# Patient Record
Sex: Male | Born: 1935 | Hispanic: No | Marital: Married | Smoking: Former smoker
Health system: Southern US, Community
[De-identification: ages and names within clinical notes are randomized; demographics above are authoritative.]

## PROBLEM LIST (undated history)

## (undated) DIAGNOSIS — R569 Unspecified convulsions: Secondary | ICD-10-CM

## (undated) DIAGNOSIS — F329 Major depressive disorder, single episode, unspecified: Secondary | ICD-10-CM

## (undated) DIAGNOSIS — I2510 Atherosclerotic heart disease of native coronary artery without angina pectoris: Secondary | ICD-10-CM

## (undated) DIAGNOSIS — F32A Depression, unspecified: Secondary | ICD-10-CM

## (undated) DIAGNOSIS — I639 Cerebral infarction, unspecified: Secondary | ICD-10-CM

## (undated) DIAGNOSIS — F0150 Vascular dementia without behavioral disturbance: Secondary | ICD-10-CM

## (undated) DIAGNOSIS — E119 Type 2 diabetes mellitus without complications: Secondary | ICD-10-CM

## (undated) HISTORY — PX: CARDIAC SURGERY: SHX584

---

## 2005-05-24 ENCOUNTER — Ambulatory Visit: Payer: Self-pay | Admitting: Internal Medicine

## 2005-06-03 ENCOUNTER — Ambulatory Visit: Payer: Self-pay | Admitting: Internal Medicine

## 2005-07-01 ENCOUNTER — Ambulatory Visit: Payer: Self-pay | Admitting: Internal Medicine

## 2006-06-22 ENCOUNTER — Ambulatory Visit: Payer: Self-pay | Admitting: Internal Medicine

## 2006-07-08 ENCOUNTER — Other Ambulatory Visit: Payer: Self-pay

## 2006-07-08 ENCOUNTER — Ambulatory Visit: Payer: Self-pay | Admitting: Cardiology

## 2006-07-29 ENCOUNTER — Ambulatory Visit: Payer: Self-pay | Admitting: Internal Medicine

## 2006-08-15 ENCOUNTER — Ambulatory Visit: Payer: Self-pay | Admitting: Internal Medicine

## 2006-09-08 ENCOUNTER — Ambulatory Visit: Payer: Self-pay

## 2006-09-15 ENCOUNTER — Ambulatory Visit: Payer: Self-pay | Admitting: Internal Medicine

## 2006-09-19 ENCOUNTER — Ambulatory Visit: Payer: Self-pay | Admitting: Unknown Physician Specialty

## 2006-11-11 ENCOUNTER — Ambulatory Visit: Payer: Self-pay | Admitting: Gastroenterology

## 2007-06-20 ENCOUNTER — Ambulatory Visit: Payer: Self-pay | Admitting: Internal Medicine

## 2007-10-22 ENCOUNTER — Inpatient Hospital Stay (HOSPITAL_COMMUNITY): Admission: EM | Admit: 2007-10-22 | Discharge: 2007-10-25 | Payer: Self-pay | Admitting: Emergency Medicine

## 2007-10-22 ENCOUNTER — Encounter (INDEPENDENT_AMBULATORY_CARE_PROVIDER_SITE_OTHER): Payer: Self-pay | Admitting: Neurology

## 2007-10-23 ENCOUNTER — Encounter (INDEPENDENT_AMBULATORY_CARE_PROVIDER_SITE_OTHER): Payer: Self-pay | Admitting: Neurology

## 2007-10-24 ENCOUNTER — Ambulatory Visit: Payer: Self-pay | Admitting: Physical Medicine & Rehabilitation

## 2007-10-25 ENCOUNTER — Inpatient Hospital Stay (HOSPITAL_COMMUNITY)
Admission: RE | Admit: 2007-10-25 | Discharge: 2007-11-15 | Payer: Self-pay | Admitting: Physical Medicine & Rehabilitation

## 2007-10-25 ENCOUNTER — Ambulatory Visit: Payer: Self-pay | Admitting: Cardiovascular Disease

## 2007-12-15 ENCOUNTER — Encounter
Admission: RE | Admit: 2007-12-15 | Discharge: 2007-12-18 | Payer: Self-pay | Admitting: Physical Medicine & Rehabilitation

## 2007-12-18 ENCOUNTER — Ambulatory Visit: Payer: Self-pay | Admitting: Physical Medicine & Rehabilitation

## 2008-12-25 ENCOUNTER — Encounter: Payer: Self-pay | Admitting: Neurology

## 2009-01-01 ENCOUNTER — Encounter: Payer: Self-pay | Admitting: Neurology

## 2009-01-31 ENCOUNTER — Encounter: Payer: Self-pay | Admitting: Neurology

## 2009-03-03 ENCOUNTER — Encounter: Payer: Self-pay | Admitting: Neurology

## 2009-04-02 ENCOUNTER — Encounter: Payer: Self-pay | Admitting: Neurology

## 2009-05-03 ENCOUNTER — Encounter: Payer: Self-pay | Admitting: Neurology

## 2009-06-03 ENCOUNTER — Encounter: Payer: Self-pay | Admitting: Neurology

## 2009-07-01 ENCOUNTER — Encounter: Payer: Self-pay | Admitting: Neurology

## 2010-07-27 ENCOUNTER — Emergency Department: Payer: Self-pay | Admitting: Emergency Medicine

## 2010-09-15 NOTE — Assessment & Plan Note (Signed)
[facility]   NAME:  Chad [name], Chad [name]                ACCOUNT NO.:  [id number]   MEDICAL RECORD NO.:  [id number]      DATE OF BIRTH:  03/03/1934   PHYSICIAN:  Noralyn Pick. Eden Emms, MD, [facility]    VISIT DATE:                                   OFFICE VISIT   Transesophageal electrocardiogram.   INDICATIONS:  A 77 year old patient with TIA, CVA, rule out source of  embolus.   The patient was sedated with 6 mg of Versed and 25 mcg of fentanyl.   Using digital technique and Omniplane, probe was advanced in the distal  esophagus without incident.   Transgastric imaging was revealed normal LV function with an EF of 60%.  There is moderate  LVH.   Mitral valve was mildly thickened with  trivial mitral insufficiency.  Aortic valve was trileaflet without significant disease.  There is no  aortic stenosis. Left atrial appendage was well visualized.  There is no  source of embolus.  There was some left ventricular hypertrophy with  atrial septum.   Right-sided cardiac chambers were normal.  A bubble study was negative  for right or left shunt.   Imaging of the aorta showed no significant debris.   FINAL IMPRESSION:  1. No source of embolus.  2. Negative blood loss study with no right or left shunt.  3. No left atrial appendage thrombus.  4. Moderate left ventricular hypertrophy, ejection fraction 60%.  5. Mild aortic valve sclerosis.  6. Trivial mitral insufficiency.      Noralyn Pick. Eden Emms, MD, [facility]  Electronically Signed     PCN/MEDQ  D:  10/25/2007  T:  10/26/2007  Job:  [id number]

## 2010-09-15 NOTE — Discharge Summary (Signed)
NAMEWINTON, Chad [name]                ACCOUNT NO.:  [id number]   MEDICAL RECORD NO.:  [id number]          PATIENT TYPE:  INP   LOCATION:  3013                         FACILITY:  [facility]   PHYSICIAN:  Pramod P. Pearlean Brownie, MD    DATE OF BIRTH:  1933/09/04   DATE OF ADMISSION:  10/22/2007  DATE OF DISCHARGE:  10/25/2007                               DISCHARGE SUMMARY   DISCHARGE DIAGNOSES:  1. 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  2. Dyslipidemia.  3. Diabetes.  4. Hypertension.  5. Mild obesity.   DISCHARGE MEDICATIONS:  1. Finasteride 5 mg a day.  2. Lovastatin 40 mg a day.  3. Metformin 500 mg b.i.d.  4. BuSpar 10 mg b.i.d.  5. Effexor 75 mg b.i.d.  6. Plavix 75 mg a day.  7. Lovenox 40 mg subcu a day.   STUDIES PERFORMED:  1. CT of the brain on admission shows no acute abnormality.  2. Chest x-ray shows no acute cardiopulmonary findings, borderline      cardiac enlargement, prominent paratracheal soft tissue density      bilaterally, likely ectatic vasculature.  3. MRI of the brain shows acute to partially hemorrhagic      transformation, right periopercular infarct with extension into the      right posterior frontal parietal lobe, small area of hemorrhage      right frontal lobe separated from the infarct, small vessel disease      type changes.  4. MRA of the head shows decreased visualization of the right middle      cerebral artery branch vessels consistent with acute infarct,      irregularity of intracranial vasculature consistent with      intracranial atherosclerotic type changes.  5. Followup CT of the head on the 22nd shows nonhemorrhagic infarct in      the right frontal parietal region as described with slight edema      and minimal mass effect on the right lateral ventricle.  No visible      hemorrhage or new infarcts.  6. Carotid Doppler shows bilateral 40%-60% ICA stenosis  by velocity      only.  A 2-D echocardiogram shows EF of 65%-70% with no obvious      source of embolus.  7. Transesophageal echocardiogram to be done on day of discharge,      results pending.  8. EKG, not present in chart.   LABORATORY STUDIES:  Hemoglobin 12.3, hematocrit 36.8, cholesterol 128,  triglycerides 39, HDL 35, LDL 85.  Urinalysis with 3-6 white blood  cells, 11-20 red blood cells, moderate leukocyte esterase.  Urine  culture, no growth.  Homocystine 14.6.  Urine drug screen negative.  Alcohol level, less than 10.  INR on admission 0.9.  Chemistry with  glucose 189, otherwise normal.  Liver function tests normal.  Hemoglobin  A1c 6.8.   HISTORY OF PRESENT ILLNESS:  [name] is a 77 year old right-  handed Caucasian male who states he laid down  at 3 p.m. on the day of  admission not feeling well.  He was aware about exact details and stated  that he was not able to sleep.  He laid on his bed for an hour and did  not talk to anyone.  At 4 p.m. when he tried to get up, he fell down and  noted he was weak on his left side.  He subsequently bumped his forehead  and tried getting up again this time falling and hitting the back of his  head.  He clearly had slurred speech and left-sided weakness.  He was  brought by EMS to [facility] where a code stroke was called en route.  Since arrival, he underwent a CT of the brain which showed no acute  abnormalities.  TPA was discussed with wife as it was beyond the 3-hour  window.  After due consideration, we will give IV tPA with the knowledge  that the patient does have 2 small scalp hematomas but it is not felt he  would likely break from that.  He was given full-dose IV tPA and  admitted to the neurointensive care for further evaluation.   HOSPITAL COURSE:  The patient remained stable throughout 24 hours in ICU  and was then transferred to the floor.  Of note, 24-hour post CT after  tPA showed no hemorrhage, though there was  a small amount of hemorrhagic  transformation seen on gradient echo on MRI.  As no hemorrhage was seen  on CT, the patient was started on Plavix for secondary stroke  prevention.  The patient also has a abnormal UA on admission, but urine  culture was negative.  He has vascular risk factors of hypertension,  diabetes, and dyslipidemia, all which are under good control.  He was  able to tolerate a dysphagia through thin liquid diet.  He did have some  significant left hemiparesis requiring rehab.  PT, OT, and speech  recommended inpatient rehab and arrangements were made.   CONDITION ON DISCHARGE:  The patient was alert and oriented x3.  No  aphasia, mild dysarthria, left facial weakness, left upper extremity  flaccid plegia, left lower extremity 3/5 decreased sensation on the  left.   DISCHARGE PLAN:  1. Transfer to rehab for continuation of PT, OT, and speech therapy.  2. TEE the day of discharge for follow up results.  We will likely      need outpatient TCD and emboli monitoring and Doppler studies at      office after discharge as well as outpatient cardiac monitoring by      cardiologist.  3. Follow up primary care physician within 1 month after discharge.      Please arrange cardiac monitoring with cardiologist as recommended.  4. Follow up Dr. Delia Heady in his office in 2-3 months.  5. Continue Plavix for secondary stroke prevention.      Annie Main, N.P.    ______________________________  Sunny Schlein. Pearlean Brownie, MD    SB/MEDQ  D:  10/25/2007  T:  10/26/2007  Job:  [id number]   cc:   Pramod P. Pearlean Brownie, MD  Daniel Nones, M.D.

## 2010-09-15 NOTE — H&P (Signed)
Chad [name], Chad [name]                ACCOUNT NO.:  [id number]   MEDICAL RECORD NO.:  [id number]          PATIENT TYPE:  INP   LOCATION:  3103                         FACILITY:  [facility]   PHYSICIAN:  Pramod P. Pearlean Brownie, MD    DATE OF BIRTH:  Dec 16, 1933   DATE OF ADMISSION:  10/22/2007  DATE OF DISCHARGE:                              HISTORY & PHYSICAL   REFERRING PHYSICIAN:  Devoria Albe, MD   REASON FOR REFERRAL:  Code stroke.   HISTORY OF PRESENT ILLNESS:  Chad [name] is a 77 year old pleasant  Caucasian male, who states he laid down at around 3 p.m. today, as he  was not feeling well.  He was vague about exact details, but stated that  he was not able to sleep because he was not well.  He lay on his bed for  an hour, did not talk to anybody.  At 4 o'clock when he tried to get up,  he fell down and noted he was weak on the left side.  He bumped his  forehead and subsequently, he tried getting up again, this time fell on  the back of his head.  He clearly had slurred speech and left-sided  weakness.  He was brought by EMS to [facility] where a code  stroke was called en route.  Since arrival, he has undergone a CT scan  of head, which has been unremarkable.  His slurred speech and left-sided  weakness have persisted.  He has no known prior history of stroke, TIA,  seizures, or significant neurological problems.   PAST MEDICAL HISTORY:  1. Diabetes.  2. Hyperlipidemia.  3. Hypertension.   HOME MEDICATIONS:  Include Mevacor, Proscar, BuSpar, Effexor,  Glucophage, and aspirin.   PAST SURGICAL HISTORY:  None.   MEDICATION ALLERGIES:  None.   SOCIAL HISTORY:  The patient is retired, lives with the relatives.  Does  not smoke or drink.   PRIMARY PHYSICIAN:  Curtis Sites, MD in [place].   REVIEW OF SYSTEMS:  Negative for recent fever, cough, chest pain,  diarrhea, or illness.   PHYSICAL EXAMINATION:  GENERAL:  Pleasant, elderly Caucasian gentleman,  who is not in  distress.  Afebrile.  VITAL SIGNS:  Pulse rate is 102 per minute, regular; blood pressure is a  162/110; respiratory rate is 18 per minute; and sats are 99% on 2 L.  Distal pulses are well felt.  HEENT:  Head exam reveals a small scalp hematoma on the right frontal  region as well as left occipital region.  There is no active bleeding at  the sight.  CARDIAC:  Regular heart sounds.  No murmur or gallop.  LUNGS:  Clear to auscultation.  ABDOMEN:  Soft and nontender.  NEUROLOGIC:  The patient is awake, alert, and oriented to time, place  and person.  There is no aphasia.  He has marked dysarthria, but it can  be understood.  He has significant left lower facial weakness.  His  tongue is midline.  Motor system exam reveals no upper extremity drift;  however, he has significant weakness of left  grip and intrinsic hand  muscles as well as left finger-to-nose dysmetria.  He has a mild left  lower extremity drift.  He has preserved touch and pinprick sensation,  but has a mild sensory extinction on the left.  His gait was not tested.   LABORATORY DATA:  Reviewed noncontrast CAT scan on the head, appears  unremarkable without any early signs of stroke or hemorrhage.  Basic  metabolic panel lab is significant only for glucose of 172.   IMPRESSION:  A 77 year old gentleman with sudden onset of left-sided  weakness and slurred speech, exact time of onset sometime between 3 and  4, but still within 3 to 4-1/2 hours from his ictus.  He will qualify  for off label use of IV tPA within the 3 to 4-hour time frame.  He does  have significant neurological deficit to justify using the medication.   PLAN:  I had a long discussion with the patient, his wife, and family  members with regards to the risk and benefit of giving IV tPA off label  in the 3 to 4-hour time frame as per the recent American Stroke  Association guidelines.  I explained to them, the patient does have 7%  risk of intracerebral  hemorrhage, but if he does not get better, he is  likely to have persistent disability.  The family after due  consideration has decided to go ahead with IV tPA.  The patient does  have two small scalp hematomas, but I do not think it is likely to bleed  from that.  We have explained this clearly to the family and they are  still in agreement.  We will give IV tPA full dose and admit the patient  to the Neurologic Intensive Care Unit where his blood pressure will be  tightly controlled as per post tPA protocol.  The patient is critically  ill, at significant risk for worsening, and his care requires complex  decision-making and monitoring of hemodynamic and neurological status.  I spent 1-hour of critical care time in the patient's care, treated the  patient.  We will get MRI scan of the brain, telemetry monitoring,  carotid Doppler, 2-D echocardiogram, fasting lipid profile, hemoglobin  A1c, and homocysteine.  Physical, occupational and speech therapy  consults, and rehab consults if necessary.           ______________________________  Sunny Schlein. Pearlean Brownie, MD     PPS/MEDQ  D:  10/22/2007  T:  10/23/2007  Job:  [id number]   cc:   Curtis Sites, MD

## 2010-09-15 NOTE — H&P (Signed)
NAMEMarland Kitchen  DEQUANN, VANDERVELDEN NO.:  [id number]   MEDICAL RECORD NO.:  [id number]          PATIENT TYPE:  IPS   LOCATION:  4025                         FACILITY:  [facility]   PHYSICIAN:  Ranelle Oyster, M.D.DATE OF BIRTH:  1934/01/10   DATE OF ADMISSION:  10/25/2007  DATE OF DISCHARGE:                              HISTORY & PHYSICAL   CHIEF COMPLAINT:  Left-sided weakness.   HISTORY OF PRESENT ILLNESS:  This is a 77 year old white male with  diabetes and BPH, admitted on October 22, 2007, with problems walking and a  fall.  He also developed left-sided weakness and slurred speech.  Head  CT was without acute changes, and the patient was treated with TPA for a  right subcortical infarct.  MRI/MRA of the brain showed an acute  infarct, partially hemorrhagic in right frontal-parietal lobe and  intracranial atherosclerosis.  The patient had some interval worsening  of his left hemiparesis due to hemorrhagic transformation. Followup head  CT on October 23, 2007, was without new hemorrhage.  A 2-D echo was  unremarkable.  Carotid Dopplers revealed bilateral 40%-60% ICA stenosis.  The patient was started on Plavix for stroke prophylaxis.  TEE was done  on October 25, 2007, workup, right MCA branch infarct with question of  embolic source.   PT and OT have been ongoing.  The patient continues to have poor balance  and needing cues to sit and transfer.  He is requiring steady assistance  with self-care as well.  Speech therapy has evaluated the patient, and  BS revealed poor oral control of bolus with late swallow secondary to  decreased sensation and recommended D2 thin liquid diet.  Because of the  above medical and functional issues, the patient is admitted to the  rehab unit today to receive comprehensive care.   REVIEW OF SYSTEMS:  Notable for the above.  He has had some bowel and  bladder incontinence.  He stated he last emptied his bladder this  morning.  He has ongoing anxiety  and depression.  Other pertinent  positives are above and full reviewed and written H&P.   PAST HISTORY:  Positive for sinus surgery, for a headache 2 years ago,  type 2 diabetes, BPH, dyslipidemia, anxiety x20 years, depression, and  GERD.   FAMILY HISTORY:  Positive for diabetes.   SOCIAL HISTORY:  The patient is married.  He is a retired [employer],  living in a one-level house with 3 steps to enter.  He has not smoked  since his 45s and does not drink.  Family will assist upon discharge.   FUNCTIONAL HISTORY:  The patient is independent, driving prior to  arrival.  Currently, the patient is requiring moderate assistance for  basic transfers, self-care, balance, and mobility.   ALLERGIES:  None.   HOME MEDICATIONS:  1. Mevacor.  2. Proscar.  3. BuSpar.  4. Effexor.  5. Glucophage.  6. Aspirin.  7. Prilosec.   LABS:  Hemoglobin 12.8, white count 5.7, platelets 193,000.  Sodium 141,  potassium 3.9, BUN and creatinine 23 and 1.09.   PHYSICAL EXAMINATION:  VITAL SIGNS:  Blood pressure is 135/74, pulse 71,  respiratory rate 20, and temperature 97.2.  GENERAL:  The patient is pleasant, alert, and oriented x3 with extra  time.  HEENT:  Pupils are equal, round, and reactive to light.  Ear, nose, and  throat exam is notable for fair dentition and pink moist mucosa.  NECK:  Supple without JVD and lymphadenopathy.  CHEST:  Clear to auscultation bilaterally without wheezes, rales, or  rhonchi.  HEART:  Regular rate and rhythm without murmur, rubs, or gallops.  EXTREMITIES:  Show no clubbing, cyanosis.  Only 1+ edema.  Some bruising  in the left upper extremity today.  ABDOMEN:  Distended with bowel sounds positive.  He is nontender.  NEUROLOGIC:  Cranial nerves II through XII revealed left central VII.  He also was dysarthric with speech and decreased tongue control overall.  Phonation was poor also.  Reflexes are 1+ to 2+ on the left side today.  Sensation is decreased at  trace out of 2 left upper extremity, 1/2 left  lower extremity today.  Left face was also 1/2.  Motor function was 4/5  to 5/5 right upper and lower extremities today.  Left upper extremity is  trace at the shoulder and elbow, 0 at the wrist and hand.  Left lower  extremity is 1+/5 to 2-/5 at the knee and ankle today.  It may have had  1+ at the hip as well.  The patient tended to have a right gaze  preference today.  Judgment was fair as was orientation and memory.  Mood was very flat, although the patient appeared fatigued.   ASSESSMENT AND PLAN:  1. Functional deficits secondary to right middle cerebral artery      infarct with partial hemorrhagic transformation post tissue      plasminogen activator.  The patient has left hemiparesis, upper      greater than lower with dysarthria, left hemisensory loss, left      inattention, left-sided facial weakness with dysphagia and      dysarthria.  The patient is admitted to the inpatient rehab unit      today to receive comprehensive collaborative care between      physiatrist, rehab nursing, and therapy team.  The patient will      receive 24-hour a day medical coverage from the rehab physician to      address ongoing acute medical needs and their effect upon the      patient's rehab progression.  A 24-hour rehab nurse will follow the      patient for ongoing bowel and bladder incontinence issues as well      as skin care, nutrition, safety, etc.  They also will integrate      therapy concepts/plan.  PT will assess and treat the patient for      range of motion, strengthening, appropriate safety, transferring,      and equipment as well as family education.  OT will assess and      treat for range of motion and strengthening, ADLs,      cognitive/perceptual training, and appropriate equipment.  Speech      language pathology will assess and treat for dysphagia, dysarthria,      and cognitive screen.  Rehab case manager/social worker will  assess      for psychosocial needs and discharge planning.  The patient will      receive on average at least 3 hours a day of therapy, 5 days  a      week.  Team conferences will be held weekly.  Goals are supervision      to min assist with mobility and self-care.  Prognosis good.      Estimated length of stay is 2+ weeks.  2. Diabetes:  Continue Glucophage b.i.d. and checking postprandial and      nighttime sugars.  Cover with sliding scale insulin as appropriate,      attempting to avoid excessive hyper or hypoglycemia episodes.  The      patient's dietary intake may fluctuate effectiveness as well.  3. Dyslipidemia:  Zocor at bedtime.  4. Stroke prophylaxis:  Continue Plavix.  Observe for signs or      symptoms of further hemorrhagic transformation.  5. Benign prostatic hyperplasia with occasional incontinence:  The      patient wears pads at home.  We will observe the urinary output and      post-void residuals and address appropriately.  6. Deep venous thrombosis prophylaxis:  Subcu Lovenox.  No signs or      symptoms currently of thrombosis.  7. Constipation:  No bowel movement since Sunday.  Add Senokot-S      suppository.  8. Left shoulder/arm pain.  We will keep the patient in appropriate      positioning to avoid subluxation of the shoulder and further      worsening of pain.  I saw no focal signs of shoulder tendinitis      today, but we will monitor.      Ranelle Oyster, M.D.  Electronically Signed     ZTS/MEDQ  D:  10/25/2007  T:  10/26/2007  Job:  [id number]

## 2010-09-15 NOTE — Discharge Summary (Signed)
NAMEMarland Kitchen  Chad, [name] NO.:  [id number]   MEDICAL RECORD NO.:  [id number]          PATIENT TYPE:  IPS   LOCATION:  4032                         FACILITY:  [facility]   PHYSICIAN:  Ranelle Oyster, M.D.DATE OF BIRTH:  Feb 20, 1934   DATE OF ADMISSION:  10/25/2007  DATE OF DISCHARGE:  11/15/2007                               DISCHARGE SUMMARY   DISCHARGE DIAGNOSES:  1. 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  2. Diabetes mellitus type 2.  3. Dyslipidemia.  4. Bilateral carotid artery stenosis.  5. Benign prostatic hyperplasia.  6. History of depression.   HISTORY OF PRESENT ILLNESS:  Chad [name] is a 77 year old male with  history of diabetes mellitus, BPH, admitted on October 22, 2007, with  inability to walk with fall and left-sided weakness with slurred speech.  CT of head showed no acute changes.  The patient was treated with tPA  for right subcortical infarct.  MRI and MRA of brain showed acute  infarct partially hemorrhagic right frontal to parietal lobe and  intracranial atherosclerosis.  The patient had some worsening of left  hemiparesis secondary to hemorrhagic transformation.  Followup CCT on  October 23, 2007, shows no new hemorrhage.  A 2-D echo done showed EF of 65-  75.  Carotid Doppler showed bilateral 40-60% ICA stenosis.  The patient  was started on Plavix for CVA prophylaxis.  A TEE was done on October 25, 2007, to workup right MCA branch infarct as a question of embolic  source.  TEE done showed EF of 60%.  No source of embolus.  No ASD.  No  LAA clot.  Currently, the patient continues with left hemiparesis.  He  has complaints of left shoulder and left elbow discomfort with range of  motion.  He is noted to be mod assist for transfers, total assist  60%  for step pivot transfers and for shifting on the right to allow for left  lower extremity advancement.  Noticed to have tendency to lean to the  left with fatigue.  Swallow eval done showed the patient with poor oral  control of bolus with delayed swallow and the patient on D2 diet with  thin liquids.  Secondary to impairments in mobility and self-care.  Rehab was consulted for further therapies.   PAST MEDICAL HISTORY:  Significant for;  1. Sinus surgery due to headaches approximately 2 years ago.  2. DM type 2.  3. BPH.  4. Dyslipidemia.  5. Anxiety x20 years.  6. History of depression with mood swings and GERD.   ALLERGIES:  No known drug allergies.   FAMILY HISTORY:  Positive for diabetes.   SOCIAL HISTORY:  The patient is married.  He is a retired [employer] architect  living in one-level home with 3 steps to entry.  He has 15-pack tobacco  use history, quit in 60s.  Does not  use any alcohol.   FUNCTIONAL HISTORY:  The patient was independent and driving prior to  admission.  He was independent without use of assist device.  Currently,  the patient is requiring moderate assist for basic transfers, self-care  balance, and mobility.   PHYSICAL EXAMINATION:  GENERAL:  At admission showed the patient to be a  pleasant, alert, oriented x3 with some extra time.  HEENT:  Showed pupils equal, round, and reactive to light.  ENT exam was  notable for fair dentition and moist pink mucosa.  NECK:  Supple without JVD or lymphadenopathy.  CHEST:  Clear to auscultation bilaterally without wheezes, rales, or  rhonchi.  HEART:  Regular rate rhythm without murmurs or rubs.  EXTREMITIES:  Showed some 1+ edema.  No clubbing or cyanosis.  Some  bruising, left upper extremity.  ABDOMEN:  Soft and nontender with positive bowel sounds.  NEUROLOGIC:  Cranial nerves 2 through 12 revealed left central VII.  He  was noted to have dysarthric speech with decreased tongue control  overall.  Phonation  was poor.  Reflexes 1+ and 2+ on left side.  Sensation decreased to trace out of 2 for left upper, 1-2 for left lower  extremity.  Left face was at 1-2 sensation.  Motor function was 4/5-5/5  right upper and lower extremity.  Left upper extremities, trace shoulder  and elbow 0 at wrist and hand.  Left lower extremity is 1+/5-2-/5 at  knee and ankle.  Probably 1+ at hip as well.  The patient tended to have  right gaze preference.  Judgment was fair as well as orientation and  memory fair.  Mood was flat.   HOSPITAL COURSE:  Chad [name] was admitted to rehab on October 25, 2007, for inpatient therapies to consist of PT, OT, and speech therapy  at least 3 hours a day, 5 days a week.  Nursing has been working the  patient regarding toileting and bowel and bladder program.  At time of  admission, the patient with complaints of constipation and he was  started on bowel program.  He was also set up on a routine toileting  schedule to help with continence.  The patient has been continent of  bowel and bladder during this stay.  Labs were done at admission  revealing hemoglobin 13.3, hematocrit 39.2, white count 6.5, and  platelets 215.  Check of lytes revealed sodium 139, potassium 4.8,  chloride 103, CO2 27, BUN 22, creatinine 1, and glucose 106.  The  patient's blood pressures were monitored on b.i.d. basis during this  stay.  These have been reasonably controlled ranging from 120s-130s  systolic, 60s-70s diastolic.  CBG checks were done at a.c. and h.s.  basis initially.  The patient's p.o. intake has been good.  As blood  sugars reasonably controlled from 100-123 range, CBG checks were cut  down to b.i.d. rotating basis and blood sugars currently ranging from  90s to 120s.  The patient has had some complaints of left shoulder, left  upper extremity pain, which is relieved with positioning as well as  elevation when in bed and chair.  Speech therapy has been following the  patient for  dysphasia.  Most recently, the patient was advanced to  regular diet with intermittent supervision on November 09, 2007.  Check of  lytes from November 10, 2007, shows renal status stable with sodium 141,  potassium 3.7, chloride 106, CO2 28, BUN 22, creatinine 1, and glucose  110.  Therapy  has been ongoing during this stay.  Initially at time of  admission, the patient required max to total assist for ADLs with mod  max assist for sitting balance and for standing setup plus to shave.  OT  has been working with the patient to focus on independence with self  feeding with attention to the left with as well as endurance and balance  issues.  The patient has been progressing along to being at supervision  to sit to stand at sink.  Active assisted use of left upper extremity.  OT use at min assist with toilet to shower transfers.  OT has been  focusing on the left upper extremity weightbearing during bathing with  adaptive bathing and dressing techniques.  The patient is noted to have  some active extension, flexion of left hand with decreased sensation  during this task and movement of left fifth finger.  The PT evaluation  at admission showed the patient had +2 total assist for squat pivot  transfers of patient less than 25%, required cuing to correct sitting  and standing with use of mirror able to stand in parallel bars with max  assist, able to take one-step at the left lower extremity with  unsteadiness of left knee and left-sided inattention neglect noted.  In  physical therapy, the patient has progressed along from total assist,  transfers to min assist for all transfers.  He is not able to ambulate  up to 200 feet with min to mod assist with platform rolling walker.  He  is improving.  His left upper extremity neglect with ability to remember  during transfers, noted to have improvement in safety awareness since  admit.  He is at min assist with car transfers.  Speech therapy  evaluation  revealed the patient requiring min assist with occasional  cues for strategy in conversational discourses.  He was noted to have  print anticipatory awareness and required assistance to utilize these  strategies.  At time of discharge, the patient is at supervision to use  speech strategies, supervision for demonstrating anticipatory awareness,  as requires mod assist for functional problem-solving ADL tasks.  He did  have supervision to attend to left field given functional tasks.  Currently, due to amount of assistance needed, family has elected on  progressive therapies at SNF level.  Bed is available at [facility]  for November 15, 2007, and the patient to be discharged to this facility  with progressive PT, OT, speech therapy to continue past discharge.  On  November 15, 2007, the patient is discharged to SNF.   MEDICINES AT DISCHARGE:  1. Protonix 40 mg a day.  2. Effexor 37.5 mg b.i.d.  3. Senokot-S 2 p.o. q.h.s.  4. BuSpar 10 mg b.i.d.  5. Proscar 5 mg a day.  6. Zocor 20 mg q.p.m.  7. Glucophage 500 mg b.i.d.  8. Plavix 75 mg a day.  9. Tylenol 500 mg p.o. q.8 h.  10.Trazodone 50 mg p.o. q.h.s.  11.Neurontin 100 mg p.o. q.h.s.   DIET:  Carb modified high with meds whole in pureed.  Activity level is  25, supervision assistance.   SPECIAL INSTRUCTIONS:  Progressive PT, OT, and speech therapy to  continue past discharge.  Continue to monitoring CBGs on b.i.d. levels  for diabetes monitoring.  Continue toileting patient q.4 h while awake.  Routine pressure relief measures.  Keep left upper extremity elevated  when in bed and chair to prevent subluxation.  Progressive PT, OT,  and  speech therapy.   FOLLOWUP:  The patient to follow up with LMD for medical issues.  Follow  up with Dr. Riley Kill in 4-6 weeks.  Follow up with Dr. Pearlean Brownie in 6 weeks.      Greg Cutter, P.A.      Ranelle Oyster, M.D.  Electronically Signed    PP/MEDQ  D:  11/14/2007  T:  11/15/2007   Job:  [id number]   cc:   Duke Salvia, MD, [facility]  Pramod P. Pearlean Brownie, MD

## 2010-09-15 NOTE — Assessment & Plan Note (Signed)
Chad [name] is back regarding his right MCA stroke with hemorrhagic  transformation.  He was discharged to [facility].  He is heading home this week apparently.  His biggest complain is left  arm but still is weak.  His mood has been up and down, but not much  beyond his baseline.  His gait is improving.  He is going to live with  his son and daughter.   REVIEW OF SYSTEMS:  Notable for trouble walking, depression, and left  upper extremity swelling.  He denies pain.  He uses a sling for  elevation during the day.   SOCIAL HISTORY:  The patient is going to live with his son.  He is  married, but he and his wife do not particularly see eye to eye  apparently.   PHYSICAL EXAMINATION:  VITAL SIGNS:  Blood pressure is 134/43, pulse 73,  respiratory rate 18, and he is sating 96% on room air.  GENERAL:  The patient is pleasant, alert, and oriented x3.  Affect is  bright and appropriate.  HEART:  Regular rate and rhythm.  LUNGS:  Clear.  ABDOMEN:  Soft and nontender.  EXTREMITIES:  Left upper extremity has 1+ edema distally particularly at  the wrist and hands.  He has some pain with passive movements.  Cranial  nerve exam reveals mild dysarthria, no facial sensory loss, but he does  have some mild left-sided facial weakness.  I saw no visual field  deficits.  He has no obvious visual or facial problems today.  Coordination is fair.  He does walk with slightly wide-based gait and  uses his prong cane for support.  He had no problems with weight shift  or changing directions today.  Reflexes are 2+ in the left and 1+ in the  right.  Left upper extremity strength is trace at the hand, 1+ to 2 at  the elbow, 1+ to 2 at the shoulder today.  Left lower extremity strength  is good at 4/5.  He still has some proprioception problems and decreased  sensation 1/2 in left arm and leg today.  Cognitively, he is intact,  good insight and awareness etc.   ASSESSMENT:  1. Right middle  cerebral artery stroke.  2. Diabetes.  3. Dependent edema in left upper extremity.   PLAN:  1. We would like to see the patient to move to an outpatient PT and OT      to work on gait and balance.  I also think he can improve  his      edema with massage, stimulation, compression, and other protective      maneuvers.  Recommended Isotoner glove to wear at home.  He should      wear a sling while he is up, moving around, but otherwise, during      the day he should have his arm out of the sling and support it on      his chair.  He will be a good candidate for Bioness, encouraged      increased left upper extremity use.  2. I think he could potentially drive a car in long term as well.  One      concern with driving would be his visual-spatial deficits, although      he has shown a lot of improvement there.  3. I will see him back in 6 weeks.     Ranelle Oyster, M.D.  Electronically Signed    ZTS/MedQ  D:  12/18/2007 10:54:32  T:  12/19/2007 01:57:37  Job #:  [id number]   cc:   Curtis Sites, M.D.

## 2011-01-28 LAB — POCT I-STAT, CHEM 8
Calcium, Ion: 1.14
Glucose, Bld: 172 — ABNORMAL HIGH
HCT: 39
Hemoglobin: 13.3

## 2011-01-28 LAB — URINALYSIS, ROUTINE W REFLEX MICROSCOPIC
Bilirubin Urine: NEGATIVE
Glucose, UA: NEGATIVE
Glucose, UA: NEGATIVE
Hgb urine dipstick: NEGATIVE
Protein, ur: NEGATIVE
Protein, ur: NEGATIVE
pH: 6

## 2011-01-28 LAB — COMPREHENSIVE METABOLIC PANEL
ALT: 12
Albumin: 3.5
Alkaline Phosphatase: 70
Alkaline Phosphatase: 79
BUN: 23
Glucose, Bld: 189 — ABNORMAL HIGH
Potassium: 3.9
Potassium: 4.8
Sodium: 139
Total Bilirubin: 0.5
Total Protein: 6
Total Protein: 6

## 2011-01-28 LAB — URINE MICROSCOPIC-ADD ON

## 2011-01-28 LAB — CBC
HCT: 36.8 — ABNORMAL LOW
Hemoglobin: 12.8 — ABNORMAL LOW
Hemoglobin: 13.3
MCHC: 33.5
Platelets: 193
Platelets: 195
Platelets: 215
RDW: 13.2
RDW: 13.3
WBC: 8.8

## 2011-01-28 LAB — URINE CULTURE
Colony Count: NO GROWTH
Special Requests: NEGATIVE

## 2011-01-28 LAB — URINE DRUGS OF ABUSE SCREEN W ALC, ROUTINE (REF LAB)
Amphetamine Screen, Ur: NEGATIVE
Barbiturate Quant, Ur: NEGATIVE
Cocaine Metabolites: NEGATIVE
Creatinine,U: 98.8
Methadone: NEGATIVE

## 2011-01-28 LAB — LIPID PANEL
HDL: 35 — ABNORMAL LOW
VLDL: 8

## 2011-01-28 LAB — DIFFERENTIAL
Basophils Absolute: 0
Basophils Relative: 0
Basophils Relative: 0
Eosinophils Absolute: 0.3
Monocytes Absolute: 0.8
Monocytes Relative: 13 — ABNORMAL HIGH
Neutro Abs: 3.6
Neutrophils Relative %: 63

## 2011-01-28 LAB — BASIC METABOLIC PANEL
BUN: 22
Calcium: 9.1
GFR calc non Af Amer: >60
Glucose, Bld: 110 — ABNORMAL HIGH

## 2011-01-28 LAB — HEMOGLOBIN A1C: Hgb A1c MFr Bld: 6.8 — ABNORMAL HIGH

## 2011-01-28 LAB — APTT: aPTT: 23 — ABNORMAL LOW

## 2011-01-28 LAB — HOMOCYSTEINE: Homocysteine: 14.6

## 2011-01-28 LAB — CK TOTAL AND CKMB (NOT AT ARMC): CK, MB: 5 — ABNORMAL HIGH

## 2011-01-28 LAB — PROTIME-INR: INR: 0.9

## 2011-08-19 ENCOUNTER — Emergency Department: Payer: Self-pay | Admitting: Emergency Medicine

## 2011-09-20 ENCOUNTER — Telehealth: Payer: Self-pay | Admitting: Internal Medicine

## 2011-11-22 NOTE — Telephone Encounter (Signed)
x

## 2012-02-08 ENCOUNTER — Ambulatory Visit: Payer: Self-pay

## 2012-04-01 ENCOUNTER — Emergency Department: Payer: Self-pay | Admitting: Unknown Physician Specialty

## 2012-05-08 ENCOUNTER — Encounter: Payer: Self-pay | Admitting: Cardiothoracic Surgery

## 2012-06-03 ENCOUNTER — Encounter: Payer: Self-pay | Admitting: Cardiothoracic Surgery

## 2012-07-01 ENCOUNTER — Encounter: Payer: Self-pay | Admitting: Cardiothoracic Surgery

## 2015-11-06 ENCOUNTER — Emergency Department
Admission: EM | Admit: 2015-11-06 | Discharge: 2015-11-06 | Disposition: A | Discharge status: Home / Self Care | Payer: Medicare Other | Attending: Emergency Medicine | Admitting: Emergency Medicine

## 2015-11-06 ENCOUNTER — Emergency Department: Payer: Medicare Other

## 2015-11-06 ENCOUNTER — Encounter: Payer: Self-pay | Admitting: Emergency Medicine

## 2015-11-06 DIAGNOSIS — R519 Headache, unspecified: Secondary | ICD-10-CM

## 2015-11-06 DIAGNOSIS — Z79899 Other long term (current) drug therapy: Secondary | ICD-10-CM | POA: Insufficient documentation

## 2015-11-06 DIAGNOSIS — R5383 Other fatigue: Secondary | ICD-10-CM | POA: Insufficient documentation

## 2015-11-06 DIAGNOSIS — Z8669 Personal history of other diseases of the nervous system and sense organs: Secondary | ICD-10-CM | POA: Diagnosis not present

## 2015-11-06 DIAGNOSIS — R51 Headache: Secondary | ICD-10-CM | POA: Insufficient documentation

## 2015-11-06 DIAGNOSIS — Z87891 Personal history of nicotine dependence: Secondary | ICD-10-CM | POA: Insufficient documentation

## 2015-11-06 DIAGNOSIS — I2510 Atherosclerotic heart disease of native coronary artery without angina pectoris: Secondary | ICD-10-CM | POA: Diagnosis not present

## 2015-11-06 DIAGNOSIS — E119 Type 2 diabetes mellitus without complications: Secondary | ICD-10-CM | POA: Diagnosis not present

## 2015-11-06 DIAGNOSIS — Z8673 Personal history of transient ischemic attack (TIA), and cerebral infarction without residual deficits: Secondary | ICD-10-CM | POA: Insufficient documentation

## 2015-11-06 HISTORY — DX: Atherosclerotic heart disease of native coronary artery without angina pectoris: I25.10

## 2015-11-06 HISTORY — DX: Unspecified convulsions: R56.9

## 2015-11-06 HISTORY — DX: Major depressive disorder, single episode, unspecified: F32.9

## 2015-11-06 HISTORY — DX: Vascular dementia, unspecified severity, without behavioral disturbance, psychotic disturbance, mood disturbance, and anxiety: F01.50

## 2015-11-06 HISTORY — DX: Cerebral infarction, unspecified: I63.9

## 2015-11-06 HISTORY — DX: Depression, unspecified: F32.A

## 2015-11-06 HISTORY — DX: Type 2 diabetes mellitus without complications: E11.9

## 2015-11-06 LAB — BLOOD GAS, VENOUS
Acid-Base Excess: 4.5 mmol/L — ABNORMAL HIGH (ref 0.0–3.0)
Bicarbonate: 29.8 mEq/L — ABNORMAL HIGH (ref 21.0–28.0)
FIO2: 0.21
O2 Saturation: 87.7 %
PCO2 VEN: 46 mmHg (ref 44.0–60.0)
PH VEN: 7.42 (ref 7.320–7.430)
Patient temperature: 37
pO2, Ven: 53 mmHg — ABNORMAL HIGH (ref 31.0–45.0)

## 2015-11-06 LAB — CBC WITH DIFFERENTIAL/PLATELET
BASOS ABS: 0 10*3/uL (ref 0–0.1)
BASOS PCT: 1 %
EOS ABS: 0.1 10*3/uL (ref 0–0.7)
EOS PCT: 2 %
HCT: 38.7 % — ABNORMAL LOW (ref 40.0–52.0)
Hemoglobin: 13.6 g/dL (ref 13.0–18.0)
LYMPHS PCT: 16 %
Lymphs Abs: 1.2 10*3/uL (ref 1.0–3.6)
MCH: 32.4 pg (ref 26.0–34.0)
MCHC: 35.1 g/dL (ref 32.0–36.0)
MCV: 92.3 fL (ref 80.0–100.0)
Monocytes Absolute: 0.7 10*3/uL (ref 0.2–1.0)
Monocytes Relative: 9 %
Neutro Abs: 5.5 10*3/uL (ref 1.4–6.5)
Neutrophils Relative %: 72 %
PLATELETS: 165 10*3/uL (ref 150–440)
RBC: 4.19 MIL/uL — AB (ref 4.40–5.90)
RDW: 12.6 % (ref 11.5–14.5)
WBC: 7.6 10*3/uL (ref 3.8–10.6)

## 2015-11-06 LAB — BASIC METABOLIC PANEL
Anion gap: 5 (ref 5–15)
BUN: 29 mg/dL — AB (ref 6–20)
CALCIUM: 9.1 mg/dL (ref 8.9–10.3)
CO2: 28 mmol/L (ref 22–32)
CREATININE: 0.95 mg/dL (ref 0.61–1.24)
Chloride: 105 mmol/L (ref 101–111)
GFR calc Af Amer: >60 mL/min (ref >60)
GFR calc non Af Amer: >60 mL/min (ref >60)
GLUCOSE: 134 mg/dL — AB (ref 65–99)
Potassium: 4.1 mmol/L (ref 3.5–5.1)
Sodium: 138 mmol/L (ref 135–145)

## 2015-11-06 LAB — TROPONIN I: Troponin I: <0.03 ng/mL (ref <0.03)

## 2015-11-06 LAB — URINALYSIS COMPLETE WITH MICROSCOPIC (ARMC ONLY)
BACTERIA UA: NONE SEEN
Bilirubin Urine: NEGATIVE
Glucose, UA: 50 mg/dL — AB
Hgb urine dipstick: NEGATIVE
Ketones, ur: NEGATIVE mg/dL
Leukocytes, UA: NEGATIVE
NITRITE: NEGATIVE
PROTEIN: NEGATIVE mg/dL
SPECIFIC GRAVITY, URINE: 1.024 (ref 1.005–1.030)
pH: 5 (ref 5.0–8.0)

## 2015-11-06 LAB — GLUCOSE, CAPILLARY: GLUCOSE-CAPILLARY: 131 mg/dL — AB (ref 65–99)

## 2015-11-06 MED ORDER — SODIUM CHLORIDE 0.9 % IV BOLUS (SEPSIS)
1000.0000 mL | Freq: Once | INTRAVENOUS | Status: AC
  Administered 2015-11-06: 1000 mL via INTRAVENOUS

## 2015-11-06 NOTE — ED Provider Notes (Signed)
[facility] Emergency Department Provider Note  ____________________________________________  Time seen: Approximately 9:11 AM  I have reviewed the triage vital signs and the nursing notes.   HISTORY  Chief Complaint Fatigue and Headache   HPI Chad Patteelva J Balbi Sr. is a 82 y.o. male history of diet-controlled diabetes, right MCA stroke with left weakness residual deficit, seizure disorder on Lamictal, CAD, and vascular dementia who presents for evaluation of fatigue. The patient is accompanied by his wife who reports that this morning patient had to go to his day care and refused because he said he didn't feel well. Patient initially complained of a mild headache which is now resolved. He denies thunderclap headache, changes in vision, nausea, vomiting. HA was frontal, mild, and brief in duration. Patient does report that he feels fatigued and didn't feel like going to daycare today. The wife called EMS who checked his glucose and was in the low 200s. Patient has been off of metformin for many years now. His wife said that his glucose is checked regularly by the PCP and is usually normal. His seizures are well controlled on the lamictal and he hasn't had one in many years. Patient denies headache, chest pain, shortness breath, abdominal pain, nausea, vomiting, diarrhea, dysuria, fever. His wife reports that he was in his usual state of health yesterday evening.  Past Medical History  Diagnosis Date  . Diabetes mellitus without complication (HCC)   . Stroke (HCC)   . Seizures (HCC)   . Coronary artery disease   . Depression   . Vascular dementia     There are no active problems to display for this patient.   Past Surgical History  Procedure Laterality Date  . Cardiac surgery      Current Outpatient Rx  Name  Route  Sig  Dispense  Refill  . atorvastatin (LIPITOR) 40 MG tablet   Oral   Take 40 mg by mouth daily.         Marland Kitchen. donepezil (ARICEPT) 10 MG  tablet   Oral   Take 10 mg by mouth at bedtime.         . finasteride (PROSCAR) 5 MG tablet   Oral   Take 5 mg by mouth daily.         . isosorbide mononitrate (IMDUR) 30 MG 24 hr tablet   Oral   Take 30 mg by mouth daily.         Marland Kitchen. lamoTRIgine (LAMICTAL) 200 MG tablet   Oral   Take 200 mg by mouth daily.         . sertraline (ZOLOFT) 50 MG tablet   Oral   Take 50 mg by mouth daily.           Allergies Review of patient's allergies indicates no known allergies.  No family history on file.  Social History Social History  Substance Use Topics  . Smoking status: Former Games developermoker  . Smokeless tobacco: None  . Alcohol Use: No    Review of Systems  Constitutional: Negative for fever. + fatigue Eyes: Negative for visual changes. ENT: Negative for sore throat. Cardiovascular: Negative for chest pain. Respiratory: Negative for shortness of breath. Gastrointestinal: Negative for abdominal pain, vomiting or diarrhea. Genitourinary: Negative for dysuria. Musculoskeletal: Negative for back pain. Skin: Negative for rash. Neurological: Negative for headaches, weakness or numbness.  ____________________________________________   PHYSICAL EXAM:  VITAL SIGNS: Filed Vitals:   11/06/15 1200 11/06/15 1300  BP: 114/56 119/61  Pulse: 53  55  Resp: 16 18   Constitutional: Alert and oriented. Well appearing and in no apparent distress. HEENT:      Head: Normocephalic and atraumatic.         Eyes: Conjunctivae are normal. Sclera is non-icteric. EOMI. PERRL      Mouth/Throat: Mucous membranes are moist.       Neck: Supple with no signs of meningismus. Cardiovascular: Regular rate and rhythm. No murmurs, gallops, or rubs. 2+ symmetrical distal pulses are present in all extremities. No JVD. Respiratory: Normal respiratory effort. Lungs are clear to auscultation bilaterally. No wheezes, crackles, or rhonchi.  Gastrointestinal: Soft, non tender, and non distended with  positive bowel sounds. No rebound or guarding. Musculoskeletal: Nontender with normal range of motion in all extremities. No edema, cyanosis, or erythema of extremities. Neurologic: Slight L facial droop and 4/5 weakness in RUE (baseline). Normal speech and language. Moving all extremities.  Skin: Skin is warm, dry and intact. No rash noted. Psychiatric: Mood and affect are normal. Speech and behavior are normal.  ____________________________________________   LABS (all labs ordered are listed, but only abnormal results are displayed)  Labs Reviewed  BASIC METABOLIC PANEL - Abnormal; Notable for the following:    Glucose, Bld 134 (*)    BUN 29 (*)    All other components within normal limits  URINALYSIS COMPLETEWITH MICROSCOPIC ([facility] ONLY) - Abnormal; Notable for the following:    Color, Urine YELLOW (*)    APPearance CLEAR (*)    Glucose, UA 50 (*)    Squamous Epithelial / LPF 0-5 (*)    All other components within normal limits  CBC WITH DIFFERENTIAL/PLATELET - Abnormal; Notable for the following:    RBC 4.19 (*)    HCT 38.7 (*)    All other components within normal limits  BLOOD GAS, VENOUS - Abnormal; Notable for the following:    pO2, Ven 53.0 (*)    Bicarbonate 29.8 (*)    Acid-Base Excess 4.5 (*)    All other components within normal limits  GLUCOSE, CAPILLARY - Abnormal; Notable for the following:    Glucose-Capillary 131 (*)    All other components within normal limits  TROPONIN I  TROPONIN I  CBG MONITORING, ED   ____________________________________________  EKG  ED ECG REPORT I, Nita Sickle, the attending physician, personally viewed and interpreted this ECG.  Sinus rhythm, rate of 61, normal intervals, normal axis, no ST elevations or depressions, unchanged from prior ____________________________________________  RADIOLOGY  CT head: No acute finding Chest x-ray: No acute  finding ____________________________________________   PROCEDURES  Procedure(s) performed: None Critical Care performed:  None ____________________________________________   INITIAL IMPRESSION / ASSESSMENT AND PLAN / ED COURSE  82 y.o. male history of diet-controlled diabetes, right MCA stroke with left weakness residual deficit, seizure disorder on Lamictal, CAD, and vascular dementia who presents for evaluation of fatigue since this morning and slight HA which resolved PTA. Patient's physical exam nonfocal other than known deficits from prior stroke. Vital signs within normal limits. We'll get a head CT, EKG, CXR, basic labs, urinalysis, troponin x 2 to rule out silent stroke since patient is diabetic, and reassess. Will give IVF.  ----------------------------------------- 2:00 PM on 11/06/2015 ----------------------------------------- Workup essentially negative. UA with no evidence of infection. Head CT was normal. Troponin 2 negative. EKG nonischemic. Normal CBC and BMP. Normal blood gas. Patient received IV fluids and reports that he feels markedly improved. Tolerating by mouth. Chest x-ray with no evidence of infection. We'll discharge home  on supportive care and close follow-up with primary care doctor. Both wife and patient are comfortable with this plan.   Pertinent labs & imaging results that were available during my care of the patient were reviewed by me and considered in my medical decision making (see chart for details).    I discussed my evaluation of the patient's symptoms, my clinical impression, and my proposed outpatient treatment plan with patient/ family members. We have discussed anticipatory guidance, scheduled follow-up, and careful return precautions. The patient expresses understanding and is comfortable with the discharge plan. All patient's questions were answered.   ____________________________________________   FINAL CLINICAL IMPRESSION(S) / ED  DIAGNOSES  Final diagnoses:  Other fatigue  Acute nonintractable headache, unspecified headache type      NEW MEDICATIONS STARTED DURING THIS VISIT:  New Prescriptions   No medications on file     Note:  This document was prepared using Dragon voice recognition software and may include unintentional dictation errors.    Nita Sicklearolina Destenie Ingber, MD 11/06/15 [phone]

## 2015-11-06 NOTE — Discharge Instructions (Signed)
Fatigue  Fatigue is feeling tired all of the time, a lack of energy, or a lack of motivation. Occasional or mild fatigue is often a normal response to activity or life in general. However, long-lasting (chronic) or extreme fatigue may indicate an underlying medical condition.  HOME CARE INSTRUCTIONS   Watch your fatigue for any changes. The following actions may help to lessen any discomfort you are feeling:  · Talk to your health care provider about how much sleep you need each night. Try to get the required amount every night.  · Take medicines only as directed by your health care provider.  · Eat a healthy and nutritious diet. Ask your health care provider if you need help changing your diet.  · Drink enough fluid to keep your urine clear or pale yellow.  · Practice ways of relaxing, such as yoga, meditation, massage therapy, or acupuncture.  · Exercise regularly.    · Change situations that cause you stress. Try to keep your work and personal routine reasonable.  · Do not abuse illegal drugs.  · Limit alcohol intake to no more than 1 drink per day for nonpregnant women and 2 drinks per day for men. One drink equals 12 ounces of beer, 5 ounces of wine, or 1½ ounces of hard liquor.  · Take a multivitamin, if directed by your health care provider.  SEEK MEDICAL CARE IF:   · Your fatigue does not get better.  · You have a fever.    · You have unintentional weight loss or gain.  · You have headaches.    · You have difficulty:      Falling asleep.    Sleeping throughout the night.  · You feel angry, guilty, anxious, or sad.     · You are unable to have a bowel movement (constipation).    · You skin is dry.     · Your legs or another part of your body is swollen.    SEEK IMMEDIATE MEDICAL CARE IF:   · You feel confused.    · Your vision is blurry.  · You feel faint or pass out.    · You have a severe headache.    · You have severe abdominal, pelvic, or back pain.    · You have chest pain, shortness of breath, or an  irregular or fast heartbeat.    · You are unable to urinate or you urinate less than normal.    · You develop abnormal bleeding, such as bleeding from the rectum, vagina, nose, lungs, or nipples.  · You vomit blood.     · You have thoughts about harming yourself or committing suicide.    · You are worried that you might harm someone else.       This information is not intended to replace advice given to you by your health care provider. Make sure you discuss any questions you have with your health care provider.     Document Released: 02/14/2007 Document Revised: 05/10/2014 Document Reviewed: 08/21/2013  Elsevier Interactive Patient Education ©2016 [employer]

## 2015-11-06 NOTE — Progress Notes (Signed)
Pastoral Care and prayer provided.  °

## 2015-11-06 NOTE — ED Notes (Signed)
Patient presents to the ED via private vehicle from home.  Patient's wife states patient normally goes to an adult day care but he wasn't feeling this morning and didn't want to go, due to fatigue and headache.  Patient's wife called EMS which came out to the house and stated patient's blood sugar was 215.  Patient's wife states she doesn't take patient's blood sugar because, "I just can't".  Patient does have history of diabetes.  Patient reports occasional chest pain.

## 2017-10-04 ENCOUNTER — Other Ambulatory Visit: Payer: Self-pay

## 2017-10-04 ENCOUNTER — Emergency Department
Admission: EM | Admit: 2017-10-04 | Discharge: 2017-10-04 | Disposition: A | Discharge status: Home / Self Care | Payer: Medicare Other | Attending: Emergency Medicine | Admitting: Emergency Medicine

## 2017-10-04 ENCOUNTER — Emergency Department: Payer: Medicare Other

## 2017-10-04 DIAGNOSIS — E860 Dehydration: Secondary | ICD-10-CM | POA: Diagnosis not present

## 2017-10-04 DIAGNOSIS — E119 Type 2 diabetes mellitus without complications: Secondary | ICD-10-CM | POA: Diagnosis not present

## 2017-10-04 DIAGNOSIS — F0390 Unspecified dementia without behavioral disturbance: Secondary | ICD-10-CM | POA: Diagnosis not present

## 2017-10-04 DIAGNOSIS — R531 Weakness: Secondary | ICD-10-CM | POA: Diagnosis present

## 2017-10-04 DIAGNOSIS — Z79899 Other long term (current) drug therapy: Secondary | ICD-10-CM | POA: Insufficient documentation

## 2017-10-04 DIAGNOSIS — I2510 Atherosclerotic heart disease of native coronary artery without angina pectoris: Secondary | ICD-10-CM | POA: Insufficient documentation

## 2017-10-04 DIAGNOSIS — Z87891 Personal history of nicotine dependence: Secondary | ICD-10-CM | POA: Diagnosis not present

## 2017-10-04 LAB — COMPREHENSIVE METABOLIC PANEL
ALT: 15 U/L — AB (ref 17–63)
AST: 22 U/L (ref 15–41)
Albumin: 4.1 g/dL (ref 3.5–5.0)
Alkaline Phosphatase: 91 U/L (ref 38–126)
Anion gap: 11 (ref 5–15)
BUN: 30 mg/dL — AB (ref 6–20)
CHLORIDE: 103 mmol/L (ref 101–111)
CO2: 25 mmol/L (ref 22–32)
Calcium: 8.8 mg/dL — ABNORMAL LOW (ref 8.9–10.3)
Creatinine, Ser: 1.16 mg/dL (ref 0.61–1.24)
GFR calc Af Amer: >60 mL/min (ref >60)
GFR, EST NON AFRICAN AMERICAN: 57 mL/min — AB (ref >60)
Glucose, Bld: 104 mg/dL — ABNORMAL HIGH (ref 65–99)
POTASSIUM: 4.4 mmol/L (ref 3.5–5.1)
SODIUM: 139 mmol/L (ref 135–145)
Total Bilirubin: 0.6 mg/dL (ref 0.3–1.2)
Total Protein: 6.5 g/dL (ref 6.5–8.1)

## 2017-10-04 LAB — CBC WITH DIFFERENTIAL/PLATELET
Basophils Absolute: 0 10*3/uL (ref 0–0.1)
Basophils Relative: 0 %
Eosinophils Absolute: 0.4 10*3/uL (ref 0–0.7)
Eosinophils Relative: 5 %
HEMATOCRIT: 38.1 % — AB (ref 40.0–52.0)
Hemoglobin: 12.8 g/dL — ABNORMAL LOW (ref 13.0–18.0)
LYMPHS ABS: 1.7 10*3/uL (ref 1.0–3.6)
LYMPHS PCT: 26 %
MCH: 31.7 pg (ref 26.0–34.0)
MCHC: 33.5 g/dL (ref 32.0–36.0)
MCV: 94.5 fL (ref 80.0–100.0)
Monocytes Absolute: 0.8 10*3/uL (ref 0.2–1.0)
Monocytes Relative: 11 %
NEUTROS ABS: 3.8 10*3/uL (ref 1.4–6.5)
Neutrophils Relative %: 58 %
Platelets: 171 10*3/uL (ref 150–440)
RBC: 4.03 MIL/uL — AB (ref 4.40–5.90)
RDW: 13.4 % (ref 11.5–14.5)
WBC: 6.7 10*3/uL (ref 3.8–10.6)

## 2017-10-04 LAB — URINALYSIS, COMPLETE (UACMP) WITH MICROSCOPIC
BACTERIA UA: NONE SEEN
Bilirubin Urine: NEGATIVE
Glucose, UA: NEGATIVE mg/dL
Hgb urine dipstick: NEGATIVE
KETONES UR: NEGATIVE mg/dL
Leukocytes, UA: NEGATIVE
Nitrite: NEGATIVE
PROTEIN: NEGATIVE mg/dL
Specific Gravity, Urine: 1.019 (ref 1.005–1.030)
pH: 6 (ref 5.0–8.0)

## 2017-10-04 LAB — TROPONIN I

## 2017-10-04 MED ORDER — SODIUM CHLORIDE 0.9 % IV BOLUS
1000.0000 mL | Freq: Once | INTRAVENOUS | Status: AC
  Administered 2017-10-04: 1000 mL via INTRAVENOUS

## 2017-10-04 NOTE — ED Provider Notes (Addendum)
[facility] Emergency Department Provider Note   ____________________________________________   First MD Initiated Contact with Patient 10/04/17 1723     (approximate)  I have reviewed the triage vital signs and the nursing notes.   HISTORY  Chief Complaint Weakness    HPI Kairav Russomanno Anglada Sr. is a 84 y.o. male Who comes in with his wife. His wife reports an episode of slurred speech and generalized weakness. Patient says he has had generalized weakness. His speech is not slurred presently. His wife was not in the room. Patient denies any fever chills nausea vomiting coughing pain anywhere shortness of breath or any other complaints. She says generally weak all over   Past Medical History:  Diagnosis Date  . Coronary artery disease   . Depression   . Diabetes mellitus without complication (HCC)   . Seizures (HCC)   . Stroke (HCC)   . Vascular dementia     There are no active problems to display for this patient.   Past Surgical History:  Procedure Laterality Date  . CARDIAC SURGERY      Prior to Admission medications   Medication Sig Start Date End Date Taking? Authorizing Provider  atorvastatin (LIPITOR) 40 MG tablet Take 40 mg by mouth daily.    [provider]  donepezil (ARICEPT) 10 MG tablet Take 10 mg by mouth at bedtime.    [provider]  finasteride (PROSCAR) 5 MG tablet Take 5 mg by mouth daily.    [provider]  isosorbide mononitrate (IMDUR) 30 MG 24 hr tablet Take 30 mg by mouth daily.    [provider]  lamoTRIgine (LAMICTAL) 200 MG tablet Take 200 mg by mouth daily.    [provider]  sertraline (ZOLOFT) 50 MG tablet Take 50 mg by mouth daily.    [provider]    Allergies Patient has no known allergies.  No family history on file.  Social History Social History   Tobacco Use  . Smoking status: Former Smoker  Substance Use Topics  . Alcohol use: No  .  Drug use: Not on file    Review of Systems  Constitutional: No fever/chills Eyes: No visual changes. ENT: No sore throat. Cardiovascular: Denies chest pain. Respiratory: Denies shortness of breath. Gastrointestinal: No abdominal pain.  No nausea, no vomiting.  No diarrhea.  No constipation. Genitourinary: Negative for dysuria. Musculoskeletal: Negative for back pain. Skin: Negative for rash. Neurological: Negative for headaches, focal weakness   ____________________________________________   PHYSICAL EXAM:  VITAL SIGNS: ED Triage Vitals  Enc Vitals Group     BP 10/04/17 1728 133/74     Pulse Rate 10/04/17 1728 (!) 55     Resp 10/04/17 1728 14     Temp 10/04/17 1728 97.7 F (36.5 C)     Temp Source 10/04/17 1728 Oral     SpO2 10/04/17 1728 97 %     Weight 10/04/17 1730 200 lb (90.7 kg)     Height 10/04/17 1730 5\' 8"  (1.727 m)     Head Circumference --      Peak Flow --      Pain Score --      Pain Loc --      Pain Edu? --      Excl. in GC? --     Constitutional: Alert and oriented. Well appearing and in no acute distress. Eyes: Conjunctivae are normal. PER. EOMI. Head: Atraumatic. Nose: No congestion/rhinnorhea. Mouth/Throat: Mucous membranes are moist.  Oropharynx non-erythematous. Neck: No stridor.  Cardiovascular: Normal rate, regular rhythm. Grossly normal heart sounds.  Good peripheral circulation. Respiratory: Normal respiratory effort.  No retractions. Lungs CTAB. Gastrointestinal: Soft and nontender. No distention. No abdominal bruits. No CVA tenderness. Musculoskeletal: No lower extremity tenderness nor edema.  No joint effusions. Neurologic:  Normal speech and language. No gross focal neurologic deficits are appreciated. .cranial nerves 2 through 12 are intact of the visual fields were not checked cerebellar finger to noseis normal motor strength is 5 over 5 throughout all the patient has trouble controlling his left hand well. Skin:  Skin is warm, dry and  intact. No rash noted. Psychiatric: Mood and affect are normal. Speech and behavior are normal.  ____________________________________________   LABS (all labs ordered are listed, but only abnormal results are displayed)  Labs Reviewed  COMPREHENSIVE METABOLIC PANEL - Abnormal; Notable for the following components:      Result Value   Glucose, Bld 104 (*)    BUN 30 (*)    Calcium 8.8 (*)    ALT 15 (*)    GFR calc non Af Amer 57 (*)    All other components within normal limits  CBC WITH DIFFERENTIAL/PLATELET - Abnormal; Notable for the following components:   RBC 4.03 (*)    Hemoglobin 12.8 (*)    HCT 38.1 (*)    All other components within normal limits  URINALYSIS, COMPLETE (UACMP) WITH MICROSCOPIC - Abnormal; Notable for the following components:   Color, Urine YELLOW (*)    APPearance CLEAR (*)    All other components within normal limits  TROPONIN I   ____________________________________________  EKG  EKG read and interpreted by me shows sinus bradycardia rate of 55 normal axis no acute ST-T changes ____________________________________________  RADIOLOGY  ED MD interpretation: CT shows no acute problems.  Official radiology report(s): Dg Chest 2 View  Result Date: 10/04/2017 CLINICAL DATA:  Slurred speech, generalized weakness, history diabetes mellitus, stroke, coronary artery disease, former smoker EXAM: CHEST - 2 VIEW COMPARISON:  11/06/2015 FINDINGS: Enlargement of cardiac silhouette post CABG. Mediastinal contours and pulmonary vascularity normal. Lungs clear. No pleural effusion or pneumothorax. Bones demineralized. IMPRESSION: Minimal enlargement of cardiac silhouette post CABG. No acute infiltrate. Electronically Signed   By: Ulyses SouthwardMark  Boles M.D.   On: 10/04/2017 17:59   Ct Head Wo Contrast  Result Date: 10/04/2017 CLINICAL DATA:  Slurred speech and increased generalized weakness. History of CVA. EXAM: CT HEAD WITHOUT CONTRAST TECHNIQUE: Contiguous axial images were  obtained from the base of the skull through the vertex without intravenous contrast. COMPARISON:  11/06/2015 FINDINGS: Brain: No evidence of acute infarction, hemorrhage, hydrocephalus, extra-axial collection or mass lesion/mass effect. Stable large area of encephalomalacia from prior right posterior frontal lobe cortical and subcortical infarct. Generalized volume loss with ex vacuo dilatation of the ventricles, stable. Periventricular microvascular ischemic changes. Vascular: Calcific atherosclerotic disease of the intra cavernous carotid arteries, and vertebral arteries. Skull: Normal. Negative for fracture or focal lesion. Sinuses/Orbits: Polypoid mucosal thickening of the bilateral ethmoid and frontal sinuses, and the visualized portion of the maxillary sinuses. Other: Bilateral cataract resection. IMPRESSION: No acute intracranial abnormality. Stable encephalomalacia from remote right MCA territory infarcts. Atrophy, chronic microvascular disease. Electronically Signed   By: Ted Mcalpineobrinka  Dimitrova M.D.   On: 10/04/2017 18:10    ____________________________________________   PROCEDURES  Procedure(s) performed:   Procedures  Critical Care performed:   ____________________________________________   INITIAL IMPRESSION / ASSESSMENT AND PLAN / ED COURSE  ----------------------------------------- 8:06 PM  on 10/04/2017 -----------------------------------------  Patient now feels better we'll finish his IV fluid and let him go return anytime he is worse. Patient was never having any slurred speech here. He does want to go home.I spoke with his wife she said she was told she picked him up his caregiver that he had been mumbling an not doing much. When she got him his speech was not slurred. Was clear that he was kind of tired and went and laid down on the bed. Here again his speech was clear felt tired but now he is feeling better he still clear his no focal neurological problems that are new. He  thinks he is dehydrated. He is wanting to go home. His wife is okay with this. He will bring him back if he gets worse at all. Both he and his wife were offered the option of admission.  I should add that when the patient's resting his heart rate goes down into the low 50s even once while he was asleep down to 49. When he wakes up and begins talking though it goes up to 55-60.      ____________________________________________   FINAL CLINICAL IMPRESSION(S) / ED DIAGNOSES  Final diagnoses:  Dehydration     ED Discharge Orders    None       Note:  This document was prepared using Dragon voice recognition software and may include unintentional dictation errors.    Arnaldo Natal, MD 10/04/17 2012    Arnaldo Natal, MD 10/04/17 2014    Arnaldo Natal, MD 10/04/17 2015

## 2017-10-04 NOTE — ED Notes (Signed)
Report to Laura, RN

## 2017-10-04 NOTE — Discharge Instructions (Addendum)
Please return for any further problems. Please follow-up with his regular doctor. Please be sure that, when it is hot outside, he drinks plenty of water.

## 2017-10-04 NOTE — ED Triage Notes (Signed)
Pt arrived via EMS from home d/t episode of slurred speech and increase in generalized weakness that was described by pt wife. Pt has hx of CVA and dementia. Pt denies any symptoms at this time.

## 2017-12-07 ENCOUNTER — Encounter: Payer: Self-pay | Admitting: Physical Therapy

## 2017-12-07 ENCOUNTER — Ambulatory Visit: Payer: Medicare Other | Attending: Geriatric Medicine | Admitting: Physical Therapy

## 2017-12-07 ENCOUNTER — Other Ambulatory Visit: Payer: Self-pay

## 2017-12-07 DIAGNOSIS — Z9181 History of falling: Secondary | ICD-10-CM | POA: Diagnosis present

## 2017-12-07 DIAGNOSIS — R2689 Other abnormalities of gait and mobility: Secondary | ICD-10-CM | POA: Insufficient documentation

## 2017-12-07 DIAGNOSIS — M6281 Muscle weakness (generalized): Secondary | ICD-10-CM | POA: Diagnosis present

## 2017-12-07 DIAGNOSIS — R2681 Unsteadiness on feet: Secondary | ICD-10-CM

## 2017-12-07 NOTE — Therapy (Addendum)
[facility] [address] [place], [place], [address] Phone: [phone]   Fax:  [phone]  Physical Therapy Evaluation  Patient Details  Name: Chad Patteelva J Kokesh Sr. MRN: [id number] Date of Birth: 06/15/1933 Referring Provider: Dr. Gilmore Larocheolon-Emeric   Encounter Date: 12/07/2017  PT End of Session - 12/07/17 1254    Visit Number  1    Number of Visits  7    Date for PT Re-Evaluation  01/18/18    Authorization Type  progress note 1/10    PT Start Time  1000    PT Stop Time  1100    PT Time Calculation (min)  60 min    Equipment Utilized During Treatment  Gait belt    Activity Tolerance  Patient tolerated treatment well    Behavior During Therapy  WFL for tasks assessed/performed       Past Medical History:  Diagnosis Date  . Coronary artery disease   . Depression   . Diabetes mellitus without complication (HCC)   . Seizures (HCC)   . Stroke (HCC)   . Vascular dementia     Past Surgical History:  Procedure Laterality Date  . CARDIAC SURGERY      There were no vitals filed for this visit.  Subjective Assessment - 12/07/17 1006    Subjective  "Doctor wanted me to come to PT; for walking or something." Pt's wife reports his last fall at home was about 3 weeks; denies any injury with that fall. Pt also fell at daycare a couple weeks ago.     Patient is accompained by:  Family member Wife    Pertinent History  Chad [name] is a 84 yo male with PMH significant for CVA, seizure disorder, depression, CAD, GERD, vascular dementia with behavioral disturbance, and T2DM. Pt presents to therapy today due to abnormality of gait and frequent falls. Pt acknolwedges he has some issues with walking and falling as well. [facility] but does not use it in the home. 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     Limitations  Walking;Standing    How long can you  sit comfortably?  NA    How long can you stand comfortably?  About 10 minutes or so    How long can you walk comfortably?  Walk about 10-12 minutes and then very fatigued following walk    Patient Stated Goals  "I'd like to be able to stand longer and walk longer."    Currently in Pain?  No/denies         [facility] Assessment - 12/07/17 0001      Assessment   Medical Diagnosis  Gait disorder/frequent falls    Referring Provider  Dr. Gilmore Larocheolon-Emeric    Onset Date/Surgical Date  05/04/07 Stroke in 2009/believes that's when gait abnormality started    Hand Dominance  Right    Next MD Visit  12/14/2017    Prior Therapy  None      Precautions   Precautions  Fall      Restrictions   Weight Bearing Restrictions  No      Balance Screen   Has the patient fallen in the past 6 months  Yes    How many times?  5    Has the patient had a decrease in activity level because of a fear of falling?   Yes    Is  the patient reluctant to leave their home because of a fear of falling?   Yes      Home Environment   Living Environment  Private residence    Living Arrangements  Spouse/significant other    Available Help at Discharge  Family;Friend(s)    Type of Home  House    Home Access  Stairs to enter    Entrance Stairs-Number of Steps  2    Entrance Stairs-Rails  Right    Home Layout  One level    Home Equipment  Cane - quad      Prior Function   Level of Independence  Needs assistance with ADLs;Needs assistance with homemaking;Independent with household mobility without device    Vocation  Retired    Geographical information systems officer, go to adult daycare 4x/week, go for walks      Cognition   Overall Cognitive Status  History of cognitive impairments - at baseline      Sensation   Light Touch  Appears Intact      Coordination   Gross Motor Movements are Fluid and Coordinated  Yes    Fine Motor Movements are Fluid and Coordinated  No    Finger Nose Finger Test  Slow but accurate    Heel Shin Test  Slow  but accurate      Posture/Postural Control   Posture Comments  Forward head posture      ROM / Strength   AROM / PROM / Strength  Strength      Strength   Right Hip Flexion  4-/5    Right Hip ABduction  4+/5    Right Hip ADduction  4+/5    Left Hip Flexion  3+/5    Left Hip ABduction  4/5    Left Hip ADduction  4/5    Right Knee Flexion  5/5    Right Knee Extension  5/5    Left Knee Flexion  4+/5    Left Knee Extension  4+/5    Right Ankle Dorsiflexion  5/5    Left Ankle Dorsiflexion  4/5      Transfers   Transfers  Sit to Stand;Stand Pivot Transfers    Sit to Stand  6: Modified independent (Device/Increase time)    Stand Pivot Transfers  6: Modified independent (Device/Increase time)    Stand Pivot Transfer Details (indicate cue type and reason)  Increased time and for safety      Ambulation/Gait   Gait Comments  Decreased stance time on L with circumduction at L hip/decreased L ankle DF and hip flexion, decreased pelvic motion, increased forward head posture, decreased arm swing      Standardized Balance Assessment   Five times sit to stand comments   13.2 sec with RUE support    10 Meter Walk  0.65 m/s carrying SBQC (15.3 sec carrying quad cane)      Berg Balance Test   Sit to Stand  Able to stand using hands after several tries    Standing Unsupported  Able to stand 2 minutes with supervision    Sitting with Back Unsupported but Feet Supported on Floor or Stool  Able to sit safely and securely 2 minutes    Stand to Sit  Controls descent by using hands    Transfers  Able to transfer with verbal cueing and /or supervision    Standing Unsupported with Eyes Closed  Able to stand 10 seconds with supervision    Standing Ubsupported with Feet Together  Able to place feet together independently and stand for 1 minute with supervision    From Standing, Reach Forward with Outstretched Arm  Can reach forward >12 cm safely (5")    From Standing Position, Pick up Object from Floor   Able to pick up shoe, needs supervision    From Standing Position, Turn to Look Behind Over each Shoulder  Turn sideways only but maintains balance    Turn 360 Degrees  Needs close supervision or verbal cueing    Standing Unsupported, Alternately Place Feet on Step/Stool  Needs assistance to keep from falling or unable to try    Standing Unsupported, One Foot in Front  Able to take small step independently and hold 30 seconds    Standing on One Leg  Unable to try or needs assist to prevent fall    Total Score  31    Berg comment:  High risk for falls (close to 100%)                           PT Education - 12/07/17 1254    Education Details  plan of care, recommendations    Person(s) Educated  Patient;Spouse    Methods  Explanation    Comprehension  Verbalized understanding       PT Short Term Goals - 12/07/17 1304      PT SHORT TERM GOAL #1   Title  Patient will be independent in home exercise program to improve strength/mobility for better functional independence with ADLs.    Time  3    Period  Weeks    Status  New    Target Date  12/28/17        PT Long Term Goals - 12/07/17 1307      PT LONG TERM GOAL #1   Title  Pt will improve Berg Balance Assessment score by 7 points to decrease fall risk in home and community environment.     Baseline  12/07/17: 31/56    Time  6    Period  Weeks    Status  New    Target Date  01/18/18      PT LONG TERM GOAL #2   Title  Pt will improve B hip flexion strength to 4+/5 for improvements in functional strength for independent gait, increased standing tolerance, and increased ADL ability.    Baseline  12/07/17: L hip 3+/5, R hip 4-/5    Time  6    Period  Weeks    Status  New    Target Date  01/18/18      PT LONG TERM GOAL #3   Title  Pt will increase gait speed by 0.35 m/s to improve ambulation in the home and community environment and to reduce fall risk.     Baseline  12/07/17: 0.65 m/s    Time  6    Period   Weeks    Status  New    Target Date  01/18/18      PT LONG TERM GOAL #4   Title  Pt will decrease 5 times sit-to-stand time to less than 15 sec without UE support to demonstrate decreased fall risk and increased LE strength and endurance.    Baseline  12/07/17: 13.2 seconds with RUE support    Time  6    Period  Weeks    Status  New    Target Date  01/18/18  Plan - 12/07/17 1255    Clinical Impression Statement  Chad Rung is a 84 yo male with PMH significant for dementia with behavioral changes, T2DM, CAD, stroke, and seizures. Pt presents with complaints for difficulty with balance and gait; history of falls (about 5 within the last 6 months). Pt is fearful of going out of the house due to fear of falling. Pt has SBQC to use when ambulating but does not use it in the home. Pt required to use SBQC at adult daycare but prefers to not use it. Pt displays some agitation when asked questions regarding history and current activity level but returns to pleasant mood quickly and is agreeable to perform a variety of tasks. Pt would benefit from skilled PT intervention for improvements in balance and gait safety.     History and Personal Factors relevant to plan of care:  (+) family support, aware of imbalance (-) dementia, comorbidities, lack of safety awareness, history of falls    Clinical Presentation  Unstable    Clinical Presentation due to:  dementia, comorbidities, fall history    Clinical Decision Making  High    Rehab Potential  Fair    Clinical Impairments Affecting Rehab Potential  dementia, fall history, comorbidities    PT Frequency  1x / week    PT Duration  6 weeks    PT Treatment/Interventions  Cryotherapy;Electrical Stimulation;Moist Heat;Ultrasound;Gait training;Stair training;DME Instruction;Functional mobility training;Therapeutic activities;Therapeutic exercise;Balance training;Neuromuscular re-education;Patient/family education;Manual techniques;Energy conservation    PT  Next Visit Plan  HEP, balance and gait training    PT Home Exercise Plan  give at next visit    Consulted and Agree with Plan of Care  Patient;Family member/caregiver    Family Member Consulted  Wife       Patient will benefit from skilled therapeutic intervention in order to improve the following deficits and impairments:  Abnormal gait, Decreased activity tolerance, Decreased balance, Decreased cognition, Decreased coordination, Decreased endurance, Decreased knowledge of precautions, Decreased knowledge of use of DME, Decreased mobility, Decreased safety awareness, Decreased strength, Difficulty walking, Impaired perceived functional ability, Improper body mechanics, Postural dysfunction  Visit Diagnosis: History of falling  Muscle weakness (generalized)  Other abnormalities of gait and mobility  Unsteadiness on feet     Problem List There are no active problems to display for this patient.  Dossie Arbour, SPT This entire session was performed under direct supervision and direction of a licensed therapist/therapist assistant . I have personally read, edited and approve of the note as written.  Trotter,Margaret PT, DPT 12/07/2017, 5:04 PM  [facility] MAIN [facility] SERVICES [address] [place], [place], [address] Phone: [phone]   Fax:  [phone]  Name: Fabien Travelstead [facility] Sr. MRN: [id number] Date of Birth: Nov 13, 1933

## 2017-12-14 ENCOUNTER — Ambulatory Visit: Payer: Medicare Other | Admitting: Physical Therapy

## 2017-12-21 ENCOUNTER — Ambulatory Visit: Payer: Medicare Other | Admitting: Physical Therapy

## 2017-12-21 ENCOUNTER — Encounter: Payer: Self-pay | Admitting: Physical Therapy

## 2017-12-21 DIAGNOSIS — M6281 Muscle weakness (generalized): Secondary | ICD-10-CM

## 2017-12-21 DIAGNOSIS — Z9181 History of falling: Secondary | ICD-10-CM | POA: Diagnosis not present

## 2017-12-21 DIAGNOSIS — R2689 Other abnormalities of gait and mobility: Secondary | ICD-10-CM

## 2017-12-21 DIAGNOSIS — R2681 Unsteadiness on feet: Secondary | ICD-10-CM

## 2017-12-21 NOTE — Patient Instructions (Signed)
Access Code: [email or web address]  URL: [email or web address]  Date: 12/21/2017  Prepared by: Zettie PhoMargaret Trotter   Exercises  Standing March with Counter Support - 15 reps - 2 sets - 1x daily - 7x weekly  Heel rises with counter support - 15 reps - 2 sets - 1x daily - 7x weekly  Standing Tandem Balance with Counter Support - 5 reps - 1 sets - 15 hold - 1x daily - 7x weekly

## 2017-12-21 NOTE — Therapy (Addendum)
[facility] MAIN [facility] [address] [place], [place], [address] Phone: [phone]   Fax:  [phone]  Physical Therapy Treatment  Patient Details  Name: Chad Patteelva J Nagorski Sr. MRN: [id number] Date of Birth: [birth year] Referring Provider: Dr. Gilmore Larocheolon-Emeric   Encounter Date: 12/21/2017  PT End of Session - 12/21/17 1319    Visit Number  2    Number of Visits  7    Date for PT Re-Evaluation  01/18/18    Authorization Type  progress note 2/10    PT Start Time  1302    PT Stop Time  1345    PT Time Calculation (min)  43 min    Equipment Utilized During Treatment  Gait belt    Activity Tolerance  Patient tolerated treatment well    Behavior During Therapy  WFL for tasks assessed/performed       Past Medical History:  Diagnosis Date  . Coronary artery disease   . Depression   . Diabetes mellitus without complication (HCC)   . Seizures (HCC)   . Stroke (HCC)   . Vascular dementia     Past Surgical History:  Procedure Laterality Date  . CARDIAC SURGERY      There were no vitals filed for this visit.  Subjective Assessment - 12/21/17 1317    Subjective  Patient states he is doing alright today; denies any falls today. Pt's wife reports no new falls since last visit.    Patient is accompained by:  Family member   Wife   Pertinent History  Chad [name] is a 84 yo male with PMH significant for CVA, seizure disorder, depression, CAD, GERD, vascular dementia with behavioral disturbance, and T2DM. Pt presents to therapy today due to abnormality of gait and frequent falls. Pt acknolwedges he has some issues with walking and falling as well. [facility] but does not use it in the home. 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     Limitations  Walking;Standing    How long can you sit comfortably?  NA    How long can you stand comfortably?  About 10 minutes or  so    How long can you walk comfortably?  Walk about 10-12 minutes and then very fatigued following walk    Patient Stated Goals  "I'd like to be able to stand longer and walk longer."    Currently in Pain?  No/denies       Treatment Tandem stance in // bars x15 sec holds with each foot in rear, CGA-min A for stability without UE support, VCs for positioning  Forwards/backwards walking in // bars x2 laps without UE support, CGA for safety, VCs for taking big steps   Airex pad in // bars:  Marching x10 reps each leg Mini squats x10 reps Toe taps onto stepping stones x10 reps each leg Heel raises x10 reps bilaterally Semi tandem stance x15 sec holds with each foot in front Overhead ball lifts x5 reps Side/side ball passes with therapist x5 passes -Required VCs for proper technique and positioning as well as engagement in activities; demonstration utilized prior to beginning each new activity  Forwards/backwards stepping over bolster x10 reps, CGA for safety with VCs to pick up each foot high enough to clear the bolster  Marching with each foot on side of bolster x10 reps each leg, CGA for safety with VCs to maintain the increased  BOS for better stability  1/2 foam roll, flat side up: AP tilts x1 min with intermittent UE support, CGA for safety, VCs for sequencing and transitioning between directions for rocking  Airex balance beam: Tandem walking x2 laps, CGA for safety, VCs for proper technique and sequencing of turns, intermittent UE support used for minor LOB experienced Side stepping x2 laps, CGA for safety, VCs for proper technique with demonstration for task  Nustep level 2 x4 mins to work on reciprocal movements and engaging L arm and hand in movement    PT Education - 12/21/17 1318    Education Details  balance, LE strengthening, exercise technique    Person(s) Educated  Patient    Methods  Explanation;Demonstration;Verbal cues    Comprehension  Verbalized  understanding;Returned demonstration;Verbal cues required;Need further instruction       PT Short Term Goals - 12/07/17 1304      PT SHORT TERM GOAL #1   Title  Patient will be independent in home exercise program to improve strength/mobility for better functional independence with ADLs.    Time  3    Period  Weeks    Status  New    Target Date  12/28/17        PT Long Term Goals - 12/07/17 1307      PT LONG TERM GOAL #1   Title  Pt will improve Berg Balance Assessment score by 7 points to decrease fall risk in home and community environment.     Baseline  12/07/17: 31/56    Time  6    Period  Weeks    Status  New    Target Date  01/18/18      PT LONG TERM GOAL #2   Title  Pt will improve B hip flexion strength to 4+/5 for improvements in functional strength for independent gait, increased standing tolerance, and increased ADL ability.    Baseline  12/07/17: L hip 3+/5, R hip 4-/5    Time  6    Period  Weeks    Status  New    Target Date  01/18/18      PT LONG TERM GOAL #3   Title  Pt will increase gait speed by 0.35 m/s to improve ambulation in the home and community environment and to reduce fall risk.     Baseline  12/07/17: 0.65 m/s    Time  6    Period  Weeks    Status  New    Target Date  01/18/18      PT LONG TERM GOAL #4   Title  Pt will decrease 5 times sit-to-stand time to less than 15 sec without UE support to demonstrate decreased fall risk and increased LE strength and endurance.    Baseline  12/07/17: 13.2 seconds with RUE support    Time  6    Period  Weeks    Status  New    Target Date  01/18/18            Plan - 12/21/17 1435    Clinical Impression Statement  Patient tolerated therapy session well. Pt performed standing balance and strengthening activities with CGA-min A for minor LOB; required VCs for engagement in activity as well as proper technique and positioning. Pt agreeable to therapy but did require seated rest breaks secondary to fatigue  and demonstrated mild SOB following standing activities. Pt required demonstration for each exercise with mod VC for safety as well. Pt will continue to benefit  from skilled PT intervention for improvements in balance, strength, and gait safety.     Rehab Potential  Fair    Clinical Impairments Affecting Rehab Potential  dementia, fall history, comorbidities    PT Frequency  1x / week    PT Duration  6 weeks    PT Treatment/Interventions  Cryotherapy;Electrical Stimulation;Moist Heat;Ultrasound;Gait training;Stair training;DME Instruction;Functional mobility training;Therapeutic activities;Therapeutic exercise;Balance training;Neuromuscular re-education;Patient/family education;Manual techniques;Energy conservation    PT Next Visit Plan  HEP, balance and gait training    PT Home Exercise Plan  standing marching, heel raises, tandem stance all at kitchen counter    Consulted and Agree with Plan of Care  Patient;Family member/caregiver    Family Member Consulted  Wife       Patient will benefit from skilled therapeutic intervention in order to improve the following deficits and impairments:  Abnormal gait, Decreased activity tolerance, Decreased balance, Decreased cognition, Decreased coordination, Decreased endurance, Decreased knowledge of precautions, Decreased knowledge of use of DME, Decreased mobility, Decreased safety awareness, Decreased strength, Difficulty walking, Impaired perceived functional ability, Improper body mechanics, Postural dysfunction  Visit Diagnosis: History of falling  Muscle weakness (generalized)  Other abnormalities of gait and mobility  Unsteadiness on feet     Problem List There are no active problems to display for this patient.  Dossie Arbour, SPT This entire session was performed under direct supervision and direction of a licensed therapist/therapist assistant . I have personally read, edited and approve of the note as written.  Trotter,Margaret PT,  DPT 12/21/2017, 3:46 PM  [place] [facility] MAIN [facility] SERVICES [address] [place], [place], [address] Phone: [phone]   Fax:  [phone]  Name: Chad Bither [facility] Sr. MRN: [id number] Date of Birth: 03-Sep-1933

## 2017-12-28 ENCOUNTER — Ambulatory Visit: Payer: Medicare Other

## 2017-12-28 DIAGNOSIS — R2681 Unsteadiness on feet: Secondary | ICD-10-CM

## 2017-12-28 DIAGNOSIS — R2689 Other abnormalities of gait and mobility: Secondary | ICD-10-CM

## 2017-12-28 DIAGNOSIS — Z9181 History of falling: Secondary | ICD-10-CM

## 2017-12-28 DIAGNOSIS — M6281 Muscle weakness (generalized): Secondary | ICD-10-CM

## 2017-12-28 NOTE — Therapy (Signed)
[place] [facility] MAIN [facility] SERVICES [address] [place], [place], [address] Phone: [phone]   Fax:  [phone]  Physical Therapy Treatment  Patient Details  Name: Chad Pine Koslow Sr. MRN: [id number] Date of Birth: 1933-12-27 Referring Provider: Dr. Gilmore Laroche   Encounter Date: 12/28/2017  PT End of Session - 12/28/17 1021    Visit Number  3    Number of Visits  7    Date for PT Re-Evaluation  01/18/18    Authorization Type  progress note 3/10    PT Start Time  1015    PT Stop Time  1059    PT Time Calculation (min)  44 min    Equipment Utilized During Treatment  Gait belt    Activity Tolerance  Patient tolerated treatment well    Behavior During Therapy  WFL for tasks assessed/performed       Past Medical History:  Diagnosis Date  . Coronary artery disease   . Depression   . Diabetes mellitus without complication (HCC)   . Seizures (HCC)   . Stroke (HCC)   . Vascular dementia     Past Surgical History:  Procedure Laterality Date  . CARDIAC SURGERY      There were no vitals filed for this visit.  Subjective Assessment - 12/28/17 1020    Subjective  Patient and wife report no falls between sessions. Reports not doing exercises because hes "lazy".     Patient is accompained by:  Family member   Wife   Pertinent History  Chad [name] is a 84 yo male with PMH significant for CVA, seizure disorder, depression, CAD, GERD, vascular dementia with behavioral disturbance, and T2DM. Pt presents to therapy today due to abnormality of gait and frequent falls. Pt acknolwedges he has some issues with walking and falling as well. Pt currently walks with [facility] occassionally but does not use it in the home. 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     Limitations  Walking;Standing    How long can you sit comfortably?  NA    How long can you stand comfortably?  About 10 minutes or so    How long  can you walk comfortably?  Walk about 10-12 minutes and then very fatigued following walk    Patient Stated Goals  "I'd like to be able to stand longer and walk longer."    Currently in Pain?  No/denies       Treatment   Modified weight shift with green pad under R foot for weight shift onto L side: 2 minutes  Airex pad in // bars:  Marching x10 reps each leg  One foot on each airex pad: Semi tandem stance x40 sec holds with each foot in front : frequent trunk flexion.   Airex balance beam: unable to perofrm.   Toe taps onto stepping stones x10 reps each leg; PT guiding color and leg choice   Heel raises x10 reps bilaterally; BUE support  Big step forward with SUE support 10x each leg; cues for slowing down and bringing feet back together.   Big step backwards SUE support 10x each leg; cues for bringing leg back.   Forwards/backwards stepping over bolster x10 reps, CGA for safety with VCs to pick up each foot high enough to clear the bolster   Marching with each foot on side of bolster x10 reps each leg, CGA for safety with VCs to maintain  the increased BOS for better stability   Ambulate 2 lengths of // bars with wooden dowel between feet cues for SUE support    Side stepping x2 laps, CGA for safety, VCs for proper technique with demonstration for task    -Required VCs for proper technique and positioning as well as engagement in activities; demonstration utilized prior to beginning each new activity                         PT Education - 12/28/17 1021    Education Details  HEP compliance, balance, exercise technique     Person(s) Educated  Patient    Methods  Explanation;Demonstration;Verbal cues    Comprehension  Verbalized understanding;Returned demonstration       PT Short Term Goals - 12/07/17 1304      PT SHORT TERM GOAL #1   Title  Patient will be independent in home exercise program to improve strength/mobility for better functional  independence with ADLs.    Time  3    Period  Weeks    Status  New    Target Date  12/28/17        PT Long Term Goals - 12/07/17 1307      PT LONG TERM GOAL #1   Title  Pt will improve Berg Balance Assessment score by 7 points to decrease fall risk in home and community environment.     Baseline  12/07/17: 31/56    Time  6    Period  Weeks    Status  New    Target Date  01/18/18      PT LONG TERM GOAL #2   Title  Pt will improve B hip flexion strength to 4+/5 for improvements in functional strength for independent gait, increased standing tolerance, and increased ADL ability.    Baseline  12/07/17: L hip 3+/5, R hip 4-/5    Time  6    Period  Weeks    Status  New    Target Date  01/18/18      PT LONG TERM GOAL #3   Title  Pt will increase gait speed by 0.35 m/s to improve ambulation in the home and community environment and to reduce fall risk.     Baseline  12/07/17: 0.65 m/s    Time  6    Period  Weeks    Status  New    Target Date  01/18/18      PT LONG TERM GOAL #4   Title  Pt will decrease 5 times sit-to-stand time to less than 15 sec without UE support to demonstrate decreased fall risk and increased LE strength and endurance.    Baseline  12/07/17: 13.2 seconds with RUE support    Time  6    Period  Weeks    Status  New    Target Date  01/18/18            Plan - 12/28/17 1225    Clinical Impression Statement  Patient demonstrates large forward trunk lean that is exacerbated with fatigue. Patient challenged with prolonged standing with fatigue quickly limiting functional capacity. Patient requires constant CGA.  Pt would benefit from skilled PT intervention for improvements in balance and gait safety.     Rehab Potential  Fair    Clinical Impairments Affecting Rehab Potential  dementia, fall history, comorbidities    PT Frequency  1x / week    PT Duration  6 weeks  PT Treatment/Interventions  Cryotherapy;Electrical Stimulation;Moist Heat;Ultrasound;Gait  training;Stair training;DME Instruction;Functional mobility training;Therapeutic activities;Therapeutic exercise;Balance training;Neuromuscular re-education;Patient/family education;Manual techniques;Energy conservation    PT Next Visit Plan  HEP, balance and gait training    PT Home Exercise Plan  standing marching, heel raises, tandem stance all at kitchen counter    Consulted and Agree with Plan of Care  Patient;Family member/caregiver    Family Member Consulted  Wife       Patient will benefit from skilled therapeutic intervention in order to improve the following deficits and impairments:  Abnormal gait, Decreased activity tolerance, Decreased balance, Decreased cognition, Decreased coordination, Decreased endurance, Decreased knowledge of precautions, Decreased knowledge of use of DME, Decreased mobility, Decreased safety awareness, Decreased strength, Difficulty walking, Impaired perceived functional ability, Improper body mechanics, Postural dysfunction  Visit Diagnosis: History of falling  Muscle weakness (generalized)  Other abnormalities of gait and mobility  Unsteadiness on feet     Problem List There are no active problems to display for this patient.  Precious Bard, PT, DPT   12/28/2017, 12:25 PM  [place] [facility] MAIN [facility] SERVICES [address] [place], [place], [address] Phone: [phone]   Fax:  [phone]  Name: Vasili Fok [facility] Sr. MRN: [id number] Date of Birth: 12/12/1933

## 2018-01-04 ENCOUNTER — Encounter: Payer: Self-pay | Admitting: Physical Therapy

## 2018-01-04 ENCOUNTER — Ambulatory Visit: Payer: Medicare Other | Attending: Geriatric Medicine | Admitting: Physical Therapy

## 2018-01-04 DIAGNOSIS — R2689 Other abnormalities of gait and mobility: Secondary | ICD-10-CM | POA: Insufficient documentation

## 2018-01-04 DIAGNOSIS — Z9181 History of falling: Secondary | ICD-10-CM | POA: Diagnosis not present

## 2018-01-04 DIAGNOSIS — R2681 Unsteadiness on feet: Secondary | ICD-10-CM | POA: Insufficient documentation

## 2018-01-04 DIAGNOSIS — M6281 Muscle weakness (generalized): Secondary | ICD-10-CM | POA: Diagnosis present

## 2018-01-04 NOTE — Therapy (Addendum)
[place] [facility] MAIN [facility] SERVICES [address] [place], [place], [address] Phone: [phone]   Fax:  [phone]  Physical Therapy Treatment  Patient Details  Name: Chad Scurto Tien Sr. MRN: [id number] Date of Birth: Dec 29, 1933 Referring Provider: Dr. Gilmore Laroche   Encounter Date: 01/04/2018  PT End of Session - 01/04/18 1001    Visit Number  4    Number of Visits  7    Date for PT Re-Evaluation  01/18/18    Authorization Type  progress note 4/10    PT Start Time  1015    PT Stop Time  1100    PT Time Calculation (min)  45 min    Equipment Utilized During Treatment  Gait belt    Activity Tolerance  Patient tolerated treatment well    Behavior During Therapy  WFL for tasks assessed/performed       Past Medical History:  Diagnosis Date  . Coronary artery disease   . Depression   . Diabetes mellitus without complication (HCC)   . Seizures (HCC)   . Stroke (HCC)   . Vascular dementia     Past Surgical History:  Procedure Laterality Date  . CARDIAC SURGERY      There were no vitals filed for this visit.  Subjective Assessment - 01/04/18 1022    Subjective  Patient denies any pain today and does not think he has had any recent falls. Pt reports he is "not exercising as much as I should but a little bit."    Patient is accompained by:  Family member   Wife   Pertinent History  Chad [name] is a 84 yo male with PMH significant for CVA, seizure disorder, depression, CAD, GERD, vascular dementia with behavioral disturbance, and T2DM. Pt presents to therapy today due to abnormality of gait and frequent falls. Pt acknolwedges he has some issues with walking and falling as well. Pt currently walks with [facility] occassionally but does not use it in the home. 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     Limitations  Walking;Standing    How long can you sit comfortably?  NA    How long can you stand  comfortably?  About 10 minutes or so    How long can you walk comfortably?  Walk about 10-12 minutes and then very fatigued following walk    Patient Stated Goals  "I'd like to be able to stand longer and walk longer."    Currently in Pain?  No/denies        Treatment Nustep level 2 x4 mins to work on reciprocal movements and engaging L arm and hand in movement; encouraged to keep speed around 60 SPM  Semi tandem stance in // bars x15 sec hold with each foot in rear, CGA-min A for stability without UE support, VCs for positioning   Forwards/backwards walking in // bars x2 laps without UE support, CGA for safety, VCs for taking big steps and avoiding forward trunk lean  Airex balance beam: Tandem walking x2 laps, CGA for safety, VCs for proper technique and sequencing of turns, intermittent UE support used for minor LOB experienced Side stepping x2 laps, CGA-min A for safety, VCs for proper technique with demonstration for task, RUE support   Airex pad in // bars:  Marching x10 reps each leg Mini squats x10 reps Toe taps onto stepping stones x10 reps each leg Heel raises x10 reps bilaterally  Overhead ball lifts x10 reps Side/side ball passes with therapist x10 passes -Required VCs for proper technique and positioning as well as engagement in activities; demonstration utilized prior to beginning each new activity  Balloon passes standing on airex pad x2 min bouts, required CGA-min A for recovery from minor LOB in left and posterior direction, VCs for reaching for the balloon to work on coordination and reflex  Forwards/backwards stepping over bolster x5 reps, min A for maintaining balance with VCs to pick up each foot high enough to clear the bolster   Side stepping over bolster x5 reps each direction, min A for maintaining balance with VCs to pick up each foot high enough to clear the bolster and taking a big enough step to get both feet across  1/2 foam roll, flat side up: AP tilts  x1 min with intermittent UE support, min A for maintaining balance and stability, VCs for sequencing and transitioning between directions for rocking Standing x30 sec without UE support, min A to maintain balance, VCs for keeping feet far apart                       PT Education - 01/04/18 1001    Education Details  balance, exercise technique    Person(s) Educated  Patient    Methods  Explanation;Demonstration;Verbal cues    Comprehension  Verbalized understanding;Returned demonstration;Verbal cues required       PT Short Term Goals - 12/07/17 1304      PT SHORT TERM GOAL #1   Title  Patient will be independent in home exercise program to improve strength/mobility for better functional independence with ADLs.    Time  3    Period  Weeks    Status  New    Target Date  12/28/17        PT Long Term Goals - 12/07/17 1307      PT LONG TERM GOAL #1   Title  Pt will improve Berg Balance Assessment score by 7 points to decrease fall risk in home and community environment.     Baseline  12/07/17: 31/56    Time  6    Period  Weeks    Status  New    Target Date  01/18/18      PT LONG TERM GOAL #2   Title  Pt will improve B hip flexion strength to 4+/5 for improvements in functional strength for independent gait, increased standing tolerance, and increased ADL ability.    Baseline  12/07/17: L hip 3+/5, R hip 4-/5    Time  6    Period  Weeks    Status  New    Target Date  01/18/18      PT LONG TERM GOAL #3   Title  Pt will increase gait speed by 0.35 m/s to improve ambulation in the home and community environment and to reduce fall risk.     Baseline  12/07/17: 0.65 m/s    Time  6    Period  Weeks    Status  New    Target Date  01/18/18      PT LONG TERM GOAL #4   Title  Pt will decrease 5 times sit-to-stand time to less than 15 sec without UE support to demonstrate decreased fall risk and increased LE strength and endurance.    Baseline  12/07/17: 13.2 seconds  with RUE support    Time  6    Period  Weeks  Status  New    Target Date  01/18/18            Plan - 01/04/18 1151    Clinical Impression Statement  Patient tolerated therapy session well. Pt continues to have excessive forward trunk lean that increases with fatigue. Pt performed balance challenges with increased time between seated rest breaks to work on activity tolerance; required VCs for proper exercise technique and sequencing. Pt requires CGA-min A for all standing activities and VCs and demonstration for all exercises. Pt will benefit from continued skilled PT intervention for improvements in balance, strength, and gait safety.     Rehab Potential  Fair    Clinical Impairments Affecting Rehab Potential  dementia, fall history, comorbidities    PT Frequency  1x / week    PT Duration  6 weeks    PT Treatment/Interventions  Cryotherapy;Electrical Stimulation;Moist Heat;Ultrasound;Gait training;Stair training;DME Instruction;Functional mobility training;Therapeutic activities;Therapeutic exercise;Balance training;Neuromuscular re-education;Patient/family education;Manual techniques;Energy conservation    PT Next Visit Plan  HEP, balance and gait training    PT Home Exercise Plan  standing marching, heel raises, tandem stance all at kitchen counter    Consulted and Agree with Plan of Care  Patient;Family member/caregiver    Family Member Consulted  Wife       Patient will benefit from skilled therapeutic intervention in order to improve the following deficits and impairments:  Abnormal gait, Decreased activity tolerance, Decreased balance, Decreased cognition, Decreased coordination, Decreased endurance, Decreased knowledge of precautions, Decreased knowledge of use of DME, Decreased mobility, Decreased safety awareness, Decreased strength, Difficulty walking, Impaired perceived functional ability, Improper body mechanics, Postural dysfunction  Visit Diagnosis: History of  falling  Muscle weakness (generalized)  Other abnormalities of gait and mobility  Unsteadiness on feet     Problem List There are no active problems to display for this patient.  Dossie Arbour, SPT This entire session was performed under direct supervision and direction of a licensed therapist/therapist assistant . I have personally read, edited and approve of the note as written.  Trotter,Margaret PT, DPT 01/04/2018, 2:16 PM  [facility] MAIN [facility] SERVICES [address] [place], [place], [address] Phone: [phone]   Fax:  [phone]  Name: Chad Mcfarland [facility] Sr. MRN: [id number] Date of Birth: 26-Nov-1933

## 2018-01-11 ENCOUNTER — Ambulatory Visit: Payer: PRIVATE HEALTH INSURANCE | Admitting: Physical Therapy

## 2018-01-11 ENCOUNTER — Ambulatory Visit: Payer: Medicare Other | Admitting: Physical Therapy

## 2018-01-11 ENCOUNTER — Encounter: Payer: Self-pay | Admitting: Physical Therapy

## 2018-01-11 DIAGNOSIS — Z9181 History of falling: Secondary | ICD-10-CM

## 2018-01-11 DIAGNOSIS — M6281 Muscle weakness (generalized): Secondary | ICD-10-CM

## 2018-01-11 DIAGNOSIS — R2681 Unsteadiness on feet: Secondary | ICD-10-CM

## 2018-01-11 DIAGNOSIS — R2689 Other abnormalities of gait and mobility: Secondary | ICD-10-CM

## 2018-01-11 NOTE — Therapy (Addendum)
[facility] [name] MAIN [facility] SERVICES [address] [place], [place], [address] Phone: [phone]   Fax:  [phone]  Physical Therapy Treatment  Patient Details  Name: Chad Soulliere [place]iland Sr. MRN: [id number] Date of Birth: 05/19/1933 Referring Provider: Dr. Gilmore Laroche   Encounter Date: 01/11/2018  PT End of Session - 01/11/18 0908    Visit Number  5    Number of Visits  7    Date for PT Re-Evaluation  01/18/18    Authorization Type  progress note 5/10    PT Start Time  0900    PT Stop Time  0945    PT Time Calculation (min)  45 min    Equipment Utilized During Treatment  Gait belt    Activity Tolerance  Patient tolerated treatment well    Behavior During Therapy  WFL for tasks assessed/performed       Past Medical History:  Diagnosis Date  . Coronary artery disease   . Depression   . Diabetes mellitus without complication (HCC)   . Seizures (HCC)   . Stroke (HCC)   . Vascular dementia     Past Surgical History:  Procedure Laterality Date  . CARDIAC SURGERY      There were no vitals filed for this visit.  Subjective Assessment - 01/11/18 0906    Subjective  Patient denies any pain today and reports he is doing alright. Pt's wife reports 2 falls in the last week; had to have assistance in getting pt out of the floor. Pt's wife states it is not abnormal for him to have frequent falls. Pt reports he has not been doing exercises at home but has been doing them "over here and walking."    Patient is accompained by:  Family member   Wife   Pertinent History  Chad [name] is a 84 yo male with PMH significant for CVA, seizure disorder, depression, CAD, GERD, vascular dementia with behavioral disturbance, and T2DM. Pt presents to therapy today due to abnormality of gait and frequent falls. Pt acknolwedges he has some issues with walking and falling as well. Pt currently walks with [facility] occassionally but does not use it in the home. Pt is required to use  [employer] at adult daycare; often carries it around the house rather than using it.     Limitations  Walking;Standing    How long can you sit comfortably?  NA    How long can you stand comfortably?  About 10 minutes or so    How long can you walk comfortably?  Walk about 10-12 minutes and then very fatigued following walk    Patient Stated Goals  "I'd like to be able to stand longer and walk longer."    Currently in Pain?  No/denies    Multiple Pain Sites  No       Treatment Warm up on Nustep level 2 x4 min (unbilled);  Gait around the gym with Chad [name] x80 ft, CGA for safety with VCs for turning and keeping all the legs of the quad cane on the ground when taking a step  Toe taps onto 6 inch step x5 reps each leg with BUE support  Toe taps onto 6 inch step x5 reps each leg without UE support, CGA-min A required for maintaining balance, VCs for upright posture  Alternating marching x10 reps each leg without UE support, min A for balance, VCs for lifting the foot all the way up and moving more slowly    Standing rest  break x1 min without UE support, VCs to maintain upright posture and stand tall, supervision for safety    Forwards walking in // bars without UE support x3 laps, VCs to maintain wide BOS and keep tape between feet, CGA for safety   Backwards walking in // bars without UE support x3 laps, VCs to slow down and maintain wide BOS, min-mod A for LOB posteriorly due to increased posterior trunk lean  Tandem stance on ground x15 sec holds with each foot in rear, CGA-min A for balance, VCs to maintain upright posture and not lean too far forwards   Balloon passes with pt standing on floor in // bars, supervision for safety, VCs for reaching across body and outside BOS   Side stepping in // bars x2 laps, faded UE support, CGA for safety, VCs for taking big steps and keeping toes pointed forwards   Airex pad in // bars, CGA-min A for all activities for safety and maintaining  balance: Marching x10 reps each leg with RUE support Mini squats x15 reps without UE support Heel raises x15 reps bilaterally without UE support  -Required VCs for proper technique and positioning as well as engagement in activities; demonstration utilized prior to beginning each new activity  Sit <> stand practice from elevated mat table, x10 reps without UE support, CGA for safety with VCs to stand tall and sit all the way back down  Sit <> stand practice from average height mat table x10 reps without UE support, CGA for safety with VCs to maintain forward trunk lean and not push on thighs when coming to stand as well as to slow down for better strengthening  Sit <> stands from blue chair with arm rests, x10 reps without UE support, VCs for standing all the way up and maintaining upright posture, CGA for safety     PT Education - 01/11/18 0907    Education Details  balance, exercise technique/form    Person(s) Educated  Patient    Methods  Explanation;Demonstration;Verbal cues    Comprehension  Verbalized understanding;Returned demonstration;Verbal cues required;Need further instruction       PT Short Term Goals - 12/07/17 1304      PT SHORT TERM GOAL #1   Title  Patient will be independent in home exercise program to improve strength/mobility for better functional independence with ADLs.    Time  3    Period  Weeks    Status  New    Target Date  12/28/17        PT Long Term Goals - 12/07/17 1307      PT LONG TERM GOAL #1   Title  Pt will improve Berg Balance Assessment score by 7 points to decrease fall risk in home and community environment.     Baseline  12/07/17: 31/56    Time  6    Period  Weeks    Status  New    Target Date  01/18/18      PT LONG TERM GOAL #2   Title  Pt will improve B hip flexion strength to 4+/5 for improvements in functional strength for independent gait, increased standing tolerance, and increased ADL ability.    Baseline  12/07/17: L hip 3+/5,  R hip 4-/5    Time  6    Period  Weeks    Status  New    Target Date  01/18/18      PT LONG TERM GOAL #3   Title  Pt will increase  gait speed by 0.35 m/s to improve ambulation in the home and community environment and to reduce fall risk.     Baseline  12/07/17: 0.65 m/s    Time  6    Period  Weeks    Status  New    Target Date  01/18/18      PT LONG TERM GOAL #4   Title  Pt will decrease 5 times sit-to-stand time to less than 15 sec without UE support to demonstrate decreased fall risk and increased LE strength and endurance.    Baseline  12/07/17: 13.2 seconds with RUE support    Time  6    Period  Weeks    Status  New    Target Date  01/18/18            Plan - 01/11/18 0953    Clinical Impression Statement  Patient tolerated therapy session well. Pt demonstrated increased standing tolerance with standing rest breaks between exercises and fewer seated rest breaks. Pt performed balance challenges as well as dynamic postural control activities; required CGA-min A for all activities with min A required to recover minor LOB most often in the posterior direction. Pt continues to require demonstration for all activities and VCs for proper technique and positioning. Pt demonstrated ability to stand from chair without utilizing UEs; required VCs to slow pace and stand all the way upright prior to sitting back down. Pt will benefit from continued skilled PT intervention for improvements in balance, strength, and gait safety.     Rehab Potential  Fair    Clinical Impairments Affecting Rehab Potential  dementia, fall history, comorbidities    PT Frequency  1x / week    PT Duration  6 weeks    PT Treatment/Interventions  Cryotherapy;Electrical Stimulation;Moist Heat;Ultrasound;Gait training;Stair training;DME Instruction;Functional mobility training;Therapeutic activities;Therapeutic exercise;Balance training;Neuromuscular re-education;Patient/family education;Manual techniques;Energy  conservation    PT Next Visit Plan  HEP, balance and gait training    PT Home Exercise Plan  standing marching, heel raises, tandem stance all at kitchen counter    Consulted and Agree with Plan of Care  Patient;Family member/caregiver    Family Member Consulted  Wife       Patient will benefit from skilled therapeutic intervention in order to improve the following deficits and impairments:  Abnormal gait, Decreased activity tolerance, Decreased balance, Decreased cognition, Decreased coordination, Decreased endurance, Decreased knowledge of precautions, Decreased knowledge of use of DME, Decreased mobility, Decreased safety awareness, Decreased strength, Difficulty walking, Impaired perceived functional ability, Improper body mechanics, Postural dysfunction  Visit Diagnosis: History of falling  Muscle weakness (generalized)  Other abnormalities of gait and mobility  Unsteadiness on feet     Problem List There are no active problems to display for this patient.  Dossie Arbour, SPT This entire session was performed under direct supervision and direction of a licensed therapist/therapist assistant . I have personally read, edited and approve of the note as written.  Trotter,Margaret PT, DPT 01/11/2018, 11:19 AM  [place] [facility] MAIN [facility] SERVICES [address] [place], [place], [address] Phone: [phone]   Fax:  [phone]  Name: Chad Annen [name] Sr. MRN: [id number] Date of Birth: [birth year]

## 2018-01-18 ENCOUNTER — Ambulatory Visit: Payer: Medicare Other | Admitting: Physical Therapy

## 2018-01-18 ENCOUNTER — Ambulatory Visit: Payer: PRIVATE HEALTH INSURANCE | Admitting: Physical Therapy

## 2018-01-18 ENCOUNTER — Encounter: Payer: Self-pay | Admitting: Physical Therapy

## 2018-01-18 DIAGNOSIS — Z9181 History of falling: Secondary | ICD-10-CM

## 2018-01-18 DIAGNOSIS — M6281 Muscle weakness (generalized): Secondary | ICD-10-CM

## 2018-01-18 DIAGNOSIS — R2681 Unsteadiness on feet: Secondary | ICD-10-CM

## 2018-01-18 DIAGNOSIS — R2689 Other abnormalities of gait and mobility: Secondary | ICD-10-CM

## 2018-01-18 NOTE — Therapy (Addendum)
[place] MAIN [facility] SERVICES [address] [place], [place], [address] Phone: [phone]   Fax:  [phone]  Physical Therapy Treatment/Progress Note   Dates of reporting period  12/07/2017   to   01/18/2018   Patient Details  Name: Chad Nohr [place] Sr. MRN: [id number] Date of Birth: February 14, 1934 Referring Provider: Dr. Charletta Cousin   Encounter Date: 01/18/2018  PT End of Session - 01/18/18 0952    Visit Number  6    Number of Visits  11    Date for PT Re-Evaluation  02/15/18    Authorization Type  progress note 1/10    PT Start Time  1015    PT Stop Time  1100    PT Time Calculation (min)  45 min    Equipment Utilized During Treatment  Gait belt    Activity Tolerance  Patient tolerated treatment well    Behavior During Therapy  WFL for tasks assessed/performed       Past Medical History:  Diagnosis Date  . Coronary artery disease   . Depression   . Diabetes mellitus without complication ([place])   . Seizures ([place])   . Stroke ([place])   . Vascular dementia     Past Surgical History:  Procedure Laterality Date  . CARDIAC SURGERY      There were no vitals filed for this visit.  Subjective Assessment - 01/18/18 1018    Subjective  Patient denies any pain today and reports he is doing alright. Pt has a bandaid on forehead above R eye; reports he doesn't think he hit his head or fell when he got this bump.     Patient is accompained by:  Family member   Wife   Pertinent History  Chad [name] is a 84 yo male with PMH significant for CVA, seizure disorder, depression, CAD, GERD, vascular dementia with behavioral disturbance, and T2DM. Pt presents to therapy today due to abnormality of gait and frequent falls. Pt acknolwedges he has some issues with walking and falling as well. Pt currently walks with [facility] occassionally but does not use it in the home. 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      Limitations  Walking;Standing    How long can you sit comfortably?  NA    How long can you stand comfortably?  About 10 minutes or so    How long can you walk comfortably?  Walk about 10-12 minutes and then very fatigued following walk    Patient Stated Goals  "I'd like to be able to stand longer and walk longer."    Currently in Pain?  No/denies    Multiple Pain Sites  No         [facility] PT Assessment - 01/18/18 0001      Strength   Right Hip Flexion  4/5    Left Hip Flexion  4-/5      Standardized Balance Assessment   Five times sit to stand comments   16.4 sec without UE support (indicates increased fall risk)    10 Meter Walk  0.90 m/s carrying SBQC (11.1 sec)      Berg Balance Test   Sit to Stand  Able to stand without using hands and stabilize independently    Standing Unsupported  Able to stand 2 minutes with supervision    Sitting with Back Unsupported but Feet Supported on Floor or Stool  Able to sit  safely and securely 2 minutes    Stand to Sit  Sits safely with minimal use of hands    Transfers  Able to transfer safely, definite need of hands    Standing Unsupported with Eyes Closed  Able to stand 10 seconds with supervision    Standing Ubsupported with Feet Together  Able to place feet together independently and stand for 1 minute with supervision    From Standing, Reach Forward with Outstretched Arm  Can reach forward >12 cm safely (5")    From Standing Position, Pick up Object from [place] to pick up shoe safely and easily    From Standing Position, Turn to Look Behind Over each Shoulder  Looks behind one side only/other side shows less weight shift    Turn 360 Degrees  Able to turn 360 degrees safely but slowly    Standing Unsupported, Alternately Place Feet on Step/Stool  Able to complete >2 steps/needs minimal assist    Standing Unsupported, One Foot in Front  Able to take small step independently and hold 30 seconds    Standing on One Leg  Able to lift leg  independently and hold equal to or more than 3 seconds    Total Score  41    Berg comment:  Increased fall risk         Treatment Reaching/squatting exercise retrieving cones from 6 inch step and handing to therapist, returning back to step x6 cones; CGA for safety, no LOB noted with VCs to bend at the knees  Reaching/squatting exercise retrieving cones from ground and handing to therapist, returning back to ground x6 cones; CGA for safety, no LOB noted with VCs to bend at the knees  Reaching/squatting standing on airex pad, retrieving cones from ground and handing to therapist, returning back to ground x6 cones;  CGA-min A for safety, slight anterior lean with some reaches and required min A to correct with VCs to bend at the knees rather than trying to lean totally forward with the trunk  Marching on airex pad x10 reps each leg without UE support, CGA-min A for stability, VCs for lifting knee all the way up and slowing down speed            PT Education - 01/18/18 0951    Education Details  goal progression, plan of care, exercise technique/form    Person(s) Educated  Patient    Methods  Explanation;Demonstration;Verbal cues    Comprehension  Verbalized understanding;Returned demonstration;Verbal cues required;Need further instruction       PT Short Term Goals - 01/18/18 1126      PT SHORT TERM GOAL #1   Title  Patient will be independent in home exercise program to improve strength/mobility for better functional independence with ADLs.    Baseline  01/18/18: inconsistent at home but reports exercising and walking at daycare and is willing to try to perform HEP on days he does not go to daycare    Time  2    Period  Weeks    Status  On-going    Target Date  02/01/18        PT Long Term Goals - 01/18/18 1126      PT LONG TERM GOAL #1   Title  Pt will improve Berg Balance Assessment score by 7 points to decrease fall risk in home and community environment.      Baseline  12/07/17: 31/56; 01/18/18: 41/56    Time  6    Period  Weeks    Status  Achieved      PT LONG TERM GOAL #2   Title  Pt will improve B hip flexion strength to 4+/5 for improvements in functional strength for independent gait, increased standing tolerance, and increased ADL ability.    Baseline  12/07/17: L hip 3+/5, R hip 4-/5; 01/18/18: L hip 4-/5, R hip 4/5    Time  4    Period  Weeks    Status  On-going    Target Date  02/15/18      PT LONG TERM GOAL #3   Title  Pt will increase gait speed by 0.35 m/s to improve ambulation in the home and community environment and to reduce fall risk.     Baseline  12/07/17: 0.65 m/s; 01/18/18: 0.90 m/s    Time  4    Period  Weeks    Status  Partially Met    Target Date  02/15/18      PT LONG TERM GOAL #4   Title  Pt will decrease 5 times sit-to-stand time to less than 15 sec without UE support to demonstrate decreased fall risk and increased LE strength and endurance.    Baseline  12/07/17: 13.2 seconds with RUE support; 01/18/18: 16.4 sec without UE support    Time  4    Period  Weeks    Status  Partially Met    Target Date  02/15/18            Plan - 01/18/18 1119    Clinical Impression Statement  Patient tolerated therapy session well. Pt instructed in completing outcome measures including 5xSTS, 10MWT, and Berg Balance Assessment. Pt demonstrated significant improvement in gait speed, 5xSTS time, and Berg balance score. Pt is still an increased fall risk based on current outcome measures. Pt also demonstrated improved B hip flexion strength compared to initial evaluation. Pt performed reaching and squatting exercises following wife's reports pt's most recent fall occurred when reaching down to plug something into the wall. Pt and pt's wife agreeable to continuing therapy for 4 more weeks to continue gains in balance, strength, and gait safety. Pt will continue to benefit from skilled PT intervention for improvements in balance, strength, and  gait safety. Patient's condition has the potential to improve in response to therapy. Maximum improvement is yet to be obtained. The anticipated improvement is attainable and reasonable in a generally predictable time.  Patient reports feeling as though his balance has improved since coming to therapy and feels like his L leg in particular is getting stronger.     Rehab Potential  Fair    Clinical Impairments Affecting Rehab Potential  dementia, fall history, comorbidities    PT Frequency  1x / week    PT Duration  4 weeks    PT Treatment/Interventions  Cryotherapy;Electrical Stimulation;Moist Heat;Ultrasound;Gait training;Stair training;DME Instruction;Functional mobility training;Therapeutic activities;Therapeutic exercise;Balance training;Neuromuscular re-education;Patient/family education;Manual techniques;Energy conservation    PT Next Visit Plan  HEP, balance and gait training    PT Home Exercise Plan  standing marching, heel raises, tandem stance all at kitchen counter    Consulted and Agree with Plan of Care  Patient;Family member/caregiver    Family Member Consulted  Wife       Patient will benefit from skilled therapeutic intervention in order to improve the following deficits and impairments:  Abnormal gait, Decreased activity tolerance, Decreased balance, Decreased cognition, Decreased coordination, Decreased endurance, Decreased knowledge of precautions, Decreased knowledge of use of DME, Decreased mobility, Decreased  safety awareness, Decreased strength, Difficulty walking, Impaired perceived functional ability, Improper body mechanics, Postural dysfunction  Visit Diagnosis: History of falling  Muscle weakness (generalized)  Other abnormalities of gait and mobility  Unsteadiness on feet     Problem List There are no active problems to display for this patient.  Harriet Masson, SPT This entire session was performed under direct supervision and direction of a licensed  therapist/therapist assistant . I have personally read, edited and approve of the note as written.  Trotter,Margaret  PT, DPT 01/18/2018, 11:54 AM  [place] MAIN [facility] SERVICES [address] [place], [place], [address] Phone: [phone]   Fax:  [phone]  Name: Chad [name] [facility] Sr. MRN: [id number] Date of Birth: 10-Jan-1934

## 2018-01-25 ENCOUNTER — Ambulatory Visit: Payer: Medicare Other | Admitting: Physical Therapy

## 2018-01-25 ENCOUNTER — Encounter: Payer: Self-pay | Admitting: Physical Therapy

## 2018-01-25 ENCOUNTER — Ambulatory Visit: Payer: PRIVATE HEALTH INSURANCE | Admitting: Physical Therapy

## 2018-01-25 DIAGNOSIS — M6281 Muscle weakness (generalized): Secondary | ICD-10-CM

## 2018-01-25 DIAGNOSIS — Z9181 History of falling: Secondary | ICD-10-CM

## 2018-01-25 DIAGNOSIS — R2689 Other abnormalities of gait and mobility: Secondary | ICD-10-CM

## 2018-01-25 DIAGNOSIS — R2681 Unsteadiness on feet: Secondary | ICD-10-CM

## 2018-01-25 NOTE — Therapy (Signed)
[place] MAIN [facility] SERVICES [address] [place], [place], [address] Phone: [phone]   Fax:  [phone]  Physical Therapy Treatment  Patient Details  Name: Chad Higinbotham Titzer Sr. MRN: [id number] Date of Birth: 09-07-1933 Referring Provider: Dr. Charletta Cousin   Encounter Date: 01/25/2018  PT End of Session - 01/25/18 1026    Visit Number  7    Number of Visits  11    Date for PT Re-Evaluation  02/15/18    Authorization Type  progress note 2/10    PT Start Time  1016    PT Stop Time  1100    PT Time Calculation (min)  44 min    Equipment Utilized During Treatment  Gait belt    Activity Tolerance  Patient tolerated treatment well    Behavior During Therapy  WFL for tasks assessed/performed       Past Medical History:  Diagnosis Date  . Coronary artery disease   . Depression   . Diabetes mellitus without complication ([place])   . Seizures ([place])   . Stroke ([place])   . Vascular dementia     Past Surgical History:  Procedure Laterality Date  . CARDIAC SURGERY      There were no vitals filed for this visit.  Subjective Assessment - 01/25/18 1025    Subjective  Patient reports doing well; denies any pain; reports no new falls;     Patient is accompained by:  Family member   Wife   Pertinent History  Chad [name] is a 84 yo male with PMH significant for CVA, seizure disorder, depression, CAD, GERD, vascular dementia with behavioral disturbance, and T2DM. Pt presents to therapy today due to abnormality of gait and frequent falls. Pt acknolwedges he has some issues with walking and falling as well. Pt currently walks with [facility] occassionally but does not use it in the home. 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     Limitations  Walking;Standing    How long can you sit comfortably?  NA    How long can you stand comfortably?  About 10 minutes or so    How long can you walk comfortably?   Walk about 10-12 minutes and then very fatigued following walk    Patient Stated Goals  "I'd like to be able to stand longer and walk longer."    Currently in Pain?  No/denies    Multiple Pain Sites  No          Treatment Warm up on Nustep level 2 x4 min (unbilled); Balance: Standing on airex pad: -Toe taps onto 6 inch step x5 reps each leg with 1UE support  -Toe taps onto 6 inch step x10 reps each leg without UE support, CGA-min A required for maintaining balance, VCs for upright posture -Standing one foot on airex, one foot on 6 inch step, BUE ball pass side/side x5 reps each foot on step with minA for safety and cues to improve weight shift and erect posture for better stance control;    Forwards walking outside // bars without UE support x3 laps, VCs to maintain wide BOS and keep tape between feet, CGA for safety    Backwards walking outside // bars without UE support x3 laps, VCs to slow down and maintain wide BOS, min-mod A for LOB posteriorly due to increased posterior trunk lean  Progressed to resisted walking 7.5# forward/backward with patient carrying SBQC x2  laps, min A for safety and mod VCs to slow down eccentric return with backwards walking, increase step length and improve weight shift for better dynamic balance;  Strengthening: Standing in parallel bars with 2# ankle weights on BLE: Hip flexion march x15 reps bilaterally Hip abduction x15 reps bilaterally; Hamstring curl x15 reps bilaterally; Heel raises x15 reps bilaterally; Side stepping in // bars x3 laps, faded UE support, CGA for safety, VCs for taking big steps and keeping toes pointed forwards  Required mod VCs with all exercise to increase ROM and slow down LE movement for better LE strengthening;    Sit <> stands from blue chair without UE support, with BUE ball push up x10 reps VCs for standing all the way up and maintaining upright posture, CGA for safety                        PT  Education - 01/25/18 1025    Education Details  LE strengthening, balance, gait safety; HEP reinforced;     Person(s) Educated  Patient    Methods  Explanation;Demonstration;Verbal cues    Comprehension  Verbalized understanding;Returned demonstration;Verbal cues required;Need further instruction       PT Short Term Goals - 01/18/18 1126      PT SHORT TERM GOAL #1   Title  Patient will be independent in home exercise program to improve strength/mobility for better functional independence with ADLs.    Baseline  01/18/18: inconsistent at home but reports exercising and walking at daycare and is willing to try to perform HEP on days he does not go to daycare    Time  2    Period  Weeks    Status  On-going    Target Date  02/01/18        PT Long Term Goals - 01/18/18 1126      PT LONG TERM GOAL #1   Title  Pt will improve Berg Balance Assessment score by 7 points to decrease fall risk in home and community environment.     Baseline  12/07/17: 31/56; 01/18/18: 41/56    Time  6    Period  Weeks    Status  Achieved      PT LONG TERM GOAL #2   Title  Pt will improve B hip flexion strength to 4+/5 for improvements in functional strength for independent gait, increased standing tolerance, and increased ADL ability.    Baseline  12/07/17: L hip 3+/5, R hip 4-/5; 01/18/18: L hip 4-/5, R hip 4/5    Time  4    Period  Weeks    Status  On-going    Target Date  02/15/18      PT LONG TERM GOAL #3   Title  Pt will increase gait speed by 0.35 m/s to improve ambulation in the home and community environment and to reduce fall risk.     Baseline  12/07/17: 0.65 m/s; 01/18/18: 0.90 m/s    Time  4    Period  Weeks    Status  Partially Met    Target Date  02/15/18      PT LONG TERM GOAL #4   Title  Pt will decrease 5 times sit-to-stand time to less than 15 sec without UE support to demonstrate decreased fall risk and increased LE strength and endurance.    Baseline  12/07/17: 13.2 seconds with RUE  support; 01/18/18: 16.4 sec without UE support    Time  4  Period  Weeks    Status  Partially Met    Target Date  02/15/18            Plan - 01/25/18 1040    Clinical Impression Statement  Patient tolerated session well. Instructed patient in advanced balance activities with less rail assist on compliant surfaces. He required increased cues to improve erect posture and weight shift for better stance control. Advanced LE strengthening with increased resistance/repetition. Patient required several sitting rest breaks due to fatigue. He would benefit from additional skilled PT intervention to improve strength, balance and gait safety;     Rehab Potential  Fair    Clinical Impairments Affecting Rehab Potential  dementia, fall history, comorbidities    PT Frequency  1x / week    PT Duration  4 weeks    PT Treatment/Interventions  Cryotherapy;Electrical Stimulation;Moist Heat;Ultrasound;Gait training;Stair training;DME Instruction;Functional mobility training;Therapeutic activities;Therapeutic exercise;Balance training;Neuromuscular re-education;Patient/family education;Manual techniques;Energy conservation    PT Next Visit Plan  HEP, balance and gait training    PT Home Exercise Plan  standing marching, heel raises, tandem stance all at kitchen counter    Consulted and Agree with Plan of Care  Patient;Family member/caregiver    Family Member Consulted  Wife       Patient will benefit from skilled therapeutic intervention in order to improve the following deficits and impairments:  Abnormal gait, Decreased activity tolerance, Decreased balance, Decreased cognition, Decreased coordination, Decreased endurance, Decreased knowledge of precautions, Decreased knowledge of use of DME, Decreased mobility, Decreased safety awareness, Decreased strength, Difficulty walking, Impaired perceived functional ability, Improper body mechanics, Postural dysfunction  Visit Diagnosis: History of  falling  Muscle weakness (generalized)  Unsteadiness on feet  Other abnormalities of gait and mobility     Problem List There are no active problems to display for this patient.   Keagan Anthis PT, DPT 01/25/2018, 10:47 AM  [place] MAIN [facility] [place] [facility] SERVICES [address] [place], [place], [address] Phone: [phone]   Fax:  [phone]  Name: Chad Fortunato [facility] Sr. MRN: [id number] Date of Birth: 30-Jun-1933

## 2018-02-01 ENCOUNTER — Ambulatory Visit: Payer: Medicare Other | Attending: Geriatric Medicine | Admitting: Physical Therapy

## 2018-02-01 ENCOUNTER — Encounter: Payer: Self-pay | Admitting: Physical Therapy

## 2018-02-01 DIAGNOSIS — Z9181 History of falling: Secondary | ICD-10-CM | POA: Insufficient documentation

## 2018-02-01 DIAGNOSIS — R2681 Unsteadiness on feet: Secondary | ICD-10-CM | POA: Diagnosis present

## 2018-02-01 DIAGNOSIS — R2689 Other abnormalities of gait and mobility: Secondary | ICD-10-CM

## 2018-02-01 DIAGNOSIS — M6281 Muscle weakness (generalized): Secondary | ICD-10-CM | POA: Insufficient documentation

## 2018-02-01 NOTE — Therapy (Signed)
[place] MAIN [facility] SERVICES [address] [place], [place], [address] Phone: [phone]   Fax:  [phone]  Physical Therapy Treatment  Patient Details  Name: Chad Coppedge [place] Sr. MRN: [id number] Date of Birth: November 08, 1933 Referring Provider (PT): Dr. Charletta Cousin   Encounter Date: 02/01/2018  PT End of Session - 02/01/18 0954    Visit Number  8    Number of Visits  11    Date for PT Re-Evaluation  02/15/18    Authorization Type  progress note 3/10    PT Start Time  1015    PT Stop Time  1100    PT Time Calculation (min)  45 min    Equipment Utilized During Treatment  Gait belt    Activity Tolerance  Patient tolerated treatment well    Behavior During Therapy  WFL for tasks assessed/performed       Past Medical History:  Diagnosis Date  . Coronary artery disease   . Depression   . Diabetes mellitus without complication ([place])   . Seizures ([place])   . Stroke ([place])   . Vascular dementia [facility])     Past Surgical History:  Procedure Laterality Date  . CARDIAC SURGERY      There were no vitals filed for this visit.  Subjective Assessment - 02/01/18 1024    Subjective  Patient reports no falls with injury, but is unsure if he's fallen since his last visit. Patient states that he is a little tired today.    Patient is accompained by:  --   Wife   Pertinent History  [name] is a 84 yo male with PMH significant for CVA, seizure disorder, depression, CAD, GERD, vascular dementia with behavioral disturbance, and T2DM. Pt presents to therapy today due to abnormality of gait and frequent falls. Pt acknolwedges he has some issues with walking and falling as well. Pt currently walks with [facility] occassionally but does not use it in the home. 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     Limitations  Walking;Standing    How long can you sit comfortably?  NA    How long can you stand  comfortably?  About 10 minutes or so    How long can you walk comfortably?  Walk about 10-12 minutes and then very fatigued following walk    Patient Stated Goals  "I'd like to be able to stand longer and walk longer."    Currently in Pain?  No/denies            Treatment Warm up on Nustep level 2 x4 min (unbilled); Patient reports some fatigue in the L UE at completion.  Balance: Standing on airex pad: -Toe taps onto 6 inch step x10 reps each leg without UE support, CGA-min A required for maintaining balance, VCs for upright posture. LOB x 4 (R>L) with UE support and PT assistance for recovery. -BUE overhead reach x10 using PVC, CGA-min A required for maintaining balance, VCs for wide base of support. -Head turns (lateral and vertical) without UE support, CGA-min A required for maintaining balance -Balloon pass with BUE, CGA-min A required for maintaining balance, VCs to avoid posterior lean and use BUE  Standing on level surface: -single leg stance x4 with <3 sec hold, CGA-min A required for maintaining balance, VCs for upright posture.   Forwards walking inside // bars without UE support x3 laps, VCs to maintain stride length, CGA  for safety   Backwards walking inside // bars without UE support x3 laps, VCs to slow down and maintain wide BOS, min-mod A for LOB posteriorly due to increased posterior trunk lean   Strengthening: Standing in parallel bars with 2# ankle weights on BLE: Hip flexion march x15 reps bilaterally Hip abduction x5 reps bilaterally; Hamstring curl x10 reps bilaterally; Heel raises x15 reps bilaterally; Side stepping in // bars x1 laps, CGA for safety, VCs for taking big steps and keeping toes pointed forwards  Required mod VCs with all exercise to increase ROM and slow down LE movement for better LE strengthening. Patient demonstrates significant compensations in trunk position during B LE exercises.     PT Education - 02/01/18 0953    Education  Details  LE strengthening, exercise form/technique, balance, gait    Person(s) Educated  Patient    Methods  Explanation;Demonstration;Verbal cues    Comprehension  Verbalized understanding;Returned demonstration;Verbal cues required;Need further instruction       PT Short Term Goals - 01/18/18 1126      PT SHORT TERM GOAL #1   Title  Patient will be independent in home exercise program to improve strength/mobility for better functional independence with ADLs.    Baseline  01/18/18: inconsistent at home but reports exercising and walking at daycare and is willing to try to perform HEP on days he does not go to daycare    Time  2    Period  Weeks    Status  On-going    Target Date  02/01/18        PT Long Term Goals - 01/18/18 1126      PT LONG TERM GOAL #1   Title  Pt will improve Berg Balance Assessment score by 7 points to decrease fall risk in home and community environment.     Baseline  12/07/17: 31/56; 01/18/18: 41/56    Time  6    Period  Weeks    Status  Achieved      PT LONG TERM GOAL #2   Title  Pt will improve B hip flexion strength to 4+/5 for improvements in functional strength for independent gait, increased standing tolerance, and increased ADL ability.    Baseline  12/07/17: L hip 3+/5, R hip 4-/5; 01/18/18: L hip 4-/5, R hip 4/5    Time  4    Period  Weeks    Status  On-going    Target Date  02/15/18      PT LONG TERM GOAL #3   Title  Pt will increase gait speed by 0.35 m/s to improve ambulation in the home and community environment and to reduce fall risk.     Baseline  12/07/17: 0.65 m/s; 01/18/18: 0.90 m/s    Time  4    Period  Weeks    Status  Partially Met    Target Date  02/15/18      PT LONG TERM GOAL #4   Title  Pt will decrease 5 times sit-to-stand time to less than 15 sec without UE support to demonstrate decreased fall risk and increased LE strength and endurance.    Baseline  12/07/17: 13.2 seconds with RUE support; 01/18/18: 16.4 sec without UE support     Time  4    Period  Weeks    Status  Partially Met    Target Date  02/15/18            Plan - 02/01/18 1026    Clinical  Impression Statement  Patient presented with increased fatigue today but was amenable to all therapeutic interventions to include: strengthening and balance. Patient demonstrated increased LOB for activities requiring single limb stance R>L, but is able to recover balance with UE assistance and CGA to MIN A. He continues to require consistent cueing throughout activities in standing to minimize posterior trunk lean, widen base of support, and decrease speed of movement to improve safety and decrease risk of falls. Patient will continue to benefit from skilled therapeutic intervention to address deficits in strength and balance in order to improve gait safety and minimize risk of falls.     Rehab Potential  Fair    Clinical Impairments Affecting Rehab Potential  dementia, fall history, comorbidities    PT Frequency  1x / week    PT Duration  4 weeks    PT Treatment/Interventions  Cryotherapy;Electrical Stimulation;Moist Heat;Ultrasound;Gait training;Stair training;DME Instruction;Functional mobility training;Therapeutic activities;Therapeutic exercise;Balance training;Neuromuscular re-education;Patient/family education;Manual techniques;Energy conservation    PT Next Visit Plan  HEP, balance and gait training    PT Home Exercise Plan  standing marching, heel raises, tandem stance all at kitchen counter    Consulted and Agree with Plan of Care  Patient    Family Member Consulted  --       Patient will benefit from skilled therapeutic intervention in order to improve the following deficits and impairments:  Abnormal gait, Decreased activity tolerance, Decreased balance, Decreased cognition, Decreased coordination, Decreased endurance, Decreased knowledge of precautions, Decreased knowledge of use of DME, Decreased mobility, Decreased safety awareness, Decreased strength,  Difficulty walking, Impaired perceived functional ability, Improper body mechanics, Postural dysfunction  Visit Diagnosis: History of falling  Muscle weakness (generalized)  Unsteadiness on feet  Other abnormalities of gait and mobility     Problem List There are no active problems to display for this patient.  Harriet Masson, SPT This entire session was performed under direct supervision and direction of a licensed therapist/therapist assistant . I have personally read, edited and approve of the note as written.  Myles Gip PT, DPT [phone] 02/01/2018, 11:11 AM  [place] MAIN [facility] SERVICES [address] [place], [place], [address] Phone: [phone]   Fax:  [phone]  Name: Asar Evilsizer [facility] Sr. MRN: [id number] Date of Birth: 04/28/1934

## 2018-02-08 ENCOUNTER — Ambulatory Visit: Payer: Medicare Other | Admitting: Physical Therapy

## 2018-02-08 ENCOUNTER — Encounter: Payer: Self-pay | Admitting: Physical Therapy

## 2018-02-08 DIAGNOSIS — Z9181 History of falling: Secondary | ICD-10-CM | POA: Diagnosis not present

## 2018-02-08 DIAGNOSIS — R2689 Other abnormalities of gait and mobility: Secondary | ICD-10-CM

## 2018-02-08 DIAGNOSIS — M6281 Muscle weakness (generalized): Secondary | ICD-10-CM

## 2018-02-08 DIAGNOSIS — R2681 Unsteadiness on feet: Secondary | ICD-10-CM

## 2018-02-08 NOTE — Therapy (Addendum)
[place] MAIN [facility] SERVICES [address] [place], [place], [address] Phone: [phone]   Fax:  [phone]  Physical Therapy Treatment  Patient Details  Name: Chad Milanes Impastato Sr. MRN: [id number] Date of Birth: Aug 25, 1933 Referring Provider (PT): Dr. Charletta Cousin   Encounter Date: 02/08/2018  PT End of Session - 02/08/18 0852    Visit Number  9    Number of Visits  11    Date for PT Re-Evaluation  02/15/18    Authorization Type  progress note 4/10    PT Start Time  0847    PT Stop Time  0930    PT Time Calculation (min)  43 min    Equipment Utilized During Treatment  Gait belt    Activity Tolerance  Patient tolerated treatment well    Behavior During Therapy  WFL for tasks assessed/performed       Past Medical History:  Diagnosis Date  . Coronary artery disease   . Depression   . Diabetes mellitus without complication ([place])   . Seizures ([place])   . Stroke ([place])   . Vascular dementia [facility])     Past Surgical History:  Procedure Laterality Date  . CARDIAC SURGERY      There were no vitals filed for this visit.  Subjective Assessment - 02/08/18 0851    Subjective  Patient had a fall last Tuesday at daycare; reported by pt's wife. Pt reports "I guess my feet just got tangled underneath me."     Patient is accompained by:  --   Wife   Pertinent History  Chad [name] is a 84 yo male with PMH significant for CVA, seizure disorder, depression, CAD, GERD, vascular dementia with behavioral disturbance, and T2DM. Pt presents to therapy today due to abnormality of gait and frequent falls. Pt acknolwedges he has some issues with walking and falling as well. Pt currently walks with [facility] occassionally but does not use it in the home. 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     Limitations  Walking;Standing    How long can you sit comfortably?  NA    How long can you stand comfortably?  About  10 minutes or so    How long can you walk comfortably?  Walk about 10-12 minutes and then very fatigued following walk    Patient Stated Goals  "I'd like to be able to stand longer and walk longer."    Currently in Pain?  No/denies       Treatment  Warm up on Nustep level 2 x4 min with VCs to keep speed around 10 SPM for better cardiovascular challenge  Gait around the gym with [facility] x80 ft, CGA for safety with VCs for turning and keeping all the legs of the quad cane on the ground when taking a step  Toe taps onto 4 inch step x5 reps each leg with RUE support  Toe taps onto 4 inch step x5 reps each leg without UE support, CGA-min A required for maintaining balance, VCs for upright posture   Standing rest break x1 min without UE support on airex pad, VCs to maintain upright posture and stand tall, CGA for safety    Forwards walking in // bars without UE support x3 laps, VCs to maintain wide BOS and keep tape between feet, CGA for safety   Backwards walking in // bars without UE support x3 laps, VCs to slow down  and maintain wide BOS, min-mod A for LOB posteriorly due to increased posterior trunk lean  Tandem stance on ground x20 sec holds with each foot in rear, CGA-min A for balance, VCs to maintain upright posture and not lean too far forwards   Balloon passes with pt standing on floor in // bars x2 min, supervision for safety, VCs for reaching across body and outside BOS   Side stepping in // bars x3 laps, intermittent UE support, CGA for safety, VCs for taking big steps and keeping toes pointed forwards   Airex pad in // bars, CGA-min A for all activities for safety and maintaining balance: Marching x15 reps each leg with RUE support Mini squats x15 reps without UE support Heel raises x15 reps bilaterally without UE support  -Required VCs for proper technique and positioning as well as engagement in activities; demonstration utilized prior to beginning each new  activity                       PT Education - 02/08/18 0852    Education Details  exercise technique/form, balance, LE strengthening, gait safety    Person(s) Educated  Patient    Methods  Explanation;Demonstration;Verbal cues    Comprehension  Verbalized understanding;Returned demonstration;Verbal cues required;Need further instruction       PT Short Term Goals - 01/18/18 1126      PT SHORT TERM GOAL #1   Title  Patient will be independent in home exercise program to improve strength/mobility for better functional independence with ADLs.    Baseline  01/18/18: inconsistent at home but reports exercising and walking at daycare and is willing to try to perform HEP on days he does not go to daycare    Time  2    Period  Weeks    Status  On-going    Target Date  02/01/18        PT Long Term Goals - 01/18/18 1126      PT LONG TERM GOAL #1   Title  Pt will improve Berg Balance Assessment score by 7 points to decrease fall risk in home and community environment.     Baseline  12/07/17: 31/56; 01/18/18: 41/56    Time  6    Period  Weeks    Status  Achieved      PT LONG TERM GOAL #2   Title  Pt will improve B hip flexion strength to 4+/5 for improvements in functional strength for independent gait, increased standing tolerance, and increased ADL ability.    Baseline  12/07/17: L hip 3+/5, R hip 4-/5; 01/18/18: L hip 4-/5, R hip 4/5    Time  4    Period  Weeks    Status  On-going    Target Date  02/15/18      PT LONG TERM GOAL #3   Title  Pt will increase gait speed by 0.35 m/s to improve ambulation in the home and community environment and to reduce fall risk.     Baseline  12/07/17: 0.65 m/s; 01/18/18: 0.90 m/s    Time  4    Period  Weeks    Status  Partially Met    Target Date  02/15/18      PT LONG TERM GOAL #4   Title  Pt will decrease 5 times sit-to-stand time to less than 15 sec without UE support to demonstrate decreased fall risk and increased LE strength  and endurance.    Baseline  12/07/17: 13.2 seconds with RUE support; 01/18/18: 16.4 sec without UE support    Time  4    Period  Weeks    Status  Partially Met    Target Date  02/15/18            Plan - 02/08/18 0911    Clinical Impression Statement  Patient tolerated therapy session well; presented with increased fatigue but amenable to all exercises including balance and strengthening. Pt demonstrated improved ability to perform SLS on airex pad during toe tapping activity; required CGA-min A with VCs for proper technique and sequencing. Pt continues to require constant VCs for slowing down speed for better motor control as well as proper technique for each exercise. Pt will continue to benefit from skilled PT intervention for improvements in balance, strength, and gait safety.      Rehab Potential  Fair    Clinical Impairments Affecting Rehab Potential  dementia, fall history, comorbidities    PT Frequency  1x / week    PT Duration  4 weeks    PT Treatment/Interventions  Cryotherapy;Electrical Stimulation;Moist Heat;Ultrasound;Gait training;Stair training;DME Instruction;Functional mobility training;Therapeutic activities;Therapeutic exercise;Balance training;Neuromuscular re-education;Patient/family education;Manual techniques;Energy conservation    PT Next Visit Plan  HEP, balance and gait training    PT Home Exercise Plan  standing marching, heel raises, tandem stance all at kitchen counter    Consulted and Agree with Plan of Care  Patient       Patient will benefit from skilled therapeutic intervention in order to improve the following deficits and impairments:  Abnormal gait, Decreased activity tolerance, Decreased balance, Decreased cognition, Decreased coordination, Decreased endurance, Decreased knowledge of precautions, Decreased knowledge of use of DME, Decreased mobility, Decreased safety awareness, Decreased strength, Difficulty walking, Impaired perceived functional ability,  Improper body mechanics, Postural dysfunction  Visit Diagnosis: History of falling  Muscle weakness (generalized)  Unsteadiness on feet  Other abnormalities of gait and mobility     Problem List There are no active problems to display for this patient.  Harriet Masson, SPT This entire session was performed under direct supervision and direction of a licensed therapist/therapist assistant . I have personally read, edited and approve of the note as written.  Trotter,Margaret PT, DPT 02/08/2018, 10:07 AM  [place] MAIN [facility] SERVICES [address] [place], [place], [address] Phone: [phone]   Fax:  [phone]  Name: Buck Mcaffee [facility] Sr. MRN: [id number] Date of Birth: [birth year]

## 2018-02-10 ENCOUNTER — Other Ambulatory Visit: Payer: Self-pay | Admitting: Internal Medicine

## 2018-02-10 DIAGNOSIS — W19XXXA Unspecified fall, initial encounter: Secondary | ICD-10-CM

## 2018-02-10 DIAGNOSIS — S060X0A Concussion without loss of consciousness, initial encounter: Secondary | ICD-10-CM

## 2018-02-10 DIAGNOSIS — R269 Unspecified abnormalities of gait and mobility: Secondary | ICD-10-CM

## 2018-02-15 ENCOUNTER — Encounter: Payer: Self-pay | Admitting: Physical Therapy

## 2018-02-15 ENCOUNTER — Ambulatory Visit: Payer: Medicare Other | Admitting: Physical Therapy

## 2018-02-15 DIAGNOSIS — M6281 Muscle weakness (generalized): Secondary | ICD-10-CM

## 2018-02-15 DIAGNOSIS — Z9181 History of falling: Secondary | ICD-10-CM | POA: Diagnosis not present

## 2018-02-15 DIAGNOSIS — R2681 Unsteadiness on feet: Secondary | ICD-10-CM

## 2018-02-15 DIAGNOSIS — R2689 Other abnormalities of gait and mobility: Secondary | ICD-10-CM

## 2018-02-15 NOTE — Patient Instructions (Signed)
Access Code: [email or web address]  URL: [email or web address]  Date: 02/15/2018  Prepared by: Zettie Pho   Exercises  Seated March - 15 reps - 2 sets - 1x daily - 3x weekly  Seated Long Arc Quad - 15 reps - 2 sets - 1x daily - 3x weekly

## 2018-02-15 NOTE — Therapy (Addendum)
[place] MAIN [facility] SERVICES [address] [place], [place], [address] Phone: [phone]   Fax:  [phone]  Physical Therapy Treatment/Discharge Summary  Patient Details  Name: Chad Juhasz Starace Sr. MRN: [id number] Date of Birth: 1933-07-24 Referring Provider (PT): Dr. Charletta Cousin   Encounter Date: 02/15/2018  PT End of Session - 02/15/18 0948    Visit Number  10    Number of Visits  11    Date for PT Re-Evaluation  02/15/18    Authorization Type  progress note 5/10    PT Start Time  0950    PT Stop Time  1035    PT Time Calculation (min)  45 min    Equipment Utilized During Treatment  Gait belt    Activity Tolerance  Patient tolerated treatment well    Behavior During Therapy  WFL for tasks assessed/performed       Past Medical History:  Diagnosis Date  . Coronary artery disease   . Depression   . Diabetes mellitus without complication ([place])   . Seizures ([place])   . Stroke ([place])   . Vascular dementia [facility])     Past Surgical History:  Procedure Laterality Date  . CARDIAC SURGERY      There were no vitals filed for this visit.  Subjective Assessment - 02/15/18 0952    Subjective  Patient states he is doing alright; denies any pain and reports he "doesn't remember" when asked about falling.     Patient is accompained by:  --   Wife   Pertinent History  [name] is a 84 yo male with PMH significant for CVA, seizure disorder, depression, CAD, GERD, vascular dementia with behavioral disturbance, and T2DM. Pt presents to therapy today due to abnormality of gait and frequent falls. Pt acknolwedges he has some issues with walking and falling as well. Pt currently walks with [facility] occassionally but does not use it in the home. 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     Limitations  Walking;Standing    How long can you sit comfortably?  NA    How long can you stand comfortably?   About 10 minutes or so    How long can you walk comfortably?  Walk about 10-12 minutes and then very fatigued following walk    Patient Stated Goals  "I'd like to be able to stand longer and walk longer."    Currently in Pain?  No/denies                [facility] PT Assessment - 02/15/18 0001      Strength   Right Hip Flexion  4/5    Left Hip Flexion  4-/5      Standardized Balance Assessment   Five times sit to stand comments   17.2 sec without UE support indicates increased fall risk     10 Meter Walk  0.77 m/s carrying SBQC (13.0 sec) indicates increased fall risk; slight decrease from 0.90 m/s on 01/18/18      Treatment Warm up on Nustep level 2 x4 min with VCs to maintain SPM close to 70 for better cardiovascular challenge;   Assessed goals including 10 meter walk, 5xSTS, and hip flexion strength.   Instructed in HEP:  -Seated marching x10 reps each leg -Seated LAQ x5 reps each leg with 3 sec hold Pt required VCs for proper technique and to avoid posterior trunk lean  Discussed and educated pt and pt's caregiver on discharge planning; continuing to walk every day at home and at day care to keep up mobility; encouraged pt to get out of bed every day and walk down to playroom to sit in chair and do exercises while watching tv                PT Education - 02/15/18 0947    Education Details  plan of care, recommendations    Person(s) Educated  Patient;Spouse    Methods  Explanation    Comprehension  Verbalized understanding       PT Short Term Goals - 02/15/18 1048      PT SHORT TERM GOAL #1   Title  Patient will be independent in home exercise program to improve strength/mobility for better functional independence with ADLs.    Baseline  01/18/18: inconsistent at home but reports exercising and walking at daycare and is willing to try to perform HEP on days he does not go to daycare; 02/15/18: inconsistent but does perform some days when not at daycare and  always at daycare    Time  2    Period  Weeks    Status  Partially [place] - 02/15/18 [place] #1   Title  Pt will improve Berg Balance Assessment score by 7 points to decrease fall risk in home and community environment.     Baseline  12/07/17: 31/56; 01/18/18: 41/56    Time  6    Period  Weeks    Status  Achieved      PT LONG TERM GOAL #2   Title  Pt will improve B hip flexion strength to 4+/5 for improvements in functional strength for independent gait, increased standing tolerance, and increased ADL ability.    Baseline  12/07/17: L hip 3+/5, R hip 4-/5; 01/18/18: L hip 4-/5, R hip 4/5; 02/15/18: L hip 4-/5, R hip 4/5    Time  4    Period  Weeks    Status  Not Met      PT LONG TERM GOAL #3   Title  Pt will increase gait speed by 0.35 m/s to improve ambulation in the home and community environment and to reduce fall risk.     Baseline  12/07/17: 0.65 m/s; 01/18/18: 0.90 m/s; 02/15/18: 0.77 m/s    Time  4    Period  Weeks    Status  Not Met      PT LONG TERM GOAL #4   Title  Pt will decrease 5 times sit-to-stand time to less than 15 sec without UE support to demonstrate decreased fall risk and increased LE strength and endurance.    Baseline  12/07/17: 13.2 seconds with RUE support; 01/18/18: 16.4 sec without UE support; 02/15/18: 17.2 sec without UE support    Time  4    Period  Weeks    Status  Partially Met            Plan - 02/15/18 1045    Clinical Impression Statement  Patient tolerated therapy session well; demonstrated plateau in all outcome measures compared to last assessment. Pt continues to demonstrate improvements from initial evaluation but has leveled out in progress in the last month. PT educated and discussed d/c and HEP options at length with pt and pt's wife; pt's wife reports pt highly unmotivated to perform exercises at  home and hopes he will be more willing to walk around the house now. Pt agreeable to d/c and states  he will "try" to continue walking and performing HEP. Pt's wife has noted significant improvements in balance and use of SBQC when walking compared to evaluation; has not had a fall in roughly 2 weeks. Pt is being discharged at this time due to plateauing in progress.     Rehab Potential  Fair    Clinical Impairments Affecting Rehab Potential  dementia, fall history, comorbidities    PT Frequency  1x / week    PT Duration  4 weeks    PT Treatment/Interventions  Cryotherapy;Electrical Stimulation;Moist Heat;Ultrasound;Gait training;Stair training;DME Instruction;Functional mobility training;Therapeutic activities;Therapeutic exercise;Balance training;Neuromuscular re-education;Patient/family education;Manual techniques;Energy conservation    PT Next Visit Plan  HEP, balance and gait training    PT Home Exercise Plan  standing marching, heel raises, tandem stance all at kitchen counter    Consulted and Agree with Plan of Care  Patient       Patient will benefit from skilled therapeutic intervention in order to improve the following deficits and impairments:  Abnormal gait, Decreased activity tolerance, Decreased balance, Decreased cognition, Decreased coordination, Decreased endurance, Decreased knowledge of precautions, Decreased knowledge of use of DME, Decreased mobility, Decreased safety awareness, Decreased strength, Difficulty walking, Impaired perceived functional ability, Improper body mechanics, Postural dysfunction  Visit Diagnosis: History of falling  Muscle weakness (generalized)  Unsteadiness on feet  Other abnormalities of gait and mobility     Problem List There are no active problems to display for this patient.  Harriet Masson, SPT This entire session was performed under direct supervision and direction of a licensed therapist/therapist assistant . I have personally read, edited and approve of the note as written.  Trotter,Margaret PT, DPT 02/15/2018, 11:17 AM  [place] MAIN [facility] SERVICES [address] [place], [place], [address] Phone: [phone]   Fax:  [phone]  Name: Chad Dalesandro [facility] Sr. MRN: [id number] Date of Birth: 1934-04-03

## 2018-02-22 ENCOUNTER — Ambulatory Visit: Payer: PRIVATE HEALTH INSURANCE | Admitting: Physical Therapy

## 2018-02-22 ENCOUNTER — Ambulatory Visit
Admission: RE | Admit: 2018-02-22 | Discharge: 2018-02-22 | Disposition: A | Discharge status: Home / Self Care | Payer: Medicare Other | Source: Ambulatory Visit | Attending: Internal Medicine | Admitting: Internal Medicine

## 2018-02-22 DIAGNOSIS — S060X0A Concussion without loss of consciousness, initial encounter: Secondary | ICD-10-CM | POA: Diagnosis not present

## 2018-02-22 DIAGNOSIS — R269 Unspecified abnormalities of gait and mobility: Secondary | ICD-10-CM | POA: Diagnosis not present

## 2018-02-22 DIAGNOSIS — J329 Chronic sinusitis, unspecified: Secondary | ICD-10-CM | POA: Insufficient documentation

## 2018-02-22 DIAGNOSIS — W19XXXA Unspecified fall, initial encounter: Secondary | ICD-10-CM | POA: Diagnosis not present

## 2018-03-01 ENCOUNTER — Ambulatory Visit: Payer: PRIVATE HEALTH INSURANCE | Admitting: Physical Therapy

## 2019-10-28 IMAGING — CT CT HEAD W/O CM
3 series · 15 of 47 positions shown, 18 images · non-contrast
Comparison: 11/06/2015

CLINICAL DATA: Slurred speech and increased generalized weakness.
History of CVA.

EXAM:
CT HEAD WITHOUT CONTRAST
TECHNIQUE: Contiguous axial images were obtained from the base of the skull
through the vertex without intravenous contrast.

[Series 2: head wo · axial · 0.42mm/px · z∈[+455,+585]mm · 9 of 32 slices shown, 12 images]
[im 3/32  brain]
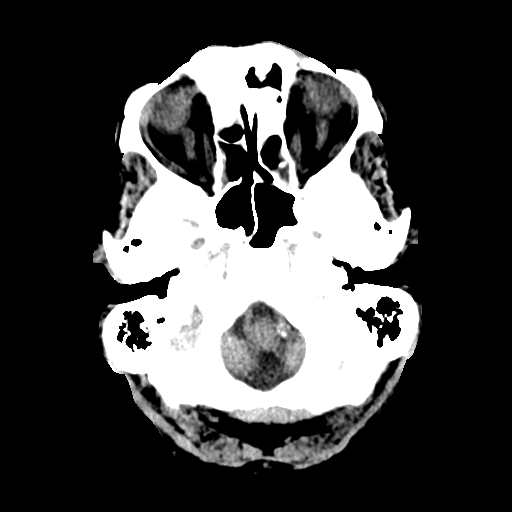
[im 3/32  bone]
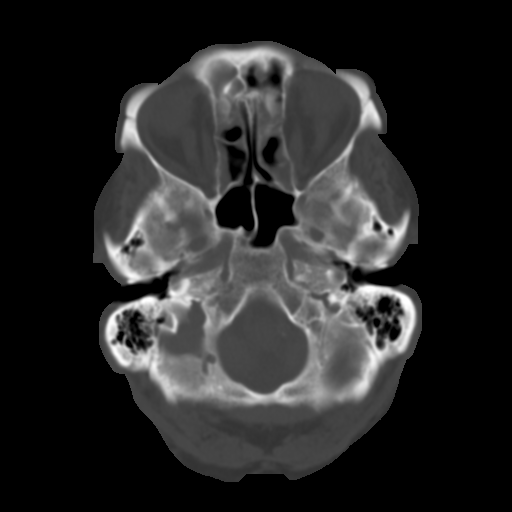
[im 6/32  brain]
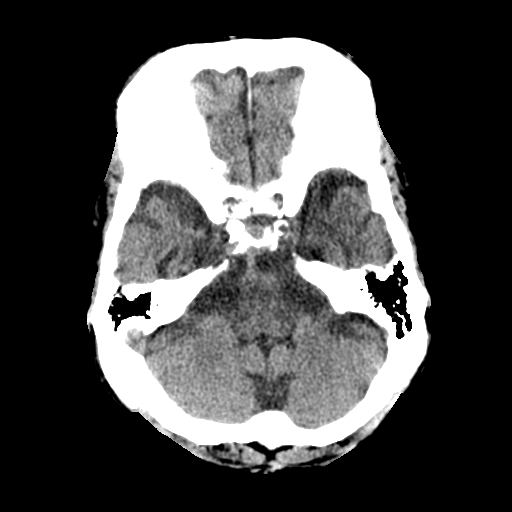
[im 9/32  brain]
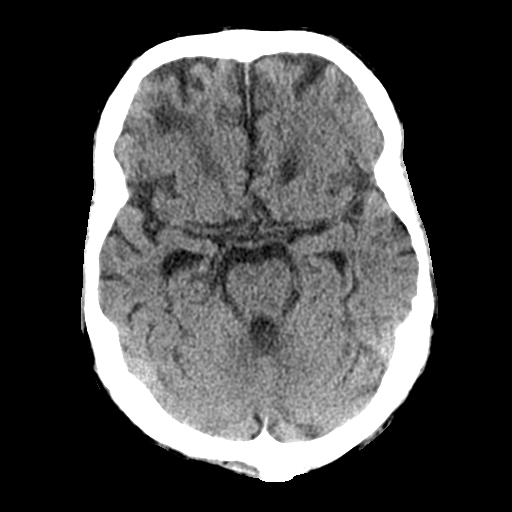
[im 12/32  brain]
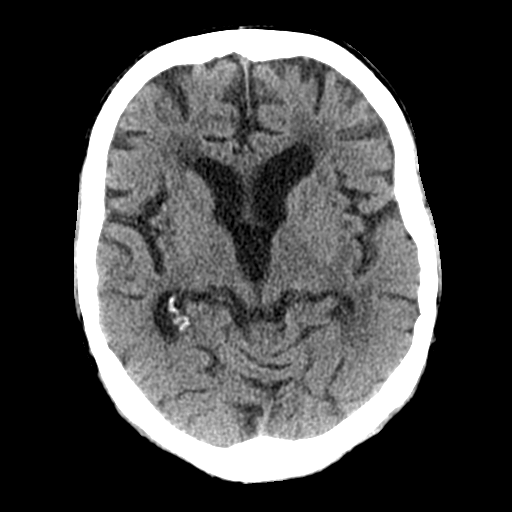
[im 17/32  brain]
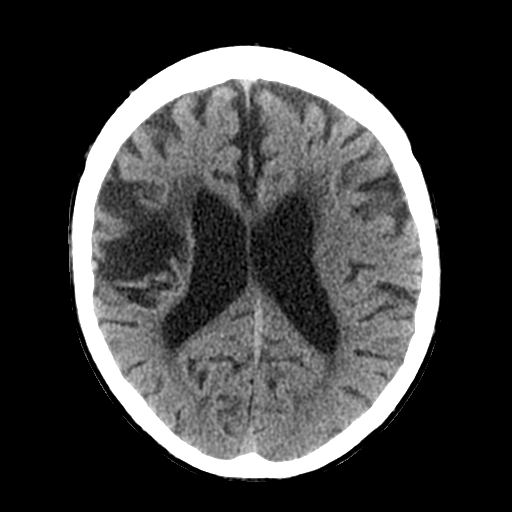
[im 17/32  bone]
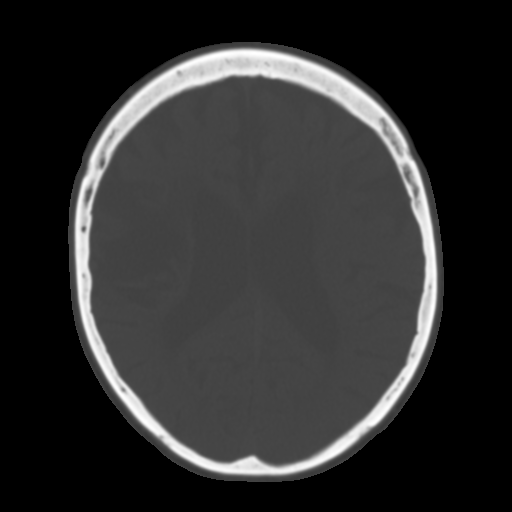
[im 20/32  brain]
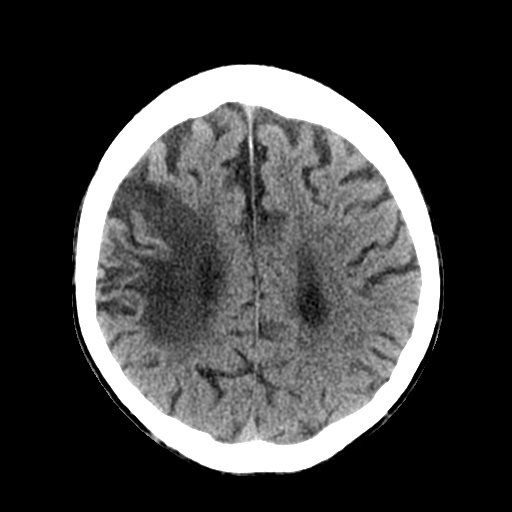
[im 23/32  brain]
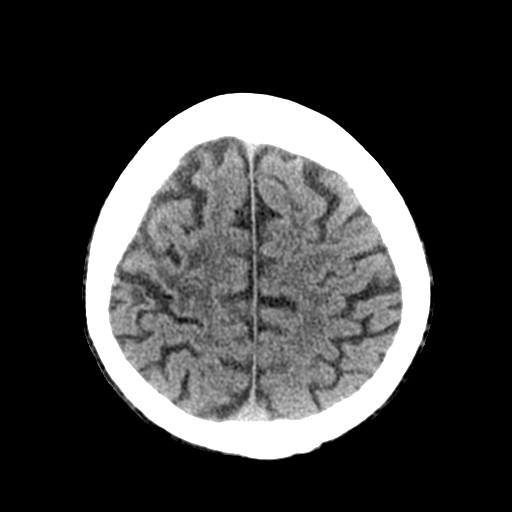
[im 26/32  brain]
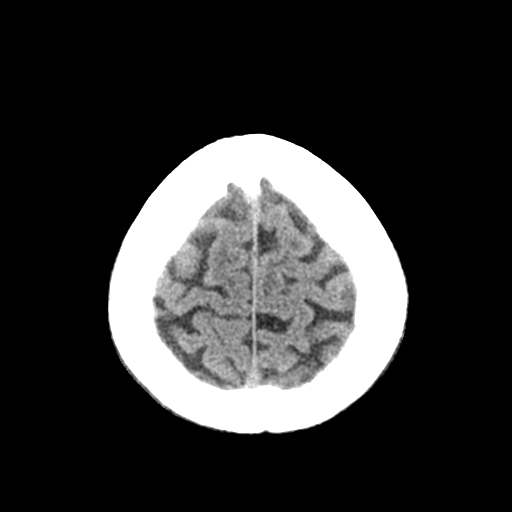
[im 29/32  brain]
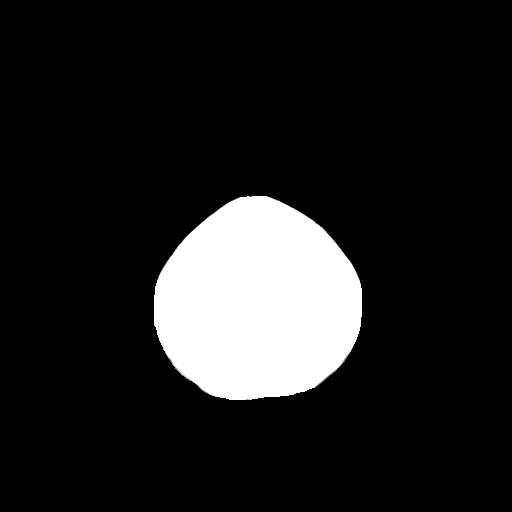
[im 29/32  bone]
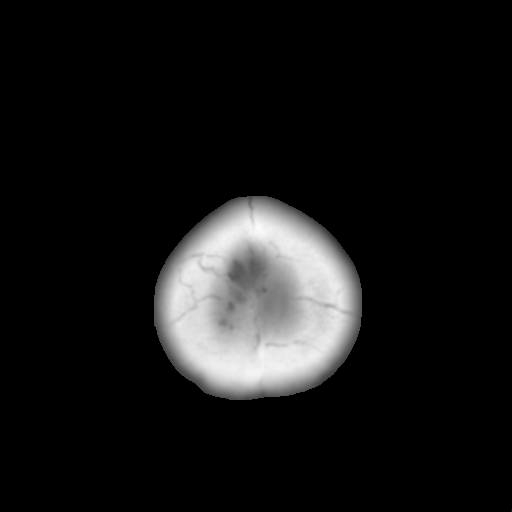

[Series 4: coronal soft tissue · coronal · 0.31mm/px · 3 of 66 slices shown]
[im 22/66  brain]
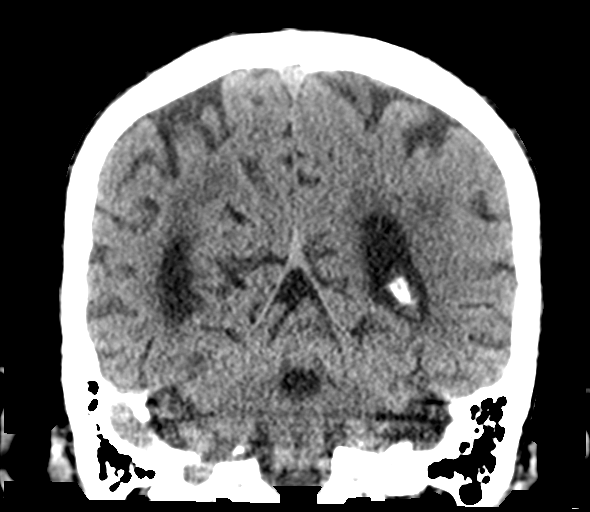
[im 29/66  brain]
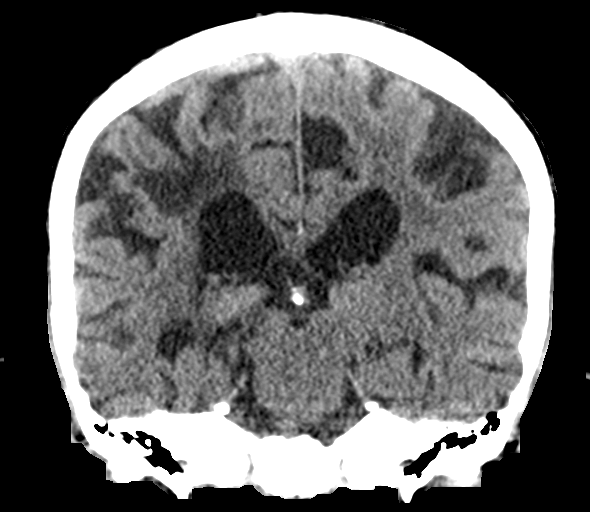
[im 37/66  brain]
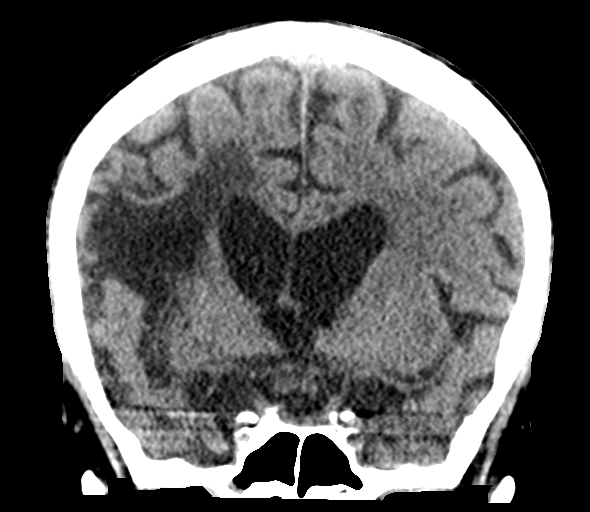

[Series 5: sagittal soft tissue · sagittal · 0.33mm/px · 3 of 54 slices shown]
[im 18/54  brain]
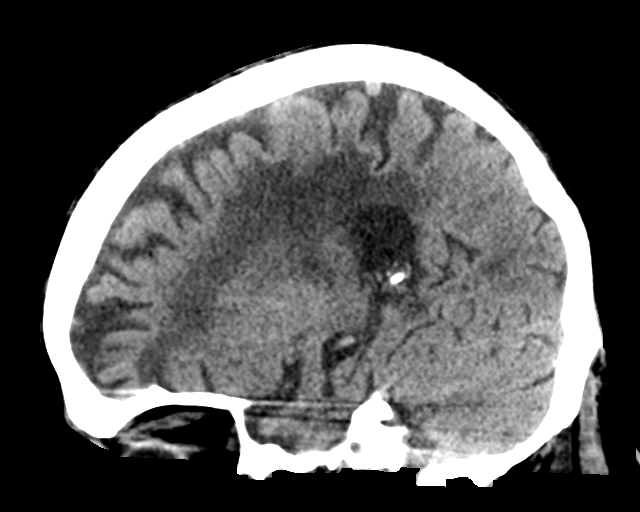
[im 27/54  brain]
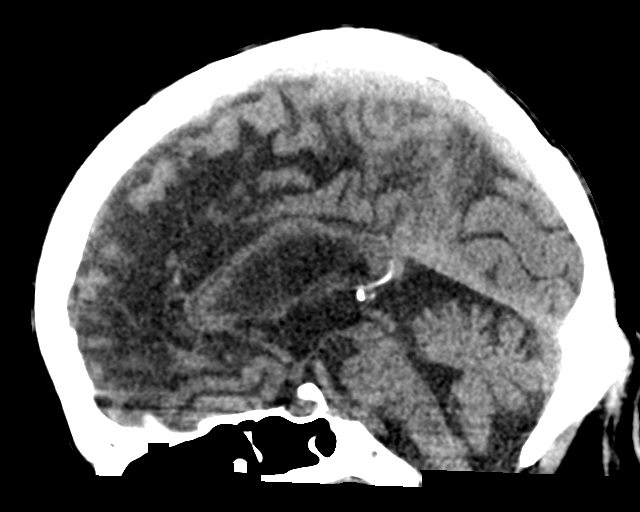
[im 36/54  brain]
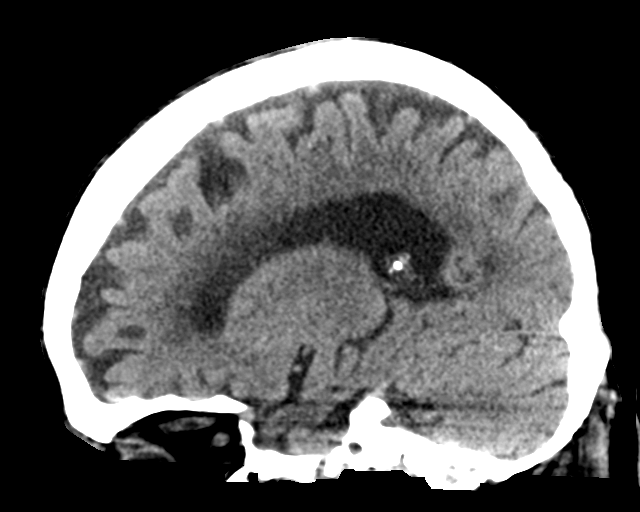

[15 of 47 positions shown; findings below may reference images not displayed]

FINDINGS: Brain: No evidence of acute infarction, hemorrhage, hydrocephalus,
extra-axial collection or mass lesion/mass effect. Stable large area
of encephalomalacia from prior right posterior frontal lobe cortical
and subcortical infarct. Generalized volume loss with ex vacuo
dilatation of the ventricles, stable. Periventricular microvascular
ischemic changes.

Vascular: Calcific atherosclerotic disease of the intra cavernous
carotid arteries, and vertebral arteries.

Skull: Normal. Negative for fracture or focal lesion.

Sinuses/Orbits: Polypoid mucosal thickening of the bilateral ethmoid
and frontal sinuses, and the visualized portion of the maxillary
sinuses.

Other: Bilateral cataract resection.
IMPRESSION: No acute intracranial abnormality.

Stable encephalomalacia from remote right MCA territory infarcts.

Atrophy, chronic microvascular disease.

## 2019-10-31 ENCOUNTER — Encounter: Payer: Self-pay | Admitting: Emergency Medicine

## 2019-10-31 ENCOUNTER — Emergency Department: Payer: Medicare Other

## 2019-10-31 ENCOUNTER — Other Ambulatory Visit: Payer: Self-pay

## 2019-10-31 ENCOUNTER — Emergency Department
Admission: EM | Admit: 2019-10-31 | Discharge: 2019-10-31 | Disposition: A | Discharge status: Home / Self Care | Payer: Medicare Other | Attending: Emergency Medicine | Admitting: Emergency Medicine

## 2019-10-31 DIAGNOSIS — F17200 Nicotine dependence, unspecified, uncomplicated: Secondary | ICD-10-CM | POA: Diagnosis not present

## 2019-10-31 DIAGNOSIS — I951 Orthostatic hypotension: Secondary | ICD-10-CM | POA: Diagnosis not present

## 2019-10-31 DIAGNOSIS — E860 Dehydration: Secondary | ICD-10-CM | POA: Diagnosis not present

## 2019-10-31 DIAGNOSIS — I2510 Atherosclerotic heart disease of native coronary artery without angina pectoris: Secondary | ICD-10-CM | POA: Insufficient documentation

## 2019-10-31 DIAGNOSIS — E119 Type 2 diabetes mellitus without complications: Secondary | ICD-10-CM | POA: Insufficient documentation

## 2019-10-31 DIAGNOSIS — R42 Dizziness and giddiness: Secondary | ICD-10-CM | POA: Diagnosis present

## 2019-10-31 LAB — COMPREHENSIVE METABOLIC PANEL
ALT: 13 U/L (ref 0–44)
AST: 18 U/L (ref 15–41)
Albumin: 4.1 g/dL (ref 3.5–5.0)
Alkaline Phosphatase: 85 U/L (ref 38–126)
Anion gap: 8 (ref 5–15)
BUN: 26 mg/dL — ABNORMAL HIGH (ref 8–23)
CO2: 27 mmol/L (ref 22–32)
Calcium: 8.8 mg/dL — ABNORMAL LOW (ref 8.9–10.3)
Chloride: 105 mmol/L (ref 98–111)
Creatinine, Ser: 1.46 mg/dL — ABNORMAL HIGH (ref 0.61–1.24)
GFR calc Af Amer: 50 mL/min — ABNORMAL LOW (ref >60)
GFR calc non Af Amer: 44 mL/min — ABNORMAL LOW (ref >60)
Glucose, Bld: 182 mg/dL — ABNORMAL HIGH (ref 70–99)
Potassium: 4.3 mmol/L (ref 3.5–5.1)
Sodium: 140 mmol/L (ref 135–145)
Total Bilirubin: 0.9 mg/dL (ref 0.3–1.2)
Total Protein: 6.8 g/dL (ref 6.5–8.1)

## 2019-10-31 LAB — CBC
HCT: 38.5 % — ABNORMAL LOW (ref 39.0–52.0)
Hemoglobin: 13 g/dL (ref 13.0–17.0)
MCH: 31.4 pg (ref 26.0–34.0)
MCHC: 33.8 g/dL (ref 30.0–36.0)
MCV: 93 fL (ref 80.0–100.0)
Platelets: 191 10*3/uL (ref 150–400)
RBC: 4.14 MIL/uL — ABNORMAL LOW (ref 4.22–5.81)
RDW: 12.6 % (ref 11.5–15.5)
WBC: 6.7 10*3/uL (ref 4.0–10.5)
nRBC: 0 % (ref 0.0–0.2)

## 2019-10-31 LAB — GLUCOSE, CAPILLARY: Glucose-Capillary: 186 mg/dL — ABNORMAL HIGH (ref 70–99)

## 2019-10-31 MED ORDER — LACTATED RINGERS IV BOLUS
1000.0000 mL | Freq: Once | INTRAVENOUS | Status: AC
  Administered 2019-10-31: 1000 mL via INTRAVENOUS

## 2019-10-31 NOTE — ED Notes (Signed)
Pt walked around room with x1 assistance. Pt usually uses a cane at home. Pt unsteady at first but once he sits down for while he is able to move around without trouble. Pt reports this is his baseline and he feels safe going home.

## 2019-10-31 NOTE — ED Provider Notes (Signed)
[facility] Emergency Department Provider Note  ____________________________________________   First MD Initiated Contact with Patient 10/31/19 1519     (approximate)  I have reviewed the triage vital signs and the nursing notes.   HISTORY  Chief Complaint Dizziness    HPI Chad Liebman Didion Sr. is a 86 y.o. male  With h/o Dm, CAD, vascular dementia here for evaluation of dizziness upon standing. Pt was at adult daycare today when he apparently voiced that he got lightheaded upon standing. This is a recurrent issue per report. He states he often gets lightheaded when standing but this resolves with sitting or standing still. He does not feel any worse than he normally dose. He denies any headache, CP, SOB, palpitations, or other complaints. Per report from EMS and daycare, he is otherwise well and at his mental and physical baseline. No known recent medical changes.        Past Medical History:  Diagnosis Date  . Coronary artery disease   . Depression   . Diabetes mellitus without complication (HCC)   . Seizures (HCC)   . Stroke (HCC)   . Vascular dementia (HCC)     There are no problems to display for this patient.   Past Surgical History:  Procedure Laterality Date  . CARDIAC SURGERY      Prior to Admission medications   Medication Sig Start Date End Date Taking? Authorizing Provider  atorvastatin (LIPITOR) 40 MG tablet Take 40 mg by mouth daily.    [provider]  donepezil (ARICEPT) 10 MG tablet Take 10 mg by mouth at bedtime.    [provider]  finasteride (PROSCAR) 5 MG tablet Take 5 mg by mouth daily.    [provider]  isosorbide mononitrate (IMDUR) 30 MG 24 hr tablet Take 30 mg by mouth daily.    [provider]  lamoTRIgine (LAMICTAL) 200 MG tablet Take 200 mg by mouth daily.    [provider]  sertraline (ZOLOFT) 50 MG tablet Take 50 mg by mouth daily.    [provider]     Allergies Patient has no known allergies.  History reviewed. No pertinent family history.  Social History Social History   Tobacco Use  . Smoking status: Former Games developer  . Smokeless tobacco: Never Used  Substance Use Topics  . Alcohol use: No  . Drug use: Not on file    Review of Systems  Review of Systems  Constitutional: Negative for chills and fever.  HENT: Negative for sore throat.   Respiratory: Negative for shortness of breath.   Cardiovascular: Negative for chest pain.  Gastrointestinal: Negative for abdominal pain.  Genitourinary: Negative for flank pain.  Musculoskeletal: Negative for neck pain.  Skin: Negative for rash and wound.  Allergic/Immunologic: Negative for immunocompromised state.  Neurological: Positive for light-headedness. Negative for weakness and numbness.  Hematological: Does not bruise/bleed easily.     ____________________________________________  PHYSICAL EXAM:      VITAL SIGNS: ED Triage Vitals  Enc Vitals Group     BP 10/31/19 1302 (!) 96/58     Pulse Rate 10/31/19 1302 69     Resp 10/31/19 1302 18     Temp 10/31/19 1302 98.5 F (36.9 C)     Temp Source 10/31/19 1302 Oral     SpO2 10/31/19 1302 97 %     Weight 10/31/19 1303 185 lb (83.9 kg)     Height 10/31/19 1303 5\' 5"  (1.651 m)     Head  Circumference --      Peak Flow --      Pain Score 10/31/19 1303 0     Pain Loc --      Pain Edu? --      Excl. in GC? --      Physical Exam Vitals and nursing note reviewed.  Constitutional:      General: He is not in acute distress.    Appearance: He is well-developed.  HENT:     Head: Normocephalic and atraumatic.     Mouth/Throat:     Mouth: Mucous membranes are dry.  Eyes:     Conjunctiva/sclera: Conjunctivae normal.  Cardiovascular:     Rate and Rhythm: Normal rate and regular rhythm.     Heart sounds: Normal heart sounds. No murmur heard.  No friction rub.  Pulmonary:     Effort: Pulmonary effort is normal. No  respiratory distress.     Breath sounds: Normal breath sounds. No wheezing or rales.  Abdominal:     General: There is no distension.     Palpations: Abdomen is soft.     Tenderness: There is no abdominal tenderness.  Musculoskeletal:     Cervical back: Neck supple.  Skin:    General: Skin is warm.     Capillary Refill: Capillary refill takes less than 2 seconds.     Findings: No rash.  Neurological:     Mental Status: He is alert.     Motor: No abnormal muscle tone.     Comments: Oriented to person and place but not time, which is baseline. Ambulatory with minimal assistance. CNII-XII intact. Strength 5/5 bl UE and LE. Normal sensation to light touch.       ____________________________________________   LABS (all labs ordered are listed, but only abnormal results are displayed)  Labs Reviewed  CBC - Abnormal; Notable for the following components:      Result Value   RBC 4.14 (*)    HCT 38.5 (*)    All other components within normal limits  COMPREHENSIVE METABOLIC PANEL - Abnormal; Notable for the following components:   Glucose, Bld 182 (*)    BUN 26 (*)    Creatinine, Ser 1.46 (*)    Calcium 8.8 (*)    GFR calc non Af Amer 44 (*)    GFR calc Af Amer 50 (*)    All other components within normal limits  GLUCOSE, CAPILLARY - Abnormal; Notable for the following components:   Glucose-Capillary 186 (*)    All other components within normal limits    ____________________________________________  EKG: Normal sinus rhythm, VR 73. PR 178, QRS 70, QTc 401. Noa cute ST elevations or depressions. Old anterior infarct, no ischemic changes. ________________________________________  RADIOLOGY All imaging, including plain films, CT scans, and ultrasounds, independently reviewed by me, and interpretations confirmed via formal radiology reads.  ED MD interpretation:   CT Head: NAICA  Official radiology report(s): CT Head Wo Contrast  Result Date: 10/31/2019 CLINICAL DATA:   Dizziness EXAM: CT HEAD WITHOUT CONTRAST TECHNIQUE: Contiguous axial images were obtained from the base of the skull through the vertex without intravenous contrast. COMPARISON:  October 2019 FINDINGS: Brain: There is no acute intracranial hemorrhage, mass effect, or edema. There is no new loss of gray-white differentiation. Chronic right MCA territory infarcts again noted with involvement of frontoparietal lobes and insula. Additional patchy and confluent areas of hypoattenuation in the supratentorial white matter nonspecific but probably reflects stable chronic microvascular ischemic changes there is no  extra-axial fluid collection. Prominence of the ventricles and sulci reflects stable generalized parenchymal volume loss. Vascular: There is atherosclerotic calcification at the skull base. Skull: Calvarium is unremarkable. Sinuses/Orbits: Patchy ethmoid and sphenoid sinus mucosal thickening. No significant orbital abnormality. Other: Mastoid air cells are clear. IMPRESSION: No acute intracranial abnormality. Stable chronic findings detailed above. Electronically Signed   By: Guadlupe Spanish M.D.   On: 10/31/2019 13:50    ____________________________________________  PROCEDURES   Procedure(s) performed (including Critical Care):  Procedures  ____________________________________________  INITIAL IMPRESSION / MDM / ASSESSMENT AND PLAN / ED COURSE  As part of my medical decision making, I reviewed the following data within the electronic MEDICAL RECORD NUMBER Nursing notes reviewed and incorporated, Old chart reviewed, Notes from prior ED visits, and [place] Controlled Substance Database       *Chad Norfleet Nulty Sr. was evaluated in Emergency Department on 10/31/2019 for the symptoms described in the history of present illness. He was evaluated in the context of the global COVID-19 pandemic, which necessitated consideration that the patient might be at risk for infection with the SARS-CoV-2 virus that causes  COVID-19. Institutional protocols and algorithms that pertain to the evaluation of patients at risk for COVID-19 are in a state of rapid change based on information released by regulatory bodies including the CDC and federal and state organizations. These policies and algorithms were followed during the patient's care in the ED.  Some ED evaluations and interventions may be delayed as a result of limited staffing during the pandemic.*     Medical Decision Making:  Very pleasant 86 yo M here with lightheadedness upon standing. He is significantly orthostatic on arrival which has resolved with fluids. This is reportedly an ongiong, recurrent issue with him which I suspect is exacerbated 2/2 poor PO intake due to his vascular dementia. Pt given fluids, monitored here with no issues. He otherwise denies any complaints. EKG is nonischemic and tele shows no arrhythmia. Labs show mildly elevated BUN:Cr ratio c/w dehydration but are otherwise reassuring.   Given resolution of orthostasis, will d/c with encouraged fluids at home. Asked that he call his PCP to discuss possible medication changes as some of his meds could be contributing to this. Wife updated.  ____________________________________________  FINAL CLINICAL IMPRESSION(S) / ED DIAGNOSES  Final diagnoses:  Orthostasis  Dehydration     MEDICATIONS GIVEN DURING THIS VISIT:  Medications  lactated ringers bolus 1,000 mL (0 mLs Intravenous Stopped 10/31/19 1730)     ED Discharge Orders    None       Note:  This document was prepared using Dragon voice recognition software and may include unintentional dictation errors.   Shaune Pollack, MD 10/31/19 2329

## 2019-10-31 NOTE — Discharge Instructions (Addendum)
I would recommend discussing your medications with Dr. Philis Kendall tomorrow. Several of your medications can be associated with ORTHOSTASIS, or drop of blood pressure when standing.  Drink at least 8 glasses of water for the next 2-3 days.  Return to the ER as needed.

## 2019-10-31 NOTE — ED Triage Notes (Signed)
Pt went to [facility] for intermittent dizziness and brought to ED.  Pt reports mostly comes when gets up fast.  Feels off balance when dizzy at times. Sx have been for couple weeks per pt. Does have history of cva and DM.  Unlabored.  VSS.  BP soft but last 2 office visits are similar bp.  Oriented at this time in triage.  Color WNL

## 2020-08-05 ENCOUNTER — Ambulatory Visit: Payer: Medicare Other | Attending: Infectious Diseases

## 2020-08-05 ENCOUNTER — Other Ambulatory Visit: Payer: Self-pay

## 2020-08-05 DIAGNOSIS — M6281 Muscle weakness (generalized): Secondary | ICD-10-CM | POA: Insufficient documentation

## 2020-08-05 DIAGNOSIS — R2681 Unsteadiness on feet: Secondary | ICD-10-CM | POA: Insufficient documentation

## 2020-08-05 DIAGNOSIS — R262 Difficulty in walking, not elsewhere classified: Secondary | ICD-10-CM | POA: Diagnosis not present

## 2020-08-05 NOTE — Therapy (Addendum)
[facility] MAIN [facility] SERVICES [address] [place], [place], [address] Phone: [phone]   Fax:  [phone]  Physical Therapy Evaluation  Patient Details  Name: Chad Hiltner Belluomini Sr. MRN: [id number] Date of Birth: March 02, 1934 Referring Provider (PT): Dr. Sampson Goon   Encounter Date: 08/05/2020   PT End of Session - 08/05/20 0927    Visit Number 1    Number of Visits 25    Date for PT Re-Evaluation 10/28/20    Authorization Type Eval: 4/5; 1/10 PN    Authorization - Visit Number 1    Authorization - Number of Visits 10    PT Start Time 0800    PT Stop Time 0904    PT Time Calculation (min) 64 min    Equipment Utilized During Treatment Gait belt    Activity Tolerance Treatment limited secondary to medical complications (Comment);Other (comment)   significant orthostasis limited PT eval   Behavior During Therapy WFL for tasks assessed/performed           Past Medical History:  Diagnosis Date  . Coronary artery disease   . Depression   . Diabetes mellitus without complication (HCC)   . Seizures (HCC)   . Stroke (HCC)   . Vascular dementia [facility])     Past Surgical History:  Procedure Laterality Date  . CARDIAC SURGERY      There were no vitals filed for this visit.    Subjective Assessment - 08/05/20 0804    Subjective Pt with spouse at eval. Reports of > 10 falls in the past 6 months with no specific reason behind them. Decrease in activity levels in the last 2 years due to covid-19 concerns so pt has not been going to adult day care.    Pertinent History Pt is a 87 y.o. male referred to PT for balance and gait due to recurrent falls. He presents with a PMH of: CAD, depression, DM, seizures, CVA, vascular dementia. 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 Pt relies heavily on spouse for history due to baseline cognitive defcits due to his vascular dementia. Hx of orthostatics due to poor liquid intake likely due to his vascular  dementia in the past. Pt with spouse at eval and states his referral is for gait and imbalance. Pt and spouse reports on average he was having 1 fall/week approximately with no specific reason for his falls. Pt relies on chair or bed to stand up from or calls to neighbors to assist in returning pt upright. Pt and spouse repotr pt did have significant fall at the end of january leading to a L shoulder fracture. Unable to specify if any weightbearing precautions. Denies falls at night in low light and a decrease of activity levels since not going to adult daycare due to covid 19 concerns. History of CVA that affected his L side with reported weakness which pt's spouse is curious if this could be affecting his balance. Spouse assists with ADL's such as dressing, bathing, cooking, cleaning. Pt and spouse reports pt spends most of his ambulation at the home inside. Pt's goal is to stop falling and improve his walking.    Limitations Lifting;Standing;Walking    How long can you sit comfortably? N/A    How long can you stand comfortably? a few minutes    How long can you walk comfortably? short household distances    Patient Stated Goals Stop falling, improve walking    Currently in Pain? No/denies  [facility] PT Assessment - 08/05/20 0816      Assessment   Medical Diagnosis Imbalance    Referring Provider (PT) Dr. Sampson GoonFitzgerald    Onset Date/Surgical Date 08/05/20    Hand Dominance Right    Next MD Visit June    Prior Therapy Yes      Precautions   Precautions Fall      Balance Screen   Has the patient fallen in the past 6 months Yes    How many times? 10+    Has the patient had a decrease in activity level because of a fear of falling?  Yes    Is the patient reluctant to leave their home because of a fear of falling?  Yes      Home Environment   Living Environment Private residence    Living Arrangements Spouse/significant other    Available Help at Discharge Family    Type of Home  House    Home Access Stairs to enter    Entrance Stairs-Number of Steps 2    Entrance Stairs-Rails Right    Home Layout One level;Able to live on main level with bedroom/bathroom    Home Equipment Cane - quad;Shower seat      Prior Function   Level of Independence Independent with household mobility with device;Needs assistance with ADLs;Needs assistance with homemaking    Vocation Retired      IT consultantCognition   Overall Cognitive Status History of cognitive impairments - at baseline          Orthostatic assessment:     Vitals prior:   BP: 113/69 mm Hg  HR: 54 BPM  SPO2: 99 %   Vitals in standing:   BP: 86/54 mm Hg   Vitals back in sitting after LE exercises: 118/55 mm Hg  Vitals in standing:   100/44 mm Hg   Vitals after 5xSTS in sitting: 87/57 mm Hg   Pt is asymptomatic throughout assessment.  PAIN:  No  POSTURE: Kyphotic in standing with forward head posture. R lean on RUE on [facility] for support.  Limited hip extension in standing with slight crouched standing. Slumped in sitting.     AROM BUE: RUE grossly WFL.  LUE significantly limited at GHJ. Requires upper trap shrug to elevate and abduct L shoulder. Not objectively measured but visibly and significantly less than 90 degrees in flexion and abduction.    AROM BLE:  Grossly WFL for tasks assessed.    STRENGTH:  Graded on a 0-5 scale Muscle Group Left Right  Shoulder flex /5 /5  Shoulder Abd /5 /5  Shoulder Ext /5 /5  Shoulder IR/ER /5 /5  Elbow /5 /5  Wrist/hand weaker Strong  Hip Flex 4/5 5/5  Hip Abd 5/5 5/5  Hip Add 4+/5 4+/5  Hip Ext NT NT  Hip IR/ER NT NT  Knee Flex 5/5 5/5  Knee Ext 5/5 5/5  Ankle DF 3+/5 5/5  Ankle PF 4/5 5/5   SENSATION:  BLE : Normal to LT in lumbar dermatomes.   NEUROLOGICAL SCREEN: (2+ unless otherwise noted.) N=normal  Ab=abnormal   Level Dermatome R L  C3     C4     C5     C6     C7     C8     T1     L2 Medial thigh/groin N N  L3 Lower thigh/med.knee N N   L4 Medial leg/lat thigh N N  L5 Lat. leg & dorsal foot N N  S1 post/lat foot/thigh/leg N N  S2 Post./med. thigh & leg N N    SOMATOSENSORY:  Any N & T in extremities or weakness: Denies N&T     FUNCTIONAL MOBILITY:  Sit to stand: Relies on RUE support on chair railing to stand. In standing pt relies on [facility] on R hand for maintaining static balance while assessing orthostatics.    BALANCE: Static Sitting Balance  Normal Able to maintain balance against maximal resistance   Good Able to maintain balance against moderate resistance   Good-/Fair+ Accepts minimal resistance   Fair Able to sit unsupported without balance loss and without UE support X  Poor+ Able to maintain with Minimal assistance from individual or chair   Poor Unable to maintain balance-requires mod/max support from individual or chair    Static Standing Balance  Normal Able to maintain standing balance against maximal resistance   Good Able to maintain standing balance against moderate resistance   Good-/Fair+ Able to maintain standing balance against minimal resistance   Fair Able to stand unsupported without UE support and without LOB for 1-2 min   Fair- Requires Min A and UE support to maintain standing without loss of balance X  Poor+ Requires mod A and UE support to maintain standing without loss of balance   Poor Requires max A and UE support to maintain standing balance without loss    Dynamic Sitting Balance  Normal Able to sit unsupported and weight shift across midline maximally   Good Able to sit unsupported and weight shift across midline moderately   Good-/Fair+ Able to sit unsupported and weight shift across midline minimally X  Fair Minimal weight shifting ipsilateral/front, difficulty crossing midline   Fair- Reach to ipsilateral side and unable to weight shift   Poor + Able to sit unsupported with min A and reach to ipsilateral side, unable to weight shift   Poor Able to sit unsupported with  mod A and reach ipsilateral/front-can't cross midline    Standing Dynamic Balance  Normal Stand independently unsupported, able to weight shift and cross midline maximally   Good Stand independently unsupported, able to weight shift and cross midline moderately   Good-/Fair+ Stand independently unsupported, able to weight shift across midline minimally   Fair Stand independently unsupported, weight shift, and reach ipsilaterally, loss of balance when crossing midline   Poor+ Able to stand with Min A and reach ipsilaterally, unable to weight shift   Poor Able to stand with Mod A and minimally reach ipsilaterally, unable to cross midline.      GAIT: Deferred due to orthostasis  OUTCOME MEASURES: TEST Outcome Interpretation  5 times sit<>stand 20.77 sec >60 yo, >15 sec indicates increased risk for falls  10 meter walk test        Deferred <1.0 m/s indicates increased risk for falls; limited community ambulator  Nurse, learning disability        Deferred <36/56 (100% risk for falls), 37-45 (80% risk for falls); 46-51 (>50% risk for falls); 52-55 (lower risk <25% of falls)  FOTO 59/62             Discussion with Dr. Sampson Goon in [place] on Epic. Per Dr. Sampson Goon, he will contact pt and refer to cardiology for follow up for orthostasis during evaluation today. PT still waiting to hear from Van Clines, PA on any possible weight bearing precautions for L humeral fracture that occurred in late January 2022. PT believes pt could benefit from use of RW for safety, but  if there are weightbearing precautions this may interfere pt from being able to utilize RW for improved stability and reduced risk of falls at this time.     * PT called pt's spouse at 11:15 a.m. PT educated spouse on chat with Dr. Sampson Goon. Plan to hold off on PT appointments at this time until pt is able to follow-up with cardiology for orthostasis. Pt's spouse verbalized understanding and will continue PT's POC once cardiology work up  is complete. Once vitals are stabilized, pt needs to perform Berg balance and 10 m gait speed for long term goals due to them being deferred today for abnormal vitals.*  Objective measurements completed on examination: See above findings.     PT Education - 08/05/20 0926    Education Details Reaching out to Dr. Sampson Goon on orthostatic vitals recorded during PT eval.    Person(s) Educated Patient;Spouse    Methods Explanation    Comprehension Verbalized understanding            PT Short Term Goals - 08/05/20 1003      PT SHORT TERM GOAL #1   Title Pt will be independent with HEP to improve strength/mobility to improve independence with ADL's    Baseline 08/05/2020: Seated marches, long arc quads    Time 6    Period Weeks    Status New    Target Date 09/16/20             PT Long Term Goals - 08/05/20 1026      PT LONG TERM GOAL #1   Title Pt will improve 5xSTS to < 15 sec for clinically significant increase in LE strength and decreased risk of falls.    Baseline 4/5: 20.77 sec with RUE support on chair rail    Time 12    Period Weeks    Status New    Target Date 10/28/20      PT LONG TERM GOAL #2   Title Pt will improve 10 m gait speed to > 0.8 m/s with LRAD to improve safety with household ambulation and decrease risk of falls.    Baseline 4/5: Deferred due to orthostasis    Time 12    Period Weeks    Status New    Target Date 10/28/20      PT LONG TERM GOAL #3   Title Pt will improve Berg balance by 8 points as evidenced for age norms to display clinically significant reduced risk of falls for improved independence with ADL completion and walking tasks.    Baseline 4/5: deferred due to orthostasis    Time 12    Period Weeks    Status New    Target Date 10/28/20      PT LONG TERM GOAL #4   Title Pt will improve FOTO to target score equal or greater than 62 to maximize capcacity to improve independence with mobility    Baseline 4/5: 59/62    Time 12    Period  Weeks    Status New    Target Date 10/28/20                  Plan - 08/05/20 0930    Clinical Impression Statement Pt is a pleasant 87 y.o. male referred to PT for gait abnormality, imbalance, and history of falls. He is accompanied by his spouse who he relies on heavily for history taking due to baseline cognitive deficits form vascular dementia. He is HOH and is able to hear  better on R ear > L ear. Pt presents with normal and symmetrical sensation to LT in BLE's and ROM is grossly WFL in LE's. RUE grossly WFL with shoulder flexion, ER, Abd with significant limitations in L shoulder from previous reports of humeral fracture in late january 2022 with heavy reliance on Upper trap to shrug LUE into flexion and abduction. Able to visibly and grossly estimate his elevation to be less than 90 deg without objective measurements taken. Pt is R handed however and is unable to state if he has weight bearing precautions. R grip strength > L grip strength with finger squeeze from chronic L sided weakness from previous CVA. Pt limited in further examination due to significant orthstatics with standing. At baseline pt's BP recorded at 113/69 mm Hg. With standing, BP dropped to 86/ 54 mm Hg. Pt returned to sitting and performed LE ROM exercises in attempts to elevate BP with a recording of 100/44 mm back in standinf Hg after exercises in sitting. Only able to confidently complete 5xSTS with reassessment post test with BP reading at 87/ 54 mm Hg after testing in a seated position. Pt however remained asymptomatic throughout orthostatic assessment. Pt scored a 20.77 sec demonstrating functional LE weakness and placing pt at increased risk of falls. Rest of eval deferred for safety. Pt and spouse educated on contacting physician on orthstatic vital signs although it is a chronic, condition pt has at baseline. Educated that pt is at increased risk of falls with significant orthostasis and PT requests MD clearance for  safe continuation of PT. Pt and spouse verbalizing understanding. PT plan to contact physician on clearance for continuation of PT if MD believes vitals are safe for participation. Also PT plan to contact physician on L humerus weightbearing precautions due to belief pt can benefit from use of RW for increased stability due to history of > 10 falls in the last 6 months. Pt requires multimodal cuing, use of RUE, and increased time for STS transfers with kyphotic posture and reliance of SBQC in RUE. Pt can continue to benefit from further skilled Pt treatment to treat strength and balance deficits to reduce risk of falls and caregiver burden once vitals are stabilized.    Personal Factors and Comorbidities Age;Comorbidity 3+;Time since onset of injury/illness/exacerbation;Past/Current Experience    Examination-Activity Limitations Bathing;Bed Mobility;Squat;Stairs;Bend;Locomotion Level;Stand;Caring for Others;Reach Overhead;Transfers;Dressing    Examination-Participation Restrictions Church;Cleaning;Shop;Community Activity;Driving;Yard Work    Conservation officer, historic buildings Unstable/Unpredictable    Visual merchandiser    Rehab Potential Fair    PT Frequency 2x / week    PT Duration 12 weeks    PT Treatment/Interventions ADLs/Self Care Home Management;DME Instruction;Gait training;Stair training;Functional mobility training;Therapeutic activities;Therapeutic exercise;Balance training;Neuromuscular re-education;Patient/family education;Manual techniques;Passive range of motion;Energy conservation    PT Next Visit Plan Reassess orthostatics. Complete 10 m gait speed, Berg.    PT Home Exercise Plan Educated pt and spouse on seated ROM exerices to assist elevating BP.    Consulted and Agree with Plan of Care Patient;Family member/caregiver    Family Member Consulted spouse           Patient will benefit from skilled therapeutic intervention in order to improve the following deficits and  impairments:  Abnormal gait,Improper body mechanics,Decreased mobility,Postural dysfunction,Decreased activity tolerance,Decreased endurance,Decreased range of motion,Decreased strength,Decreased balance,Decreased safety awareness,Difficulty walking  Visit Diagnosis: Difficulty in walking, not elsewhere classified  Unsteadiness on feet  Muscle weakness (generalized)     Problem List There are no problems to display for this  patient.   Delphia Grates. Fairly IV, PT, DPT Physical Therapist- [facility]  08/05/2020, 10:50 AM  [facility] MAIN [facility] SERVICES [address] [place], [place], [address] Phone: [phone]   Fax:  [phone]  Name: Chad Mcmiller Delcastillo Sr. MRN: [id number] Date of Birth: 04/03/1934

## 2020-08-07 ENCOUNTER — Ambulatory Visit: Payer: Medicare Other | Admitting: Physical Therapy

## 2020-08-19 ENCOUNTER — Ambulatory Visit: Payer: PRIVATE HEALTH INSURANCE

## 2020-08-21 ENCOUNTER — Ambulatory Visit: Payer: PRIVATE HEALTH INSURANCE | Admitting: Physical Therapy

## 2020-08-26 ENCOUNTER — Ambulatory Visit: Payer: Medicare Other | Admitting: Physical Therapy

## 2020-09-02 ENCOUNTER — Ambulatory Visit: Payer: Medicare Other | Attending: Infectious Diseases

## 2020-09-02 ENCOUNTER — Other Ambulatory Visit: Payer: Self-pay

## 2020-09-02 DIAGNOSIS — R278 Other lack of coordination: Secondary | ICD-10-CM | POA: Diagnosis present

## 2020-09-02 DIAGNOSIS — R2689 Other abnormalities of gait and mobility: Secondary | ICD-10-CM | POA: Diagnosis present

## 2020-09-02 DIAGNOSIS — M6281 Muscle weakness (generalized): Secondary | ICD-10-CM | POA: Diagnosis present

## 2020-09-02 DIAGNOSIS — R2681 Unsteadiness on feet: Secondary | ICD-10-CM | POA: Diagnosis not present

## 2020-09-02 DIAGNOSIS — R269 Unspecified abnormalities of gait and mobility: Secondary | ICD-10-CM | POA: Diagnosis present

## 2020-09-02 DIAGNOSIS — R262 Difficulty in walking, not elsewhere classified: Secondary | ICD-10-CM | POA: Insufficient documentation

## 2020-09-02 NOTE — Therapy (Signed)
[facility] MAIN [facility] SERVICES [address] [place], [place], [address] Phone: [phone]   Fax:  [phone]  Physical Therapy Treatment  Patient Details  Name: Chad Mcclenton Wirtanen Sr. MRN: [id number] Date of Birth: 09-Aug-1933 Referring Provider (PT): Dr. Sampson Goon   Encounter Date: 09/02/2020   PT End of Session - 09/02/20 1622    Visit Number 2    Number of Visits 25    Date for PT Re-Evaluation 10/28/20    Authorization Type Eval: 4/5; 1/10 PN    Authorization - Visit Number 1    Authorization - Number of Visits 10    PT Start Time 0944    PT Stop Time 1029    PT Time Calculation (min) 45 min    Equipment Utilized During Treatment Gait belt    Activity Tolerance Patient tolerated treatment well    Behavior During Therapy WFL for tasks assessed/performed           Past Medical History:  Diagnosis Date  . Coronary artery disease   . Depression   . Diabetes mellitus without complication (HCC)   . Seizures (HCC)   . Stroke (HCC)   . Vascular dementia [facility])     Past Surgical History:  Procedure Laterality Date  . CARDIAC SURGERY      There were no vitals filed for this visit.   Subjective Assessment - 09/02/20 1620    Subjective Pt spouse present at treatment. Pt spouse reports pt fell yesterday in bathroom. Spouse report they saw pt's PA after fall and determined there was no injury. Both pt and spouse are unsure why pt fell. Pt reports he has no pain in L arm and that he has been using it somewhat. They report they have not been told pt has any precautions with L UE.    Pertinent History Pt is a 87 y.o. male referred to PT for balance and gait due to recurrent falls. He presents with a PMH of: CAD, depression, DM, seizures, CVA, vascular dementia. 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 Pt relies heavily on spouse for history due to baseline cognitive defcits due to his vascular dementia. Hx of orthostatics due to poor liquid  intake likely due to his vascular dementia in the past. Pt with spouse at eval and states his referral is for gait and imbalance. Pt and spouse reports on average he was having 1 fall/week approximately with no specific reason for his falls. Pt relies on chair or bed to stand up from or calls to neighbors to assist in returning pt upright. Pt and spouse repotr pt did have significant fall at the end of january leading to a L shoulder fracture. Unable to specify if any weightbearing precautions. Denies falls at night in low light and a decrease of activity levels since not going to adult daycare due to covid 19 concerns. History of CVA that affected his L side with reported weakness which pt's spouse is curious if this could be affecting his balance. Spouse assists with ADL's such as dressing, bathing, cooking, cleaning. Pt and spouse reports pt spends most of his ambulation at the home inside. Pt's goal is to stop falling and improve his walking.    Limitations Lifting;Standing;Walking    How long can you sit comfortably? N/A    How long can you stand comfortably? a few minutes    How long can you walk comfortably? short household distances    Patient Stated  Goals Stop falling, improve walking    Currently in Pain? No/denies          TREATMENT  VITALS: Seated RUE 98/53 HR 59; pt report no sx  Standing R UE: 96/56 HR 62; pt reports no sx.  BERG: 29/56  Reassessment of MMT: BLEs grossly 4/5. Pt does exhibit decreased hip adduction to 4/5 B  Seated marches 1x20   Instruction in HEP:   Access Code: [email or web address] URL: [email or web address] Date: 09/02/2020 Prepared by: Temple Pacini  Exercises  Seated March - 1 x daily - 4 x weekly - 3 sets - 10 reps Seated Hip Adduction Isometrics with Ball - 1 x daily - 4 x weekly - 3 sets - 10 reps     [facility] PT Assessment - 09/02/20 0001      Standardized Balance Assessment   Standardized Balance Assessment Berg Balance Test      Berg  Balance Test   Sit to Stand Able to stand  independently using hands    Standing Unsupported Able to stand 2 minutes with supervision    Sitting with Back Unsupported but Feet Supported on Floor or Stool Able to sit safely and securely 2 minutes    Stand to Sit Controls descent by using hands    Transfers Needs one person to assist    Standing Unsupported with Eyes Closed Able to stand 10 seconds with supervision    Standing Unsupported with Feet Together Able to place feet together independently and stand for 1 minute with supervision    From Standing, Reach Forward with Outstretched Arm Can reach forward >12 cm safely (5")    From Standing Position, Pick up Object from Floor Unable to pick up shoe, but reaches 2-5 cm (1-2") from shoe and balances independently    From Standing Position, Turn to Look Behind Over each Shoulder Turn sideways only but maintains balance    Turn 360 Degrees Needs close supervision or verbal cueing    Standing Unsupported, Alternately Place Feet on Step/Stool Needs assistance to keep from falling or unable to try    Standing Unsupported, One Foot in Front Needs help to step but can hold 15 seconds    Standing on One Leg Unable to try or needs assist to prevent fall    Total Score 29             PT Education - 09/02/20 1621    Education Details Pt and spouse educated on HEP (see note for details)    Person(s) Educated Patient    Methods Explanation;Verbal cues;Demonstration;Tactile cues;Handout    Comprehension Verbalized understanding;Returned demonstration            PT Short Term Goals - 08/05/20 1003      PT SHORT TERM GOAL #1   Title Pt will be independent with HEP to improve strength/mobility to improve independence with ADL's    Baseline 08/05/2020: Seated marches, long arc quads    Time 6    Period Weeks    Status New    Target Date 09/16/20             PT Long Term Goals - 08/05/20 1026      PT LONG TERM GOAL #1   Title Pt will  improve 5xSTS to < 15 sec for clinically significant increase in LE strength and decreased risk of falls.    Baseline 4/5: 20.77 sec with RUE support on chair rail    Time 12    Period Weeks  Status New    Target Date 10/28/20      PT LONG TERM GOAL #2   Title Pt will improve 10 m gait speed to > 0.8 m/s with LRAD to improve safety with household ambulation and decrease risk of falls.    Baseline 4/5: Deferred due to orthostasis    Time 12    Period Weeks    Status New    Target Date 10/28/20      PT LONG TERM GOAL #3   Title Pt will improve Berg balance by 8 points as evidenced for age norms to display clinically significant reduced risk of falls for improved independence with ADL completion and walking tasks.    Baseline 4/5: deferred due to orthostasis    Time 12    Period Weeks    Status New    Target Date 10/28/20      PT LONG TERM GOAL #4   Title Pt will improve FOTO to target score equal or greater than 62 to maximize capcacity to improve independence with mobility    Baseline 4/5: 59/62    Time 12    Period Weeks    Status New    Target Date 10/28/20                 Plan - 09/02/20 1623    Clinical Impression Statement Continued assessment of pt BP and balance performed this session as pt has not been to PT in approx. a month. Pt Berg score (29/56) indicates pt is a fall risk. Pt BP remains low throughout session (seated 98/53, HR 59, and standing 96/56, HR 62), but pt reports no sx. Pt and spouse report BP being monitored by pt doctor/doctor is aware. PT also reassessed pt's MMT where BLEs are grossly 4/5. PT provided pt with HEP (all seated exercises) and instructed pt and spouse in HEP where they verbalized understanding. Pt will benefit from further skilled PT to improve balance and BLE strength in order to increase ease and safety with transfers and ADLs.    Personal Factors and Comorbidities Age;Comorbidity 3+;Time since onset of  injury/illness/exacerbation;Past/Current Experience    Examination-Activity Limitations Bathing;Bed Mobility;Squat;Stairs;Bend;Locomotion Level;Stand;Caring for Others;Reach Overhead;Transfers;Dressing    Examination-Participation Restrictions Church;Cleaning;Shop;Community Activity;Driving;Yard Work    Stability/Clinical Decision Making Unstable/Unpredictable    Rehab Potential Fair    PT Frequency 2x / week    PT Duration 12 weeks    PT Treatment/Interventions ADLs/Self Care Home Management;DME Instruction;Gait training;Stair training;Functional mobility training;Therapeutic activities;Therapeutic exercise;Balance training;Neuromuscular re-education;Patient/family education;Manual techniques;Passive range of motion;Energy conservation    PT Next Visit Plan Reassess orthostatics. Complete 10 m gait speed, Berg., if pt able    PT Home Exercise Plan Educated pt and spouse on seated ROM exerices to assist elevating BP.    Consulted and Agree with Plan of Care Patient;Family member/caregiver    Family Member Consulted spouse           Patient will benefit from skilled therapeutic intervention in order to improve the following deficits and impairments:  Abnormal gait,Improper body mechanics,Decreased mobility,Postural dysfunction,Decreased activity tolerance,Decreased endurance,Decreased range of motion,Decreased strength,Decreased balance,Decreased safety awareness,Difficulty walking  Visit Diagnosis: Unsteadiness on feet  Muscle weakness (generalized)  Other abnormalities of gait and mobility     Problem List There are no problems to display for this patient.  Temple Pacini PT, DPT 09/02/2020, 4:30 PM  [facility] MAIN [facility] SERVICES [address] [place], [place], [address] Phone: [phone]   Fax:  [phone]  Name: Chad Patteelva J Bratz Sr. MRN: [id number] Date of Birth: February 23, 1934

## 2020-09-04 ENCOUNTER — Other Ambulatory Visit: Payer: Self-pay

## 2020-09-04 ENCOUNTER — Encounter: Payer: Self-pay | Admitting: Physical Therapy

## 2020-09-04 ENCOUNTER — Ambulatory Visit: Payer: Medicare Other | Admitting: Physical Therapy

## 2020-09-04 VITALS — BP 99/45 | HR 52

## 2020-09-04 DIAGNOSIS — R2681 Unsteadiness on feet: Secondary | ICD-10-CM | POA: Diagnosis not present

## 2020-09-04 DIAGNOSIS — M6281 Muscle weakness (generalized): Secondary | ICD-10-CM

## 2020-09-04 DIAGNOSIS — R262 Difficulty in walking, not elsewhere classified: Secondary | ICD-10-CM

## 2020-09-04 DIAGNOSIS — R2689 Other abnormalities of gait and mobility: Secondary | ICD-10-CM

## 2020-09-04 NOTE — Therapy (Signed)
[facility] MAIN [facility] SERVICES [address] [place], [place], [address] Phone: [phone]   Fax:  [phone]  Physical Therapy Treatment  Patient Details  Name: Chad Richens Stiver Sr. MRN: [id number] Date of Birth: 1933/10/30 Referring Provider (PT): Dr. Sampson Goon   Encounter Date: 09/04/2020   PT End of Session - 09/04/20 0847    Visit Number 3    Number of Visits 25    Date for PT Re-Evaluation 10/28/20    Authorization Type Eval: 4/5; 1/10 PN    Authorization - Visit Number 3    Authorization - Number of Visits 10    PT Start Time [phone]    PT Stop Time 0930    PT Time Calculation (min) 47 min    Equipment Utilized During Treatment Gait belt    Activity Tolerance Patient tolerated treatment well    Behavior During Therapy WFL for tasks assessed/performed           Past Medical History:  Diagnosis Date  . Coronary artery disease   . Depression   . Diabetes mellitus without complication (HCC)   . Seizures (HCC)   . Stroke (HCC)   . Vascular dementia [facility])     Past Surgical History:  Procedure Laterality Date  . CARDIAC SURGERY      Vitals:   09/04/20 0846  BP: (!) 99/45  Pulse: (!) 52     Subjective Assessment - 09/04/20 0845    Subjective Pt spouse present at treatment. Spouse reports no new changes since last session; She reports he has been at daycare and has an appointment at [facility] clinic later this morning;    Pertinent History Pt is a 87 y.o. male referred to PT for balance and gait due to recurrent falls. He presents with a PMH of: CAD, depression, DM, seizures, CVA, vascular dementia. 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 Pt relies heavily on spouse for history due to baseline cognitive defcits due to his vascular dementia. Hx of orthostatics due to poor liquid intake likely due to his vascular dementia in the past. Pt with spouse at eval and states his referral is for gait and imbalance. Pt and spouse reports  on average he was having 1 fall/week approximately with no specific reason for his falls. Pt relies on chair or bed to stand up from or calls to neighbors to assist in returning pt upright. Pt and spouse repotr pt did have significant fall at the end of january leading to a L shoulder fracture. Unable to specify if any weightbearing precautions. Denies falls at night in low light and a decrease of activity levels since not going to adult daycare due to covid 19 concerns. History of CVA that affected his L side with reported weakness which pt's spouse is curious if this could be affecting his balance. Spouse assists with ADL's such as dressing, bathing, cooking, cleaning. Pt and spouse reports pt spends most of his ambulation at the home inside. Pt's goal is to stop falling and improve his walking.    Limitations Lifting;Standing;Walking    How long can you sit comfortably? N/A    How long can you stand comfortably? a few minutes    How long can you walk comfortably? short household distances    Patient Stated Goals Stop falling, improve walking    Currently in Pain? No/denies    Multiple Pain Sites No  TREATMENT: Vitals assessed, see above- patient denies any symptoms of dizziness; Spouse reports he took all meds and has eaten breakfast;  Instructed patient in LE strengthening exercise: Standing in parallel bars: -hip flexion march x15 reps x2 sets with 1 rail assist with cues for erect posture and increased ROM to tolerance; -BLE heel raises x15 reps with 2 HHA and cues for knee extension for better calf strengthening; -Standing side/side weight shift with 2-0 rail assist and cues for erect posture to facilitate better LE weight bearing and stance control; -mini squat x10 reps with mod VCs for proper positioning for better quad strengthening and motor control;   PT reassessed vitals: BP: 106/53, HR: 50  Forward step ups on 6 inch step with 1 rail assist x10 reps; Patient required  min A for safety with increased lateral loss of balance;   Standing right foot on step, left foot on firm surface:   Standing with rail assist x15 sec with cues for erect posture  Standing without rail assist, unsupported to facilitate increased weight bearing in LLE (weaker leg), min A-mod A and cues for erect posture and better weight shift;   Side stepping in parallel bars with 1-0 HHA x3 laps each direction with min A for safety and cues for weight shift for better stance control; Also required cues to slow down steps for balance recovery;   Gait in gym:  Patient instructed in gait with SBQC x50 feet; Patient ambulates carrying cane despite cues to utilize cane for balance safety; He requires min A for safety with occasional lateral loss of balance and cues for direction and safety awareness.   While walking, spouse reports that she got patient a RW yesterday and took it to the adult day care for him. PT had patient walk with a RW with reciprocal gait pattern noted with less lateral loss of balance. He does require CGA for safety.  Recommend caregiver/patient bring in RW for proper adjustment and for additional gait training for safety;  Patient would benefit from additional gait training with RW to improve awareness and assist patient with remembering to use it.    Patient tolerated session well. He exhibits better BP with standing activities. Patient does exhibit increased lateral loss of balance and forward weight shift with standing unsupported requiring cues for erect posture and neutral weight shift. He requires rail assist and/or min A with most standing activities for safety;   He denies any pain at end of session. He does report increased fatigue at end of session.                        PT Education - 09/04/20 0847    Education Details LE strengthening, balance, HEP    Person(s) Educated Patient    Methods Explanation;Verbal cues    Comprehension Verbalized  understanding;Returned demonstration;Verbal cues required;Need further instruction            PT Short Term Goals - 08/05/20 1003      PT SHORT TERM GOAL #1   Title Pt will be independent with HEP to improve strength/mobility to improve independence with ADL's    Baseline 08/05/2020: Seated marches, long arc quads    Time 6    Period Weeks    Status New    Target Date 09/16/20             PT Long Term Goals - 08/05/20 1026      PT LONG TERM GOAL #1  Title Pt will improve 5xSTS to < 15 sec for clinically significant increase in LE strength and decreased risk of falls.    Baseline 4/5: 20.77 sec with RUE support on chair rail    Time 12    Period Weeks    Status New    Target Date 10/28/20      PT LONG TERM GOAL #2   Title Pt will improve 10 m gait speed to > 0.8 m/s with LRAD to improve safety with household ambulation and decrease risk of falls.    Baseline 4/5: Deferred due to orthostasis    Time 12    Period Weeks    Status New    Target Date 10/28/20      PT LONG TERM GOAL #3   Title Pt will improve Berg balance by 8 points as evidenced for age norms to display clinically significant reduced risk of falls for improved independence with ADL completion and walking tasks.    Baseline 4/5: deferred due to orthostasis    Time 12    Period Weeks    Status New    Target Date 10/28/20      PT LONG TERM GOAL #4   Title Pt will improve FOTO to target score equal or greater than 62 to maximize capcacity to improve independence with mobility    Baseline 4/5: 59/62    Time 12    Period Weeks    Status New    Target Date 10/28/20                 Plan - 09/04/20 1142    Clinical Impression Statement Patient presents with lower BP however with standing activity exhibits improvement in BP levels. He tolerated session well. Patient was instructed in advanced LE strengthening/ROM exercise to facilitate better mobility. He requires 1 rail assist with most standing  tasks. Patient often loses balance laterally requiring min- mod A for neutral weight shift. Patient also requires min-mod VCs for erect posture and weight shift for better stance control. Patient's spouse reports that they just got him a RW. He would benefit from additional skilled PT Intervention to improve strength, balance and gait safety with education on safe walker use.    Personal Factors and Comorbidities Age;Comorbidity 3+;Time since onset of injury/illness/exacerbation;Past/Current Experience    Examination-Activity Limitations Bathing;Bed Mobility;Squat;Stairs;Bend;Locomotion Level;Stand;Caring for Others;Reach Overhead;Transfers;Dressing    Examination-Participation Restrictions Church;Cleaning;Shop;Community Activity;Driving;Yard Work    Stability/Clinical Decision Making Unstable/Unpredictable    Rehab Potential Fair    PT Frequency 2x / week    PT Duration 12 weeks    PT Treatment/Interventions ADLs/Self Care Home Management;DME Instruction;Gait training;Stair training;Functional mobility training;Therapeutic activities;Therapeutic exercise;Balance training;Neuromuscular re-education;Patient/family education;Manual techniques;Passive range of motion;Energy conservation    PT Next Visit Plan Reassess orthostatics. Complete 10 m gait speed, Berg., 10mwt if pt able    PT Home Exercise Plan Educated pt and spouse on seated ROM exerices to assist elevating BP.    Consulted and Agree with Plan of Care Patient;Family member/caregiver    Family Member Consulted spouse           Patient will benefit from skilled therapeutic intervention in order to improve the following deficits and impairments:  Abnormal gait,Improper body mechanics,Decreased mobility,Postural dysfunction,Decreased activity tolerance,Decreased endurance,Decreased range of motion,Decreased strength,Decreased balance,Decreased safety awareness,Difficulty walking  Visit Diagnosis: Muscle weakness (generalized)  Other  abnormalities of gait and mobility  Unsteadiness on feet  Difficulty in walking, not elsewhere classified     Problem List There are no  problems to display for this patient.   Breandan People PT, DPT 09/04/2020, 12:03 PM  [place] [facility] MAIN [facility] SERVICES [address] [place], [place], [address] Phone: [phone]   Fax:  [phone]  Name: Chad Bramer [facility] Sr. MRN: [id number] Date of Birth: 14-Dec-1933

## 2020-09-09 ENCOUNTER — Ambulatory Visit: Payer: Medicare Other | Admitting: Physical Therapy

## 2020-09-09 ENCOUNTER — Other Ambulatory Visit: Payer: Self-pay

## 2020-09-09 ENCOUNTER — Encounter: Payer: Self-pay | Admitting: Physical Therapy

## 2020-09-09 VITALS — BP 95/50 | HR 55

## 2020-09-09 DIAGNOSIS — R2689 Other abnormalities of gait and mobility: Secondary | ICD-10-CM

## 2020-09-09 DIAGNOSIS — R262 Difficulty in walking, not elsewhere classified: Secondary | ICD-10-CM

## 2020-09-09 DIAGNOSIS — R2681 Unsteadiness on feet: Secondary | ICD-10-CM

## 2020-09-09 DIAGNOSIS — M6281 Muscle weakness (generalized): Secondary | ICD-10-CM

## 2020-09-09 NOTE — Therapy (Signed)
[facility] MAIN [facility] SERVICES [address] [place], [place], [address] Phone: [phone]   Fax:  [phone]  Physical Therapy Treatment  Patient Details  Name: Chad Hard Mihelich Sr. MRN: [id number] Date of Birth: 1934-04-19 Referring Provider (PT): Dr. Sampson Goon   Encounter Date: 09/09/2020   PT End of Session - 09/09/20 0817    Visit Number 4    Number of Visits 25    Date for PT Re-Evaluation 10/28/20    Authorization Type Eval: 4/5; 1/10 PN    Authorization - Visit Number 4    Authorization - Number of Visits 10    PT Start Time 0820    PT Stop Time 0905    PT Time Calculation (min) 45 min    Equipment Utilized During Treatment Gait belt    Activity Tolerance Patient tolerated treatment well    Behavior During Therapy WFL for tasks assessed/performed           Past Medical History:  Diagnosis Date  . Coronary artery disease   . Depression   . Diabetes mellitus without complication (HCC)   . Seizures (HCC)   . Stroke (HCC)   . Vascular dementia [facility])     Past Surgical History:  Procedure Laterality Date  . CARDIAC SURGERY      Vitals:   09/09/20 0825  BP: (!) 95/50  Pulse: (!) 55     Subjective Assessment - 09/09/20 0825    Subjective Pt reports doing well; Spouse reports he tripped in the office yesterday and fell. Patient presents to therapy with RW. Denies any pain;    Pertinent History Pt is a 87 y.o. male referred to PT for balance and gait due to recurrent falls. He presents with a PMH of: CAD, depression, DM, seizures, CVA, vascular dementia. 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 Pt relies heavily on spouse for history due to baseline cognitive defcits due to his vascular dementia. Hx of orthostatics due to poor liquid intake likely due to his vascular dementia in the past. Pt with spouse at eval and states his referral is for gait and imbalance. Pt and spouse reports on average he was having 1 fall/week  approximately with no specific reason for his falls. Pt relies on chair or bed to stand up from or calls to neighbors to assist in returning pt upright. Pt and spouse repotr pt did have significant fall at the end of january leading to a L shoulder fracture. Unable to specify if any weightbearing precautions. Denies falls at night in low light and a decrease of activity levels since not going to adult daycare due to covid 19 concerns. History of CVA that affected his L side with reported weakness which pt's spouse is curious if this could be affecting his balance. Spouse assists with ADL's such as dressing, bathing, cooking, cleaning. Pt and spouse reports pt spends most of his ambulation at the home inside. Pt's goal is to stop falling and improve his walking.    Limitations Lifting;Standing;Walking    How long can you sit comfortably? N/A    How long can you stand comfortably? a few minutes    How long can you walk comfortably? short household distances    Patient Stated Goals Stop falling, improve walking    Currently in Pain? No/denies    Multiple Pain Sites No                 TREATMENT: Vitals  assessed, see above- patient denies any symptoms of dizziness; Spouse reports he took all meds and has eaten breakfast;  Pt ambulated around gym x80 feet with RW, CGA for safety with cues for gait safety; Patient exhibits increased LLE knee instability with short stance time and decreased step length on RLE. Patient also required cues to stay inside RW especially during turns;   Instructed patient in LE strengthening exercise: Standing beside railing:  With 2# ankle weight on BLE:  -hip flexion march x10 reps x2 sets with 1 rail assist with cues for erect posture and increased ROM to tolerance; -BLE heel raises x10 reps x2 sets with 2 HHA and cues for knee extension for better calf strengthening; -BLE heel raises x10 reps x2 sets -Hip extension x10 reps x2 sets Patient required min-mod VCs  for proper positioning including to improve erect posture for better hip/LE strengthening and improved motor activation;    Leg Press: BLE 40# x10 reps LLE only 25# 2x10 with min A for positioning and min-mod VCs for proper technique for optimal quad activation and motor control;   Following exercise, patient instructed in gait with RW, x80 feet with CGA for safety with slight improvement in weight bearing on LLE but patient continues to have uneven cadence with short stance on LLE and decreased step length on RLE with antalgic like gait pattern due to weakness in LLE; Patient also exhibits increased difficulty turning with walker getting too far out in front requiring min A for balance recovery;   Weaving around cones #5 with RW x4 sets with mod Vcs for gait safety including to stay inside RW, keep walker down on floor (avoid lifting walker) and increase step length for better foot clearance and gait safety; Patient does require CGA to min A for safety; He had a harder time negotiating cones compared to walking straight forward often lifting RW and becoming more unsteady;  Patient would benefit from additional gait training with RW to improve awareness and assist patient with remembering to use it.    Patient tolerated session fair. He reports increased fatigue in LLE and difficulty with prolonged standing/walking. Patient required min-mod VCs for erect posture and weight shift for better stance control.   He denies any pain at end of session. He does report increased fatigue at end of session.                       PT Education - 09/09/20 0817    Education Details balance/gait safety, HEP    Person(s) Educated Patient;Spouse    Methods Explanation;Verbal cues    Comprehension Verbalized understanding;Returned demonstration;Verbal cues required;Need further instruction            PT Short Term Goals - 08/05/20 1003      PT SHORT TERM GOAL #1   Title Pt will be  independent with HEP to improve strength/mobility to improve independence with ADL's    Baseline 08/05/2020: Seated marches, long arc quads    Time 6    Period Weeks    Status New    Target Date 09/16/20             PT Long Term Goals - 08/05/20 1026      PT LONG TERM GOAL #1   Title Pt will improve 5xSTS to < 15 sec for clinically significant increase in LE strength and decreased risk of falls.    Baseline 4/5: 20.77 sec with RUE support on chair rail  Time 12    Period Weeks    Status New    Target Date 10/28/20      PT LONG TERM GOAL #2   Title Pt will improve 10 m gait speed to > 0.8 m/s with LRAD to improve safety with household ambulation and decrease risk of falls.    Baseline 4/5: Deferred due to orthostasis    Time 12    Period Weeks    Status New    Target Date 10/28/20      PT LONG TERM GOAL #3   Title Pt will improve Berg balance by 8 points as evidenced for age norms to display clinically significant reduced risk of falls for improved independence with ADL completion and walking tasks.    Baseline 4/5: deferred due to orthostasis    Time 12    Period Weeks    Status New    Target Date 10/28/20      PT LONG TERM GOAL #4   Title Pt will improve FOTO to target score equal or greater than 62 to maximize capcacity to improve independence with mobility    Baseline 4/5: 59/62    Time 12    Period Weeks    Status New    Target Date 10/28/20                 Plan - 09/09/20 0913    Clinical Impression Statement Patient motivated and participated fair within session. Vitals monitored this session with lower BP observed, however patient is asymptomatic. Patient instructed in advanced LE strengthening exercise. He requires min-mod VCs for proper positioning and exercise technique. He was instructed in gait training with RW. Patient initially exhibits heavy weight shift on RLE with decreased stance time on LLE due to weakness in LLE. However following standing  exercise, he is able to exhibit better weight acceptance to LLE. However patient fatigues quickly and reverts back to short stance time and heavy stance on RLE with prolonged ambulation. He had increased difficuty weaving around cones due to imbalance and fatigue. patient would benefit from additional skilled PT intervention to improve strength, balance and gait safety; Recommend additional gait training with RW to improve safety with AD at home.    Personal Factors and Comorbidities Age;Comorbidity 3+;Time since onset of injury/illness/exacerbation;Past/Current Experience    Examination-Activity Limitations Bathing;Bed Mobility;Squat;Stairs;Bend;Locomotion Level;Stand;Caring for Others;Reach Overhead;Transfers;Dressing    Examination-Participation Restrictions Church;Cleaning;Shop;Community Activity;Driving;Yard Work    Stability/Clinical Decision Making Unstable/Unpredictable    Rehab Potential Fair    PT Frequency 2x / week    PT Duration 12 weeks    PT Treatment/Interventions ADLs/Self Care Home Management;DME Instruction;Gait training;Stair training;Functional mobility training;Therapeutic activities;Therapeutic exercise;Balance training;Neuromuscular re-education;Patient/family education;Manual techniques;Passive range of motion;Energy conservation    PT Next Visit Plan Reassess orthostatics. Complete 10 m gait speed, Berg., if pt able    PT Home Exercise Plan Educated pt and spouse on seated ROM exerices to assist elevating BP.    Consulted and Agree with Plan of Care Patient;Family member/caregiver    Family Member Consulted spouse           Patient will benefit from skilled therapeutic intervention in order to improve the following deficits and impairments:  Abnormal gait,Improper body mechanics,Decreased mobility,Postural dysfunction,Decreased activity tolerance,Decreased endurance,Decreased range of motion,Decreased strength,Decreased balance,Decreased safety awareness,Difficulty  walking  Visit Diagnosis: Muscle weakness (generalized)  Other abnormalities of gait and mobility  Unsteadiness on feet  Difficulty in walking, not elsewhere classified     Problem List There are  no problems to display for this patient.   Chad [name] PT, DPT 09/09/2020, 9:19 AM  [facility] MAIN [facility] [address] [place], [place], [address] Phone: [phone]   Fax:  [phone]  Name: Chad Patteelva [facility] Sr. MRN: [id number] Date of Birth: December 04, 1933

## 2020-09-11 ENCOUNTER — Other Ambulatory Visit: Payer: Self-pay

## 2020-09-11 ENCOUNTER — Ambulatory Visit: Payer: Medicare Other | Admitting: Physical Therapy

## 2020-09-11 ENCOUNTER — Encounter: Payer: Self-pay | Admitting: Physical Therapy

## 2020-09-11 DIAGNOSIS — R2681 Unsteadiness on feet: Secondary | ICD-10-CM

## 2020-09-11 DIAGNOSIS — R262 Difficulty in walking, not elsewhere classified: Secondary | ICD-10-CM

## 2020-09-11 DIAGNOSIS — R2689 Other abnormalities of gait and mobility: Secondary | ICD-10-CM

## 2020-09-11 DIAGNOSIS — M6281 Muscle weakness (generalized): Secondary | ICD-10-CM

## 2020-09-11 NOTE — Therapy (Signed)
[facility] MAIN [facility] SERVICES [address] [place], [place], [address] Phone: [phone]   Fax:  [phone]  Physical Therapy Treatment  Patient Details  Name: Chad Belue Gilson Sr. MRN: [id number] Date of Birth: 12-14-1933 Referring Provider (PT): Dr. Sampson Goon   Encounter Date: 09/11/2020   PT End of Session - 09/11/20 0946    Visit Number 5    Number of Visits 25    Date for PT Re-Evaluation 10/28/20    Authorization Type Eval: 4/5; 1/10 PN    Authorization - Visit Number 5    Authorization - Number of Visits 10    PT Start Time 0935    PT Stop Time 1018    PT Time Calculation (min) 43 min    Equipment Utilized During Treatment Gait belt    Activity Tolerance Patient tolerated treatment well    Behavior During Therapy WFL for tasks assessed/performed           Past Medical History:  Diagnosis Date  . Coronary artery disease   . Depression   . Diabetes mellitus without complication (HCC)   . Seizures (HCC)   . Stroke (HCC)   . Vascular dementia [facility])     Past Surgical History:  Procedure Laterality Date  . CARDIAC SURGERY      There were no vitals filed for this visit.   Subjective Assessment - 09/11/20 0945    Subjective Pt reports doing well; Spouse reports he is having a hard time using the walker at home because of the limited space. She hopes he is using the walker at daycare but she isn't sure.    Pertinent History Pt is a 87 y.o. male referred to PT for balance and gait due to recurrent falls. He presents with a PMH of: CAD, depression, DM, seizures, CVA, vascular dementia. 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 Pt relies heavily on spouse for history due to baseline cognitive defcits due to his vascular dementia. Hx of orthostatics due to poor liquid intake likely due to his vascular dementia in the past. Pt with spouse at eval and states his referral is for gait and imbalance. Pt and spouse reports on average  he was having 1 fall/week approximately with no specific reason for his falls. Pt relies on chair or bed to stand up from or calls to neighbors to assist in returning pt upright. Pt and spouse repotr pt did have significant fall at the end of january leading to a L shoulder fracture. Unable to specify if any weightbearing precautions. Denies falls at night in low light and a decrease of activity levels since not going to adult daycare due to covid 19 concerns. History of CVA that affected his L side with reported weakness which pt's spouse is curious if this could be affecting his balance. Spouse assists with ADL's such as dressing, bathing, cooking, cleaning. Pt and spouse reports pt spends most of his ambulation at the home inside. Pt's goal is to stop falling and improve his walking.    Limitations Lifting;Standing;Walking    How long can you sit comfortably? N/A    How long can you stand comfortably? a few minutes    How long can you walk comfortably? short household distances    Patient Stated Goals Stop falling, improve walking    Currently in Pain? No/denies    Multiple Pain Sites No             TREATMENT:  Warm up on Nustep BUE/BLE level 2 x4 min with cues to keep steps per minute >40 for cardiovascular endurance;   Pt ambulated around gym x80 feet with RW, min A for safety with cues for gait safety; Patient exhibits increased LLE knee instability with short stance time and decreased step length on RLE. Patient also required cues to stay inside RW especially during turns; He required increased cues for direction and RW positioning;    Instructed patient in LE strengthening exercise: Standing beside railing:  With 2# ankle weight on BLE:  -hip flexion march x15 reps x2 sets with 1 rail assist with cues for erect posture and increased ROM to tolerance; -BLE heel raises x15 reps x2 sets with 2 HHA and cues for knee extension for better calf strengthening; -Hip abduction x10 reps x2  sets -Hip extension x10 reps x2 sets Patient required min-mod VCs for proper positioning including to improve erect posture for better hip/LE strengthening and improved motor activation;  He has increased difficulty with weight bearing through LLE often leaning forward and collapsing on left hip with trendelenburg positioning;   BP after exercise, 105/67  Following exercise, patient instructed in gait with RW, x80 feet with CGA for safety with slight improvement in weight bearing on LLE but patient continues to have uneven cadence with short stance on LLE and decreased step length on RLE with antalgic like gait pattern due to weakness in LLE; Patient also exhibits increased difficulty turning with walker getting too far out in front requiring min A for balance recovery;    Patient would benefit from additional gait training with RW to improve awareness and assist patient with remembering to use it.  Educated patient and caregiver the importance of patient using the same assistive device at all times for better memory and awareness. Currently he is using the RW at daycare and the quad cane at home. His wife states that she isn't sure there is enough room at home for the walker but agreed that switching assistive devices could cause confusion;   Patient tolerated session fair. He reports increased fatigue in LLE and difficulty with prolonged standing/walking. Patient required min-mod VCs for erect posture and weight shift for better stance control.   He denies any pain at end of session. He does report increased fatigue at end of session.                          PT Education - 09/11/20 0946    Education Details balance/gait safety, HEP    Person(s) Educated Patient;Spouse    Methods Explanation;Verbal cues    Comprehension Verbalized understanding;Returned demonstration;Verbal cues required;Need further instruction            PT Short Term Goals - 08/05/20  1003      PT SHORT TERM GOAL #1   Title Pt will be independent with HEP to improve strength/mobility to improve independence with ADL's    Baseline 08/05/2020: Seated marches, long arc quads    Time 6    Period Weeks    Status New    Target Date 09/16/20             PT Long Term Goals - 08/05/20 1026      PT LONG TERM GOAL #1   Title Pt will improve 5xSTS to < 15 sec for clinically significant increase in LE strength and decreased risk of falls.    Baseline 4/5: 20.77 sec with RUE support  on chair rail    Time 12    Period Weeks    Status New    Target Date 10/28/20      PT LONG TERM GOAL #2   Title Pt will improve 10 m gait speed to > 0.8 m/s with LRAD to improve safety with household ambulation and decrease risk of falls.    Baseline 4/5: Deferred due to orthostasis    Time 12    Period Weeks    Status New    Target Date 10/28/20      PT LONG TERM GOAL #3   Title Pt will improve Berg balance by 8 points as evidenced for age norms to display clinically significant reduced risk of falls for improved independence with ADL completion and walking tasks.    Baseline 4/5: deferred due to orthostasis    Time 12    Period Weeks    Status New    Target Date 10/28/20      PT LONG TERM GOAL #4   Title Pt will improve FOTO to target score equal or greater than 62 to maximize capcacity to improve independence with mobility    Baseline 4/5: 59/62    Time 12    Period Weeks    Status New    Target Date 10/28/20                 Plan - 09/11/20 1011    Clinical Impression Statement Patient motivated and participated fair within session. He exhibits increased weakness in LLE today with increased instability noted. He denies any pain. Patient instructed in progressive LE strengthening with increased repetition. Instructed patient in standing exercise to facilitate better weight acceptance on LLE for better stance control. He required mod VCs for proper exercise technique and  positioning as he was more confused this session and required cues for redirection to task. He does fatigue quickly requiring short seated rest break. Patient would benefit from additional skilled PT intervention to improve strength, balance and mobility;    Personal Factors and Comorbidities Age;Comorbidity 3+;Time since onset of injury/illness/exacerbation;Past/Current Experience    Examination-Activity Limitations Bathing;Bed Mobility;Squat;Stairs;Bend;Locomotion Level;Stand;Caring for Others;Reach Overhead;Transfers;Dressing    Examination-Participation Restrictions Church;Cleaning;Shop;Community Activity;Driving;Yard Work    Stability/Clinical Decision Making Unstable/Unpredictable    Rehab Potential Fair    PT Frequency 2x / week    PT Duration 12 weeks    PT Treatment/Interventions ADLs/Self Care Home Management;DME Instruction;Gait training;Stair training;Functional mobility training;Therapeutic activities;Therapeutic exercise;Balance training;Neuromuscular re-education;Patient/family education;Manual techniques;Passive range of motion;Energy conservation    PT Next Visit Plan Reassess orthostatics. Complete 10 m gait speed, Berg., if pt able    PT Home Exercise Plan Educated pt and spouse on seated ROM exerices to assist elevating BP.    Consulted and Agree with Plan of Care Patient;Family member/caregiver    Family Member Consulted spouse           Patient will benefit from skilled therapeutic intervention in order to improve the following deficits and impairments:  Abnormal gait,Improper body mechanics,Decreased mobility,Postural dysfunction,Decreased activity tolerance,Decreased endurance,Decreased range of motion,Decreased strength,Decreased balance,Decreased safety awareness,Difficulty walking  Visit Diagnosis: Muscle weakness (generalized)  Other abnormalities of gait and mobility  Unsteadiness on feet  Difficulty in walking, not elsewhere classified     Problem  List There are no problems to display for this patient.   Carlina Derks PT, DPT 09/11/2020, 10:45 AM  [facility] MAIN [facility] SERVICES [address] [place], [place], [address] Phone: [phone]  Fax:  [phone]  Name: Chad Patteelva J Olejnik Sr. MRN: [id number] Date of Birth: 11-17-1933

## 2020-09-16 ENCOUNTER — Ambulatory Visit: Payer: Medicare Other | Admitting: Physical Therapy

## 2020-09-16 ENCOUNTER — Encounter: Payer: Self-pay | Admitting: Physical Therapy

## 2020-09-16 ENCOUNTER — Other Ambulatory Visit: Payer: Self-pay

## 2020-09-16 DIAGNOSIS — R2681 Unsteadiness on feet: Secondary | ICD-10-CM | POA: Diagnosis not present

## 2020-09-16 DIAGNOSIS — M6281 Muscle weakness (generalized): Secondary | ICD-10-CM

## 2020-09-16 DIAGNOSIS — R2689 Other abnormalities of gait and mobility: Secondary | ICD-10-CM

## 2020-09-16 DIAGNOSIS — R262 Difficulty in walking, not elsewhere classified: Secondary | ICD-10-CM

## 2020-09-16 NOTE — Therapy (Signed)
[place] [facility] [address] [place], [place], [address] Phone: [phone]   Fax:  [phone]  Physical Therapy Treatment  Patient Details  Name: Chad Patteelva J Vonbehren Sr. MRN: [id number] Date of Birth: August 23, 1933 Referring Provider (PT): Dr. Sampson GoonFitzgerald   Encounter Date: 09/16/2020   PT End of Session - 09/16/20 0856    Visit Number 6    Number of Visits 25    Date for PT Re-Evaluation 10/28/20    Authorization Type Eval: 4/5; 1/10 PN    Authorization - Visit Number 6    Authorization - Number of Visits 10    PT Start Time [phone] [id number]    PT Stop Time 0930    PT Time Calculation (min) 44 min    Equipment Utilized During Treatment Gait belt    Activity Tolerance Patient tolerated treatment well    Behavior During Therapy WFL for tasks assessed/performed           Past Medical History:  Diagnosis Date  . Coronary artery disease   . Depression   . Diabetes mellitus without complication (HCC)   . Seizures (HCC)   . Stroke (HCC)   . Vascular dementia [facility](HCC)     Past Surgical History:  Procedure Laterality Date  . CARDIAC SURGERY      There were no vitals filed for this visit.   Subjective Assessment - 09/16/20 0856    Subjective Pt reports doing well; Spouse reports he is having a hard time using the walker at home because of the limited space. She reports he hasn't had any new falls;    Pertinent History Pt is a 87 y.o. male referred to PT for balance and gait due to recurrent falls. He presents with a PMH of: CAD, depression, DM, seizures, CVA, vascular dementia. 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 Pt relies heavily on spouse for history due to baseline cognitive defcits due to his vascular dementia. Hx of orthostatics due to poor liquid intake likely due to his vascular dementia in the past. Pt with spouse at eval and states his referral is for gait and imbalance. Pt and spouse reports on average he was having 1  fall/week approximately with no specific reason for his falls. Pt relies on chair or bed to stand up from or calls to neighbors to assist in returning pt upright. Pt and spouse repotr pt did have significant fall at the end of january leading to a L shoulder fracture. Unable to specify if any weightbearing precautions. Denies falls at night in low light and a decrease of activity levels since not going to adult daycare due to covid 19 concerns. History of CVA that affected his L side with reported weakness which pt's spouse is curious if this could be affecting his balance. Spouse assists with ADL's such as dressing, bathing, cooking, cleaning. Pt and spouse reports pt spends most of his ambulation at the home inside. Pt's goal is to stop falling and improve his walking.    Limitations Lifting;Standing;Walking    How long can you sit comfortably? N/A    How long can you stand comfortably? a few minutes    How long can you walk comfortably? short household distances    Patient Stated Goals Stop falling, improve walking    Currently in Pain? No/denies    Multiple Pain Sites No               TREATMENT: Warm up on  Nustep BUE/BLE level 2 x4 min with cues to keep steps per minute >50 for cardiovascular endurance;    Instructed patient in LE strengthening exercise: Standing in parallel bars:  With 2# ankle weight on BLE: -hip flexion march x15reps x1 sets with 1 rail assist with cues for erect posture and increased ROM to tolerance; -BLE heel raises x15reps x1 setswith 2 HHA and cues for knee extension for better calf strengthening; -Hip abduction x15 reps x1 sets -Hip extension x15 reps x1 sets Seated LAQ x15 reps each LE with visual cues to increase ROM for better strengthening;  Patient required min-mod VCs for proper positioning including to improve erect posture for better hip/LE strengthening and improved motor activation;  He has increased difficulty with weight bearing through LLE  often leaning forward and collapsing on left hip with trendelenburg positioning;   BP after exercise, 115/47  Following exercise, patient instructed in gait with RW, x80 feet x2 sets with CGA for safety with slight improvement in weight bearing on LLE but patient continues to have uneven cadence with short stance on LLE and decreased step length on RLE with antalgic like gait pattern due to weakness in LLE; Patient also exhibits increased difficulty turning with walker getting too far out in front requiring min A for balance recovery;  Gait weaving around cones #4 x4 reps with min A and mod VCs for walker placement and sequencing. Patient often tries lifting walker or lets walker get too far in front of him making turning and cone negotiation difficult.  Patient would benefit from additional gait training with RW to improve awareness and assist patient with remembering to use it.  Educated patient and caregiver the importance of patient using the same assistive device at all times for better memory and awareness. Currently he is using the RW at daycare and the quad cane at home. His wife states that she isn't sure there is enough room at home for the walker but agreed that switching assistive devices could cause confusion;  Adjusted walker to proper height. Caregiver concerned walker was too tall, however walker at lowest setting. It is too tall if patient is slouched but when he is standing with erect posture it is correct height. Adjusted walker so that wheels were on the outside of the walker as they had been placed on inside of walker which could make the walker easier to tip over.   Patient tolerated sessionfair. He reports increased fatigue in LLE and difficulty with prolonged standing/walking. Patient required min-mod VCs for erect posture and weight shift for better stance control.  He denies any pain at end of session. He does report increased fatigue at end of  session.                         PT Education - 09/16/20 0856    Education Details balance/gait safety, HEP    Person(s) Educated Patient;Spouse    Methods Explanation;Verbal cues    Comprehension Verbalized understanding;Returned demonstration;Verbal cues required;Need further instruction            PT Short Term Goals - 08/05/20 1003      PT SHORT TERM GOAL #1   Title Pt will be independent with HEP to improve strength/mobility to improve independence with ADL's    Baseline 08/05/2020: Seated marches, long arc quads    Time 6    Period Weeks    Status New    Target Date 09/16/20  PT Long Term Goals - 08/05/20 1026      PT LONG TERM GOAL #1   Title Pt will improve 5xSTS to < 15 sec for clinically significant increase in LE strength and decreased risk of falls.    Baseline 4/5: 20.77 sec with RUE support on chair rail    Time 12    Period Weeks    Status New    Target Date 10/28/20      PT LONG TERM GOAL #2   Title Pt will improve 10 m gait speed to > 0.8 m/s with LRAD to improve safety with household ambulation and decrease risk of falls.    Baseline 4/5: Deferred due to orthostasis    Time 12    Period Weeks    Status New    Target Date 10/28/20      PT LONG TERM GOAL #3   Title Pt will improve Berg balance by 8 points as evidenced for age norms to display clinically significant reduced risk of falls for improved independence with ADL completion and walking tasks.    Baseline 4/5: deferred due to orthostasis    Time 12    Period Weeks    Status New    Target Date 10/28/20      PT LONG TERM GOAL #4   Title Pt will improve FOTO to target score equal or greater than 62 to maximize capcacity to improve independence with mobility    Baseline 4/5: 59/62    Time 12    Period Weeks    Status New    Target Date 10/28/20                 Plan - 09/16/20 0943    Clinical Impression Statement Patient motivated and  participated well within session. He was instructed in advanced LE strengthening. patient does have difficulty with prolonged standing often stooping over and collapsing on LLE with decreased LLE weight shift. Patient continues to require min-mod VCs for proper positioning and exercise technique. He has difficulty with increased repetition often forgetting what he is supposed to be doing due to poor memory/attention. Patient instructed in advanced gait tasks including weaving around cones. He had a difficult time managing RW with turning often trying to lift walker or letting it get too far out in front of him. He would benefit from additional skilled PT Intervention to improve strength, balance and gait safety; PT adjusted RW for proper height and safety.    Personal Factors and Comorbidities Age;Comorbidity 3+;Time since onset of injury/illness/exacerbation;Past/Current Experience    Examination-Activity Limitations Bathing;Bed Mobility;Squat;Stairs;Bend;Locomotion Level;Stand;Caring for Others;Reach Overhead;Transfers;Dressing    Examination-Participation Restrictions Church;Cleaning;Shop;Community Activity;Driving;Yard Work    Stability/Clinical Decision Making Unstable/Unpredictable    Rehab Potential Fair    PT Frequency 2x / week    PT Duration 12 weeks    PT Treatment/Interventions ADLs/Self Care Home Management;DME Instruction;Gait training;Stair training;Functional mobility training;Therapeutic activities;Therapeutic exercise;Balance training;Neuromuscular re-education;Patient/family education;Manual techniques;Passive range of motion;Energy conservation    PT Next Visit Plan Reassess orthostatics. Complete 10 m gait speed, Berg., if pt able    PT Home Exercise Plan Educated pt and spouse on seated ROM exerices to assist elevating BP.    Consulted and Agree with Plan of Care Patient;Family member/caregiver    Family Member Consulted spouse           Patient will benefit from skilled  therapeutic intervention in order to improve the following deficits and impairments:  Abnormal gait,Improper body mechanics,Decreased mobility,Postural dysfunction,Decreased activity  tolerance,Decreased endurance,Decreased range of motion,Decreased strength,Decreased balance,Decreased safety awareness,Difficulty walking  Visit Diagnosis: Muscle weakness (generalized)  Other abnormalities of gait and mobility  Unsteadiness on feet  Difficulty in walking, not elsewhere classified     Problem List There are no problems to display for this patient.   Messina Kosinski PT, DPT 09/16/2020, 9:45 AM  [place] [facility] MAIN [facility] SERVICES [address] [place], [place], [address] Phone: [phone]   Fax:  [phone]  Name: Chad Franchino [facility] Sr. MRN: [id number] Date of Birth: [birth year]

## 2020-09-18 ENCOUNTER — Other Ambulatory Visit: Payer: Self-pay

## 2020-09-18 ENCOUNTER — Ambulatory Visit: Payer: Medicare Other

## 2020-09-18 DIAGNOSIS — R2681 Unsteadiness on feet: Secondary | ICD-10-CM

## 2020-09-18 DIAGNOSIS — R262 Difficulty in walking, not elsewhere classified: Secondary | ICD-10-CM

## 2020-09-18 DIAGNOSIS — M6281 Muscle weakness (generalized): Secondary | ICD-10-CM

## 2020-09-18 DIAGNOSIS — R2689 Other abnormalities of gait and mobility: Secondary | ICD-10-CM

## 2020-09-18 NOTE — Therapy (Signed)
[place] [facility] MAIN [facility] SERVICES [address] [place], [place], [address] Phone: [phone]   Fax:  [phone]  Physical Therapy Treatment  Patient Details  Name: Chad [name]. MRN: [id number] Date of Birth: [birth year] Referring Provider (PT): Dr. Sampson Goon   Encounter Date: 09/18/2020   PT End of Session - 09/18/20 0902    Visit Number 7    Number of Visits 25    Date for PT Re-Evaluation 10/28/20    Authorization Type Eval: 4/5; 1/10 PN    Authorization - Visit Number 7    Authorization - Number of Visits 10    PT Start Time 0853    PT Stop Time 0937    PT Time Calculation (min) 44 min    Equipment Utilized During Treatment Gait belt    Activity Tolerance Patient tolerated treatment well    Behavior During Therapy WFL for tasks assessed/performed           Past Medical History:  Diagnosis Date  . Coronary artery disease   . Depression   . Diabetes mellitus without complication (HCC)   . Seizures (HCC)   . Stroke (HCC)   . Vascular dementia [facility])     Past Surgical History:  Procedure Laterality Date  . CARDIAC SURGERY      There were no vitals filed for this visit.   Subjective Assessment - 09/18/20 0859    Subjective Pt reports he is doing well and is the "same old [name]".  Pt notes he is not using his walker as much as he probably should.  Pt notes he has not fallen since last visit.    Pertinent History Pt is a 87 y.o. male referred to PT for balance and gait due to recurrent falls. He presents with a PMH of: CAD, depression, DM, seizures, CVA, vascular dementia. 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 Pt relies heavily on spouse for history due to baseline cognitive defcits due to his vascular dementia. Hx of orthostatics due to poor liquid intake likely due to his vascular dementia in the past. Pt with spouse at eval and states his referral is for gait and imbalance. Pt and spouse reports on average he was having  1 fall/week approximately with no specific reason for his falls. Pt relies on chair or bed to stand up from or calls to neighbors to assist in returning pt upright. Pt and spouse repotr pt did have significant fall at the end of january leading to a L shoulder fracture. Unable to specify if any weightbearing precautions. Denies falls at night in low light and a decrease of activity levels since not going to adult daycare due to covid 19 concerns. History of CVA that affected his L side with reported weakness which pt's spouse is curious if this could be affecting his balance. Spouse assists with ADL's such as dressing, bathing, cooking, cleaning. Pt and spouse reports pt spends most of his ambulation at the home inside. Pt's goal is to stop falling and improve his walking.    Limitations Lifting;Standing;Walking    How long can you sit comfortably? N/A    How long can you stand comfortably? a few minutes    How long can you walk comfortably? short household distances    Patient Stated Goals Stop falling, improve walking             TREATMENT: Warm up on Nustep BUE/BLE level 2 x6 min with cues to  keep steps per minute >50 for cardiovascular endurance;    Instructed patient in LE strengthening exercise: Standing in parallel bars:  With 2# ankle weight on BLE:  -hip flexion march x15 reps x1 sets with 1 rail assist with cues for erect posture and increased ROM to tolerance; -BLE heel raises x15 reps x1 sets with 2 HHA and cues for knee extension for better calf strengthening; -Hip abduction x15 reps x1 sets -Hip extension x15 reps x1 sets  -Seated LAQ x15 reps each LE with visual cues to increase ROM for better strengthening;  -Seated marches x15 reps each LE with visual cues for proper sequencing  Pt required min-mod verbal and visual cues at time for proper positioning in order to improve posture.  Pt has difficulty specifically with heel raises and bending knees to perform.  Pt also  demonstrating Trendelenburg as noted in past sessions, and is difficult to correct at times due to weakness.   Following TherEx, pt was instructed on gait training with use of RW in hallway with multiple bouts down and back.  Pt continues to have decreased dorsiflexion on the L LE that causes him to drag his foot at times during ambulation.  Therapist utilized green theraband to provide dorsiflexion support when ambulating and that was beneficial.  Pt's wife was also advised on how to perform at home for training at home.  Pt also performed head turns with character/number/letter recognition when ambulating down the hallway with RW.  Pt does have some decreased cadence and some veering when performing head turns, however was able to redirect as instructed.  Pt still has difficulty with turning in the hallway with the RW and will continue to improve as therapy progresses.      Clinical impression:  Pt motivated and participated well within session.  Pt was instructed and guided throughout therapeutic exercises necessary for strengthening.  Pt still continues to demonstrate Trendelenburg and decreased weight shifting on the L LE , but is able to correct with verbal and tactiel cuing throughout ession.  Pt performed higher functioning gait tasks in hallway with use of theraband for dorsiflexion support.  Pt tolerated treatment fair noting fatigue towards the end of the session folling ambulation and exercises.  No pain was noted.                          PT Education - 09/18/20 1012    Education Details balance/gait safety, use of theraband as used for AFO.    Person(s) Educated Patient;Spouse    Methods Explanation;Verbal cues    Comprehension Verbalized understanding;Returned demonstration;Verbal cues required;Need further instruction            PT Short Term Goals - 08/05/20 1003      PT SHORT TERM GOAL #1   Title Pt will be independent with HEP to improve  strength/mobility to improve independence with ADL's    Baseline 08/05/2020: Seated marches, long arc quads    Time 6    Period Weeks    Status New    Target Date 09/16/20             PT Long Term Goals - 08/05/20 1026      PT LONG TERM GOAL #1   Title Pt will improve 5xSTS to < 15 sec for clinically significant increase in LE strength and decreased risk of falls.    Baseline 4/5: 20.77 sec with RUE support on chair rail  Time 12    Period Weeks    Status New    Target Date 10/28/20      PT LONG TERM GOAL #2   Title Pt will improve 10 m gait speed to > 0.8 m/s with LRAD to improve safety with household ambulation and decrease risk of falls.    Baseline 4/5: Deferred due to orthostasis    Time 12    Period Weeks    Status New    Target Date 10/28/20      PT LONG TERM GOAL #3   Title Pt will improve Berg balance by 8 points as evidenced for age norms to display clinically significant reduced risk of falls for improved independence with ADL completion and walking tasks.    Baseline 4/5: deferred due to orthostasis    Time 12    Period Weeks    Status New    Target Date 10/28/20      PT LONG TERM GOAL #4   Title Pt will improve FOTO to target score equal or greater than 62 to maximize capcacity to improve independence with mobility    Baseline 4/5: 59/62    Time 12    Period Weeks    Status New    Target Date 10/28/20                 Plan - 09/18/20 9323    Clinical Impression Statement Pt motivated and participated well within session.  Pt was instructed and guided throughout therapeutic exercises necessary for strengthening.  Pt still continues to demonstrate Trendelenburg and decreased weight shifting on the L LE , but is able to correct with verbal and tactiel cuing throughout ession.  Pt performed higher functioning gait tasks in hallway with use of theraband for dorsiflexion support.  Pt tolerated treatment fair noting fatigue towards the end of the session  folling ambulation and exercises.  No pain was noted.    Personal Factors and Comorbidities Age;Comorbidity 3+;Time since onset of injury/illness/exacerbation;Past/Current Experience    Examination-Activity Limitations Bathing;Bed Mobility;Squat;Stairs;Bend;Locomotion Level;Stand;Caring for Others;Reach Overhead;Transfers;Dressing    Examination-Participation Restrictions Church;Cleaning;Shop;Community Activity;Driving;Yard Work    Stability/Clinical Decision Making Unstable/Unpredictable    Rehab Potential Fair    PT Frequency 2x / week    PT Duration 12 weeks    PT Treatment/Interventions ADLs/Self Care Home Management;DME Instruction;Gait training;Stair training;Functional mobility training;Therapeutic activities;Therapeutic exercise;Balance training;Neuromuscular re-education;Patient/family education;Manual techniques;Passive range of motion;Energy conservation    PT Next Visit Plan Reassess orthostatics. Complete 10 m gait speed, Berg., if pt able    PT Home Exercise Plan Educated pt and spouse on seated ROM exerices to assist elevating BP.    Consulted and Agree with Plan of Care Patient;Family member/caregiver    Family Member Consulted spouse           Patient will benefit from skilled therapeutic intervention in order to improve the following deficits and impairments:  Abnormal gait,Improper body mechanics,Decreased mobility,Postural dysfunction,Decreased activity tolerance,Decreased endurance,Decreased range of motion,Decreased strength,Decreased balance,Decreased safety awareness,Difficulty walking  Visit Diagnosis: Muscle weakness (generalized)  Other abnormalities of gait and mobility  Unsteadiness on feet  Difficulty in walking, not elsewhere classified     Problem List There are no problems to display for this patient.    Nolon Bussing, PT, DPT 09/18/20, 10:40 AM   [facility] MAIN [facility] SERVICES [address]  [place], [place], [address] Phone: [phone]   Fax:  [phone]  Name: Nicolus Ose [facility] [name]. MRN: [id number] Date  of Birth: 10/01/1933

## 2020-09-23 ENCOUNTER — Other Ambulatory Visit: Payer: Self-pay

## 2020-09-23 ENCOUNTER — Encounter: Payer: Self-pay | Admitting: Physical Therapy

## 2020-09-23 ENCOUNTER — Ambulatory Visit: Payer: Medicare Other | Admitting: Physical Therapy

## 2020-09-23 DIAGNOSIS — R262 Difficulty in walking, not elsewhere classified: Secondary | ICD-10-CM

## 2020-09-23 DIAGNOSIS — R2681 Unsteadiness on feet: Secondary | ICD-10-CM

## 2020-09-23 DIAGNOSIS — M6281 Muscle weakness (generalized): Secondary | ICD-10-CM

## 2020-09-23 DIAGNOSIS — R2689 Other abnormalities of gait and mobility: Secondary | ICD-10-CM

## 2020-09-23 NOTE — Therapy (Signed)
[place] [facility] MAIN [facility] SERVICES [address] [place], [place], [address] Phone: [phone]   Fax:  [phone]  Physical Therapy Treatment  Patient Details  Name: Chad Chura Enloe Sr. MRN: [id number] Date of Birth: 1933-10-28 Referring Provider (PT): Dr. Sampson Goon   Encounter Date: 09/23/2020   PT End of Session - 09/23/20 0842    Visit Number 8    Number of Visits 25    Date for PT Re-Evaluation 10/28/20    Authorization Type Eval: 4/5; 1/10 PN    Authorization - Visit Number 8    Authorization - Number of Visits 10    PT Start Time 0845    PT Stop Time 0930    PT Time Calculation (min) 45 min    Equipment Utilized During Treatment Gait belt    Activity Tolerance Patient tolerated treatment well    Behavior During Therapy WFL for tasks assessed/performed           Past Medical History:  Diagnosis Date  . Coronary artery disease   . Depression   . Diabetes mellitus without complication (HCC)   . Seizures (HCC)   . Stroke (HCC)   . Vascular dementia [facility])     Past Surgical History:  Procedure Laterality Date  . CARDIAC SURGERY      There were no vitals filed for this visit.   Subjective Assessment - 09/23/20 0851    Subjective Patient reports doing well. He reports sleeping good last night. His wife reports no new falls. His wife states, "He does better over here than he does anywhere else."    Pertinent History Pt is a 87 y.o. male referred to PT for balance and gait due to recurrent falls. He presents with a PMH of: CAD, depression, DM, seizures, CVA, vascular dementia. 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 Pt relies heavily on spouse for history due to baseline cognitive defcits due to his vascular dementia. Hx of orthostatics due to poor liquid intake likely due to his vascular dementia in the past. Pt with spouse at eval and states his referral is for gait and imbalance. Pt and spouse reports on average he was having 1  fall/week approximately with no specific reason for his falls. Pt relies on chair or bed to stand up from or calls to neighbors to assist in returning pt upright. Pt and spouse repotr pt did have significant fall at the end of january leading to a L shoulder fracture. Unable to specify if any weightbearing precautions. Denies falls at night in low light and a decrease of activity levels since not going to adult daycare due to covid 19 concerns. History of CVA that affected his L side with reported weakness which pt's spouse is curious if this could be affecting his balance. Spouse assists with ADL's such as dressing, bathing, cooking, cleaning. Pt and spouse reports pt spends most of his ambulation at the home inside. Pt's goal is to stop falling and improve his walking.    Limitations Lifting;Standing;Walking    How long can you sit comfortably? N/A    How long can you stand comfortably? a few minutes    How long can you walk comfortably? short household distances    Patient Stated Goals Stop falling, improve walking    Currently in Pain? No/denies    Multiple Pain Sites No              TREATMENT: He reports doing exercises 1x  a week. He states, "I don't do them as much as I should."  Warm up on Nustep BUE/BLE level 2 x4 min with cues to keep steps per minute >60 for cardiovascular endurance;   Instructed patient in LE strengthening exercise: Standing in parallel bars:  With 2# ankle weight on BLE: -hip flexion march x15reps x1 sets with 1 rail assist with cues for erect posture and increased ROM to tolerance; -BLE heel raises x15reps x1 setswith 2 HHA and cues for knee extension for better calf strengthening; -Hip abductionx15 reps x1 sets -Hamstring curl knee flexion x15 reps x1 sets Seated LAQ x15 reps each LE with visual cues to increase ROM for better strengthening;  -Forward/backward walking x3 laps with 2 HHA and cues for increased foot clearance; -side stepping x3 laps  each direction; Patient required min VCs to keep feet oriented forward for better hip abductor strengthening;   Patient required min-mod VCs for proper positioning including to improve erect posture for better hip/LE strengthening and improved motor activation; He has increased difficulty with weight bearing through LLE often leaning forward and collapsing on left hip with trendelenburg positioning;  BP after exercise, 95/50  Following exercise, patient instructed in gait with RW, x80 feet x1 sets with CGA for safety with slight improvement in weight bearing on LLE but patient continues to have uneven cadence with short stance on LLE and decreased step length on RLE with antalgic like gait pattern due to weakness in LLE; Patient also exhibits increased difficulty turning with walker getting too far out in front requiring min A for balance recovery;  Gait weaving around cones #4 x2 reps with min A and mod VCs for walker placement and sequencing. Patient often tries lifting walker or lets walker get too far in front of him making turning and cone negotiation difficult.  Patient would benefit from additional gait training with RW to improve awareness and assist patient with remembering to use it.  Patient tolerated sessionfair. He reports increased fatigue in LLE and difficulty with prolonged standing/walking. Patient required min-mod VCs for erect posture and weight shift for better stance control.  He denies any pain at end of session. He does report increased fatigue at end of session.                    PT Education - 09/23/20 [id number]    Education Details LE strengthening, gait safety, HEP reinforced;    Person(s) Educated Patient;Spouse    Methods Explanation;Verbal cues    Comprehension Verbalized understanding;Returned demonstration;Verbal cues required;Need further instruction            PT Short Term Goals - 08/05/20 1003      PT SHORT TERM GOAL #1    Title Pt will be independent with HEP to improve strength/mobility to improve independence with ADL's    Baseline 08/05/2020: Seated marches, long arc quads    Time 6    Period Weeks    Status New    Target Date 09/16/20             PT Long Term Goals - 08/05/20 1026      PT LONG TERM GOAL #1   Title Pt will improve 5xSTS to < 15 sec for clinically significant increase in LE strength and decreased risk of falls.    Baseline 4/5: 20.77 sec with RUE support on chair rail    Time 12    Period Weeks    Status New    Target  Date 10/28/20      PT LONG TERM GOAL #2   Title Pt will improve 10 m gait speed to > 0.8 m/s with LRAD to improve safety with household ambulation and decrease risk of falls.    Baseline 4/5: Deferred due to orthostasis    Time 12    Period Weeks    Status New    Target Date 10/28/20      PT LONG TERM GOAL #3   Title Pt will improve Berg balance by 8 points as evidenced for age norms to display clinically significant reduced risk of falls for improved independence with ADL completion and walking tasks.    Baseline 4/5: deferred due to orthostasis    Time 12    Period Weeks    Status New    Target Date 10/28/20      PT LONG TERM GOAL #4   Title Pt will improve FOTO to target score equal or greater than 62 to maximize capcacity to improve independence with mobility    Baseline 4/5: 59/62    Time 12    Period Weeks    Status New    Target Date 10/28/20                 Plan - 09/23/20 1033    Clinical Impression Statement Patient motivated and participated well within session. He was instructed in LE strengthening exercise. Patient initially had difficulty shifting weight to LLE due to weakness with poor trunk control. Patient does fatigue requiring short seated rest breaks. He admits that he spends most of his day in the bed unless he is at the adult day center. PT recommended patient walk a lap around his home every time he has a meal to allow for  increased mobility in a way that is functional and reasonable. Patient and spouse agreed. He would benefit from additional skilled PT Intervention to improve strength, balance and gait safety; Plan to address goals next week.    Personal Factors and Comorbidities Age;Comorbidity 3+;Time since onset of injury/illness/exacerbation;Past/Current Experience    Examination-Activity Limitations Bathing;Bed Mobility;Squat;Stairs;Bend;Locomotion Level;Stand;Caring for Others;Reach Overhead;Transfers;Dressing    Examination-Participation Restrictions Church;Cleaning;Shop;Community Activity;Driving;Yard Work    Stability/Clinical Decision Making Unstable/Unpredictable    Rehab Potential Fair    PT Frequency 2x / week    PT Duration 12 weeks    PT Treatment/Interventions ADLs/Self Care Home Management;DME Instruction;Gait training;Stair training;Functional mobility training;Therapeutic activities;Therapeutic exercise;Balance training;Neuromuscular re-education;Patient/family education;Manual techniques;Passive range of motion;Energy conservation    PT Next Visit Plan Reassess orthostatics. Complete 10 m gait speed, Berg., if pt able    PT Home Exercise Plan Educated pt and spouse on seated ROM exerices to assist elevating BP.    Consulted and Agree with Plan of Care Patient;Family member/caregiver    Family Member Consulted spouse           Patient will benefit from skilled therapeutic intervention in order to improve the following deficits and impairments:  Abnormal gait,Improper body mechanics,Decreased mobility,Postural dysfunction,Decreased activity tolerance,Decreased endurance,Decreased range of motion,Decreased strength,Decreased balance,Decreased safety awareness,Difficulty walking  Visit Diagnosis: Muscle weakness (generalized)  Other abnormalities of gait and mobility  Unsteadiness on feet  Difficulty in walking, not elsewhere classified     Problem List There are no problems to  display for this patient.   Oron Westrup PT, DPT 09/23/2020, 10:52 AM  [place] [facility] MAIN [facility] SERVICES [address] [place], [place], [address] Phone: [phone]   Fax:  [phone]  Name: Chad  J Gilpin Sr. MRN: [id number] Date of Birth: [birth year]

## 2020-09-25 ENCOUNTER — Ambulatory Visit: Payer: Medicare Other

## 2020-09-25 ENCOUNTER — Other Ambulatory Visit: Payer: Self-pay

## 2020-09-25 DIAGNOSIS — R269 Unspecified abnormalities of gait and mobility: Secondary | ICD-10-CM

## 2020-09-25 DIAGNOSIS — R2681 Unsteadiness on feet: Secondary | ICD-10-CM | POA: Diagnosis not present

## 2020-09-25 DIAGNOSIS — M6281 Muscle weakness (generalized): Secondary | ICD-10-CM

## 2020-09-25 DIAGNOSIS — R262 Difficulty in walking, not elsewhere classified: Secondary | ICD-10-CM

## 2020-09-25 DIAGNOSIS — R278 Other lack of coordination: Secondary | ICD-10-CM

## 2020-09-25 NOTE — Therapy (Signed)
[place] [facility] MAIN [facility] SERVICES [address] [place], [place], [address] Phone: [phone]   Fax:  [phone]  Physical Therapy Treatment  Patient Details  Name: Chad Genrich Millican Sr. MRN: [id number] Date of Birth: 1934-01-24 Referring Provider (PT): Dr. Sampson Goon   Encounter Date: 09/25/2020   PT End of Session - 09/25/20 1627    Visit Number 9    Number of Visits 25    Date for PT Re-Evaluation 10/28/20    Authorization Type Eval: 4/5; 1/10 PN    Authorization - Visit Number 8    Authorization - Number of Visits 10    PT Start Time 0905    PT Stop Time 0930    PT Time Calculation (min) 25 min    Equipment Utilized During Treatment Gait belt    Activity Tolerance Patient tolerated treatment well    Behavior During Therapy WFL for tasks assessed/performed           Past Medical History:  Diagnosis Date  . Coronary artery disease   . Depression   . Diabetes mellitus without complication (HCC)   . Seizures (HCC)   . Stroke (HCC)   . Vascular dementia [facility])     Past Surgical History:  Procedure Laterality Date  . CARDIAC SURGERY      There were no vitals filed for this visit.   Subjective Assessment - 09/25/20 1618    Subjective Patient reports doing okay. Wife reports he has not been exercising too much or as much as he should. She reports he is using his walker.    Pertinent History Pt is a 87 y.o. male referred to PT for balance and gait due to recurrent falls. He presents with a PMH of: CAD, depression, DM, seizures, CVA, vascular dementia. 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 Pt relies heavily on spouse for history due to baseline cognitive defcits due to his vascular dementia. Hx of orthostatics due to poor liquid intake likely due to his vascular dementia in the past. Pt with spouse at eval and states his referral is for gait and imbalance. Pt and spouse reports on average he was having 1 fall/week approximately with  no specific reason for his falls. Pt relies on chair or bed to stand up from or calls to neighbors to assist in returning pt upright. Pt and spouse repotr pt did have significant fall at the end of january leading to a L shoulder fracture. Unable to specify if any weightbearing precautions. Denies falls at night in low light and a decrease of activity levels since not going to adult daycare due to covid 19 concerns. History of CVA that affected his L side with reported weakness which pt's spouse is curious if this could be affecting his balance. Spouse assists with ADL's such as dressing, bathing, cooking, cleaning. Pt and spouse reports pt spends most of his ambulation at the home inside. Pt's goal is to stop falling and improve his walking.    Limitations Lifting;Standing;Walking    How long can you sit comfortably? N/A    How long can you stand comfortably? a few minutes    How long can you walk comfortably? short household distances    Patient Stated Goals Stop falling, improve walking    Currently in Pain? No/denies         * Patient arrived at wrong time so treatment limited to 25 min   Interventions:   Gait: Patient ambulated around  160 feet x 1 and 175 feet using front wheeled walker, CGA, with short reciprocal gait- decreased left toe/foot clearance with 2 episodes of loss of balance, Min assist to recover. Instructed patient to make sure he is picking up his foot and to try to take a longer step. He exhibited poor hip flex/toe clearance that worsened with fatigue.  Standing at support bar with 1/2 white foam roll placed in front of him at the point he would achieve heel strike - Instructed him to swing his left LE to heel strike on the floor and allow toebox to land on foam then swing leg back to toe off position. Patient performed 15 reps on left and then on right with use of foam as feedback for improving heel strike with good results- Patient able to achieve effective swing through with  some heel strike.   Step tapping onto 1st step with 1 UE support 10 reps each LE- No loss of balance today.   Step training- Ascending/descending 4 steps with B rail - step through technique with mild difficulty with right LE yet improved with practice and VC for sequencing- Patient performed 3 trials with mild left  knee buckling with ascending.   Clinical Impression: Treatment was limited secondary to scheduling misunderstanding. However patient demonstrated good return of instruction with heel strike and with steps today- able to perform steps with CGA. He continues to demonstrated difficulty clearing left LE and remains at increased risk of falling. Continued to recommend using walker for all mobility to patient and discussed with his wife as well importance of compliance with use of walker to maximize his safety. Both verbalized understanding. He will benefit from continued skilled PT services to improve his overall functional strength and mobility for decreased fall risk and improved quality of life.                             PT Education - 09/25/20 1626    Education Details specific exercise technique.    Person(s) Educated Patient    Methods Explanation;Demonstration;Verbal cues;Tactile cues    Comprehension Verbalized understanding;Returned demonstration;Verbal cues required;Tactile cues required;Need further instruction            PT Short Term Goals - 08/05/20 1003      PT SHORT TERM GOAL #1   Title Pt will be independent with HEP to improve strength/mobility to improve independence with ADL's    Baseline 08/05/2020: Seated marches, long arc quads    Time 6    Period Weeks    Status New    Target Date 09/16/20             PT Long Term Goals - 08/05/20 1026      PT LONG TERM GOAL #1   Title Pt will improve 5xSTS to < 15 sec for clinically significant increase in LE strength and decreased risk of falls.    Baseline 4/5: 20.77 sec with RUE support  on chair rail    Time 12    Period Weeks    Status New    Target Date 10/28/20      PT LONG TERM GOAL #2   Title Pt will improve 10 m gait speed to > 0.8 m/s with LRAD to improve safety with household ambulation and decrease risk of falls.    Baseline 4/5: Deferred due to orthostasis    Time 12    Period Weeks    Status New  Target Date 10/28/20      PT LONG TERM GOAL #3   Title Pt will improve Berg balance by 8 points as evidenced for age norms to display clinically significant reduced risk of falls for improved independence with ADL completion and walking tasks.    Baseline 4/5: deferred due to orthostasis    Time 12    Period Weeks    Status New    Target Date 10/28/20      PT LONG TERM GOAL #4   Title Pt will improve FOTO to target score equal or greater than 62 to maximize capcacity to improve independence with mobility    Baseline 4/5: 59/62    Time 12    Period Weeks    Status New    Target Date 10/28/20                 Plan - 09/25/20 1629    Clinical Impression Statement Treatment was limited secondary to scheduling misunderstanding. However patient demonstrated good return of instruction with heel strike and with steps today- able to perform steps with CGA. He continues to demonstrated difficulty clearing left LE and remains at increased risk of falling. Continued to recommend using walker for all mobility to patient and discussed with his wife as well importance of compliance with use of walker to maximize his safety. Both verbalized understanding. He will benefit from continued skilled PT services to improve his overall functional strength and mobility for decreased fall risk and improved quality of life.    Personal Factors and Comorbidities Age;Comorbidity 3+;Time since onset of injury/illness/exacerbation;Past/Current Experience    Examination-Activity Limitations Bathing;Bed Mobility;Squat;Stairs;Bend;Locomotion Level;Stand;Caring for Others;Reach  Overhead;Transfers;Dressing    Examination-Participation Restrictions Church;Cleaning;Shop;Community Activity;Driving;Yard Work    Stability/Clinical Decision Making Unstable/Unpredictable    Rehab Potential Fair    PT Frequency 2x / week    PT Duration 12 weeks    PT Treatment/Interventions ADLs/Self Care Home Management;DME Instruction;Gait training;Stair training;Functional mobility training;Therapeutic activities;Therapeutic exercise;Balance training;Neuromuscular re-education;Patient/family education;Manual techniques;Passive range of motion;Energy conservation    PT Next Visit Plan Reassess orthostatics. Complete 10 m gait speed, Berg., if pt able    PT Home Exercise Plan Educated pt and spouse on seated ROM exerices to assist elevating BP.    Consulted and Agree with Plan of Care Patient;Family member/caregiver    Family Member Consulted spouse           Patient will benefit from skilled therapeutic intervention in order to improve the following deficits and impairments:  Abnormal gait,Improper body mechanics,Decreased mobility,Postural dysfunction,Decreased activity tolerance,Decreased endurance,Decreased range of motion,Decreased strength,Decreased balance,Decreased safety awareness,Difficulty walking  Visit Diagnosis: Abnormality of gait and mobility  Difficulty in walking, not elsewhere classified  Muscle weakness (generalized)  Other lack of coordination     Problem List There are no problems to display for this patient.   Lenda Kelp, PT 09/25/2020, 4:55 PM  [place] [facility] MAIN [facility] SERVICES [address] [place], [place], [address] Phone: [phone]   Fax:  [phone]  Name: Conlan Miceli [facility] Sr. MRN: [id number] Date of Birth: 1933-07-02

## 2020-09-30 ENCOUNTER — Ambulatory Visit: Payer: Medicare Other | Admitting: Physical Therapy

## 2020-10-02 ENCOUNTER — Other Ambulatory Visit: Payer: Self-pay

## 2020-10-02 ENCOUNTER — Ambulatory Visit: Payer: Medicare Other | Attending: Infectious Diseases

## 2020-10-02 DIAGNOSIS — R2681 Unsteadiness on feet: Secondary | ICD-10-CM | POA: Diagnosis present

## 2020-10-02 DIAGNOSIS — R262 Difficulty in walking, not elsewhere classified: Secondary | ICD-10-CM | POA: Diagnosis present

## 2020-10-02 DIAGNOSIS — R269 Unspecified abnormalities of gait and mobility: Secondary | ICD-10-CM | POA: Diagnosis present

## 2020-10-02 DIAGNOSIS — M6281 Muscle weakness (generalized): Secondary | ICD-10-CM | POA: Insufficient documentation

## 2020-10-02 DIAGNOSIS — R2689 Other abnormalities of gait and mobility: Secondary | ICD-10-CM

## 2020-10-02 DIAGNOSIS — R278 Other lack of coordination: Secondary | ICD-10-CM | POA: Diagnosis present

## 2020-10-02 NOTE — Therapy (Signed)
[facility] MAIN [facility] SERVICES [address] [place], [place], [address] Phone: [phone]   Fax:  [phone]  Physical Therapy Treatment/Physical Therapy Progress Note   Dates of reporting period  08/05/2020  to  10/02/2020   Patient Details  Name: Chad Arrona Keener Sr. MRN: [id number] Date of Birth: Apr 29, 1934 Referring Provider (PT): Dr. Sampson Goon   Encounter Date: 10/02/2020   PT End of Session - 10/03/20 1216    Visit Number 10    Number of Visits 25    Date for PT Re-Evaluation 10/28/20    Authorization Type Eval: 4/5; 1/10 PN    Authorization - Visit Number 8    Authorization - Number of Visits 10    PT Start Time 0847    PT Stop Time 0928    PT Time Calculation (min) 41 min    Equipment Utilized During Treatment Gait belt    Activity Tolerance Patient tolerated treatment well    Behavior During Therapy WFL for tasks assessed/performed           Past Medical History:  Diagnosis Date  . Coronary artery disease   . Depression   . Diabetes mellitus without complication (HCC)   . Seizures (HCC)   . Stroke (HCC)   . Vascular dementia [facility])     Past Surgical History:  Procedure Laterality Date  . CARDIAC SURGERY      There were no vitals filed for this visit.   Subjective Assessment - 10/03/20 1215    Subjective Pt with spouse. Pt started daycare first of last month, which hsa been going well. Pt reports no falls, no pain.    Pertinent History Pt is a 87 y.o. male referred to PT for balance and gait due to recurrent falls. He presents with a PMH of: CAD, depression, DM, seizures, CVA, vascular dementia. 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 Pt relies heavily on spouse for history due to baseline cognitive defcits due to his vascular dementia. Hx of orthostatics due to poor liquid intake likely due to his vascular dementia in the past. Pt with spouse at eval and states his referral is for gait and imbalance. Pt and spouse  reports on average he was having 1 fall/week approximately with no specific reason for his falls. Pt relies on chair or bed to stand up from or calls to neighbors to assist in returning pt upright. Pt and spouse repotr pt did have significant fall at the end of january leading to a L shoulder fracture. Unable to specify if any weightbearing precautions. Denies falls at night in low light and a decrease of activity levels since not going to adult daycare due to covid 19 concerns. History of CVA that affected his L side with reported weakness which pt's spouse is curious if this could be affecting his balance. Spouse assists with ADL's such as dressing, bathing, cooking, cleaning. Pt and spouse reports pt spends most of his ambulation at the home inside. Pt's goal is to stop falling and improve his walking.    Limitations Lifting;Standing;Walking    How long can you sit comfortably? N/A    How long can you stand comfortably? a few minutes    How long can you walk comfortably? short household distances    Patient Stated Goals Stop falling, improve walking    Currently in Pain? No/denies          TREATMENT:  Retesting and review of goals for progress  note.   Therex: HEP: went through 10 reps of each exercise assigned for HEP as pt not yet indep: seated marches, LAQ.  5xsts: Warm up (trial 1) 33.75 sec; further instruction provided regarding purpose of test/technique Second trial 18.25 sec (improved)  FOTO: 55 (slight decrease)  : deferred d/t time   Neuro: Sharlene Motts 37/56  Patient's condition has the potential to improve in response to therapy. Maximum improvement is yet to be obtained. The anticipated improvement is attainable and reasonable in a generally predictable time.     Pt educated throughout session about proper posture and technique with exercises and testing. Improved exercise technique, movement at target joints, use of target muscles after min to mod verbal, visual, tactile  cues   PT Education - 10/03/20 1216    Education Details reassessment findings, progress, indications for PT    Person(s) Educated Patient;Spouse    Methods Explanation    Comprehension Verbalized understanding            PT Short Term Goals - 10/02/20 0852      PT SHORT TERM GOAL #1   Title Pt will be independent with HEP to improve strength/mobility to improve independence with ADL's    Baseline 08/05/2020: Seated marches, long arc quads; 6/2:  reviewed HEP as pt not yet indep    Time 6    Period Weeks    Status On-going    Target Date 11/13/20             PT Long Term Goals - 10/02/20 0855      PT LONG TERM GOAL #1   Title Pt will improve 5xSTS to < 15 sec for clinically significant increase in LE strength and decreased risk of falls.    Baseline 4/5: 20.77 sec with RUE support on chair rail; 6/1: 18.25 sec    Time 12    Period Weeks    Status New    Target Date 10/28/20      PT LONG TERM GOAL #2   Title Pt will improve 10 m gait speed to > 0.8 m/s with LRAD to improve safety with household ambulation and decrease risk of falls.    Baseline 4/5: Deferred due to orthostasis    Time 12    Period Weeks    Status New      PT LONG TERM GOAL #3   Title Pt will improve Berg balance by 8 points as evidenced for age norms to display clinically significant reduced risk of falls for improved independence with ADL completion and walking tasks.    Baseline 4/5: deferred due to orthostasis; 6/1: 37/56    Time 12    Period Weeks    Status On-going    Target Date 10/28/20      PT LONG TERM GOAL #4   Title Pt will improve FOTO to target score equal or greater than 62 to maximize capcacity to improve independence with mobility    Baseline 4/5: 59/62; 6/1: 55    Time 12    Period Weeks    Status On-going    Target Date 10/28/20                 Plan - 10/03/20 1219    Clinical Impression Statement Goals retested for progress note. Pt shows improvement on 5xSTS  (18.25 sec) indicating increased BLE power and slight decrease in fall risk. However, pt Berg score 37/56 still indicates pt is at risk for falls. Pt with slight decrease in  FOTO score (55). HEP reviewed with pt and spouse in order to improve compliance with HEP and to increase gains made in PT. deferred d/t time. Patient's condition has the potential to improve in response to therapy. Maximum improvement is yet to be obtained. The anticipated improvement is attainable and reasonable in a generally predictable time. The pt will benefit from further skilled PT to improve overall functional strength and mobility and to decrease fall risk and increase QOL.    Personal Factors and Comorbidities Age;Comorbidity 3+;Time since onset of injury/illness/exacerbation;Past/Current Experience    Examination-Activity Limitations Bathing;Bed Mobility;Squat;Stairs;Bend;Locomotion Level;Stand;Caring for Others;Reach Overhead;Transfers;Dressing    Examination-Participation Restrictions Church;Cleaning;Shop;Community Activity;Driving;Yard Work    Stability/Clinical Decision Making Unstable/Unpredictable    Rehab Potential Fair    PT Frequency 2x / week    PT Duration 12 weeks    PT Treatment/Interventions ADLs/Self Care Home Management;DME Instruction;Gait training;Stair training;Functional mobility training;Therapeutic activities;Therapeutic exercise;Balance training;Neuromuscular re-education;Patient/family education;Manual techniques;Passive range of motion;Energy conservation    PT Next Visit Plan Reassess orthostatics. Complete 10 m gait speed, Berg., if pt able    PT Home Exercise Plan Educated pt and spouse on seated ROM exerices to assist elevating BP.    Consulted and Agree with Plan of Care Patient;Family member/caregiver    Family Member Consulted spouse           Patient will benefit from skilled therapeutic intervention in order to improve the following deficits and impairments:  Abnormal  gait,Improper body mechanics,Decreased mobility,Postural dysfunction,Decreased activity tolerance,Decreased endurance,Decreased range of motion,Decreased strength,Decreased balance,Decreased safety awareness,Difficulty walking  Visit Diagnosis: Unsteadiness on feet  Other abnormalities of gait and mobility  Muscle weakness (generalized)     Problem List There are no problems to display for this patient.  Temple Pacini PT, DPT 10/03/2020, 12:25 PM  [facility] MAIN [facility] SERVICES [address] [place], [place], [address] Phone: [phone]   Fax:  [phone]  Name: Chad Morillo [facility] Sr. MRN: [id number] Date of Birth: 1933-05-16

## 2020-10-07 ENCOUNTER — Ambulatory Visit: Payer: Medicare Other

## 2020-10-07 ENCOUNTER — Other Ambulatory Visit: Payer: Self-pay

## 2020-10-07 DIAGNOSIS — M6281 Muscle weakness (generalized): Secondary | ICD-10-CM

## 2020-10-07 DIAGNOSIS — R262 Difficulty in walking, not elsewhere classified: Secondary | ICD-10-CM

## 2020-10-07 DIAGNOSIS — R2681 Unsteadiness on feet: Secondary | ICD-10-CM | POA: Diagnosis not present

## 2020-10-07 DIAGNOSIS — R278 Other lack of coordination: Secondary | ICD-10-CM

## 2020-10-07 DIAGNOSIS — R269 Unspecified abnormalities of gait and mobility: Secondary | ICD-10-CM

## 2020-10-07 NOTE — Therapy (Signed)
[facility] MAIN [facility] SERVICES [address] [place], [place], [address] Phone: [phone]   Fax:  [phone]  Physical Therapy Treatment  Patient Details  Name: Chad Sayas Dada Sr. MRN: [id number] Date of Birth: 1933-06-20 Referring Provider (PT): Dr. Sampson Goon   Encounter Date: 10/07/2020   PT End of Session - 10/07/20 0851    Visit Number 11    Number of Visits 25    Date for PT Re-Evaluation 10/28/20    Authorization Type Eval: 4/5; 1/10 PN    Authorization - Visit Number 9    Authorization - Number of Visits 10    PT Start Time 0845    PT Stop Time 0928    PT Time Calculation (min) 43 min    Equipment Utilized During Treatment Gait belt    Activity Tolerance Patient tolerated treatment well    Behavior During Therapy WFL for tasks assessed/performed           Past Medical History:  Diagnosis Date  . Coronary artery disease   . Depression   . Diabetes mellitus without complication (HCC)   . Seizures (HCC)   . Stroke (HCC)   . Vascular dementia [facility])     Past Surgical History:  Procedure Laterality Date  . CARDIAC SURGERY      There were no vitals filed for this visit.   Subjective Assessment - 10/07/20 0849    Subjective Patient and wife report no new issues. Patient reports doing okay with no report of any falls or any pain.    Pertinent History Pt is a 87 y.o. male referred to PT for balance and gait due to recurrent falls. He presents with a PMH of: CAD, depression, DM, seizures, CVA, vascular dementia. 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 Pt relies heavily on spouse for history due to baseline cognitive defcits due to his vascular dementia. Hx of orthostatics due to poor liquid intake likely due to his vascular dementia in the past. Pt with spouse at eval and states his referral is for gait and imbalance. Pt and spouse reports on average he was having 1 fall/week approximately with no specific reason for his  falls. Pt relies on chair or bed to stand up from or calls to neighbors to assist in returning pt upright. Pt and spouse repotr pt did have significant fall at the end of january leading to a L shoulder fracture. Unable to specify if any weightbearing precautions. Denies falls at night in low light and a decrease of activity levels since not going to adult daycare due to covid 19 concerns. History of CVA that affected his L side with reported weakness which pt's spouse is curious if this could be affecting his balance. Spouse assists with ADL's such as dressing, bathing, cooking, cleaning. Pt and spouse reports pt spends most of his ambulation at the home inside. Pt's goal is to stop falling and improve his walking.    Limitations Lifting;Standing;Walking    How long can you sit comfortably? N/A    How long can you stand comfortably? a few minutes    How long can you walk comfortably? short household distances    Patient Stated Goals Stop falling, improve walking    Currently in Pain? No/denies            Nustep L2 for Total distance = 0.14 mi- VC to keep SPM >50  BP= 98/60 mmHg Left UE sitting  10 MWT=  0.62 m/s  Gait sequencing training: in // bars using  1/2 white foam roll placed in front of him at the point he would achieve heel strike - Instructed him to swing his left LE to heel strike on the floor and allow toebox to land on foam then swing leg back to toe off position. Patient performed 15 reps on left and then on right with use of foam as feedback for improving heel strike with good results- Patient able to achieve effective swing through with some heel strike.     Seated active ankle DF: 2 sets of 15 reps. Patient with difficulty with left LE yet able to perform partial ROM- reviewed importance of performing as part of his HEP with patient and wife today.   Resistive seated hip flex 4lb. 2 sets of 10 reps with brief rest break- VC to perform with as much ROM as possible.     Resistive LAQ 4lb. -2 sets of 10 reps- VC to perform on left LE with max ROM- using PT hand as a goal height- patient responded well and able to improve with terminal knee ext through full ROM.   Clinical Impression: Patient able to follow cues and performed well with repetitive verbal cues and feedback. He was able to improve with increased sets of seated therex today and increased resistance. He lacks functional heel strike yes does endorse adequate strength in ankle to perform and will benefit from continued training to improve his overall gait quality and to decrease fall risk.                          PT Education - 10/07/20 0850    Education Details Specific exercise form    Person(s) Educated Patient    Methods Explanation;Demonstration;Tactile cues;Verbal cues    Comprehension Verbalized understanding;Returned demonstration;Verbal cues required;Tactile cues required;Need further instruction            PT Short Term Goals - 10/07/20 0900      PT SHORT TERM GOAL #1   Title Pt will be independent with HEP to improve strength/mobility to improve independence with ADL's    Baseline 08/05/2020: Seated marches, long arc quads; 6/2:  reviewed HEP as pt not yet indep    Time 6    Period Weeks    Status On-going    Target Date 11/13/20             PT Long Term Goals - 10/02/20 0855      PT LONG TERM GOAL #1   Title Pt will improve 5xSTS to < 15 sec for clinically significant increase in LE strength and decreased risk of falls.    Baseline 4/5: 20.77 sec with RUE support on chair rail; 6/1: 18.25 sec    Time 12    Period Weeks    Status New    Target Date 10/28/20      PT LONG TERM GOAL #2   Title Pt will improve 10 m gait speed to > 0.8 m/s with LRAD to improve safety with household ambulation and decrease risk of falls.    Baseline 4/5: Deferred due to orthostasis    Time 12    Period Weeks    Status New      PT LONG TERM GOAL #3   Title Pt will  improve Berg balance by 8 points as evidenced for age norms to display clinically significant reduced risk of falls for improved independence with ADL  completion and walking tasks.    Baseline 4/5: deferred due to orthostasis; 6/1: 37/56    Time 12    Period Weeks    Status On-going    Target Date 10/28/20      PT LONG TERM GOAL #4   Title Pt will improve FOTO to target score equal or greater than 62 to maximize capcacity to improve independence with mobility    Baseline 4/5: 59/62; 6/1: 55    Time 12    Period Weeks    Status On-going    Target Date 10/28/20                 Plan - 10/07/20 0851    Clinical Impression Statement Patient able to follow cues and performed well with repetitive verbal cues and feedback. He was able to improve with increased sets of seated therex today and increased resistance. He lacks functional heel strike yes does endorse adequate strength in ankle to perform and will benefit from continued training to improve his overall gait quality and to decrease fall risk.    Personal Factors and Comorbidities Age;Comorbidity 3+;Time since onset of injury/illness/exacerbation;Past/Current Experience    Examination-Activity Limitations Bathing;Bed Mobility;Squat;Stairs;Bend;Locomotion Level;Stand;Caring for Others;Reach Overhead;Transfers;Dressing    Examination-Participation Restrictions Church;Cleaning;Shop;Community Activity;Driving;Yard Work    Stability/Clinical Decision Making Unstable/Unpredictable    Rehab Potential Fair    PT Frequency 2x / week    PT Duration 12 weeks    PT Treatment/Interventions ADLs/Self Care Home Management;DME Instruction;Gait training;Stair training;Functional mobility training;Therapeutic activities;Therapeutic exercise;Balance training;Neuromuscular re-education;Patient/family education;Manual techniques;Passive range of motion;Energy conservation    PT Next Visit Plan Reassess orthostatics. Complete 10 m gait speed, Berg.,  if pt able    PT Home Exercise Plan Educated pt and spouse on seated ROM exerices including importance of ankle DF.    Consulted and Agree with Plan of Care Patient;Family member/caregiver    Family Member Consulted spouse           Patient will benefit from skilled therapeutic intervention in order to improve the following deficits and impairments:  Abnormal gait,Improper body mechanics,Decreased mobility,Postural dysfunction,Decreased activity tolerance,Decreased endurance,Decreased range of motion,Decreased strength,Decreased balance,Decreased safety awareness,Difficulty walking  Visit Diagnosis: Abnormality of gait and mobility  Difficulty in walking, not elsewhere classified  Muscle weakness (generalized)  Other lack of coordination     Problem List There are no problems to display for this patient.   Lenda Kelp, PT 10/07/2020, 5:07 PM  [facility] MAIN [facility] SERVICES [address] [place], [place], [address] Phone: [phone]   Fax:  [phone]  Name: Chad [name] [facility] Sr. MRN: [id number] Date of Birth: Jun 25, 1933

## 2020-10-09 ENCOUNTER — Ambulatory Visit: Payer: Medicare Other | Admitting: Physical Therapy

## 2020-10-09 ENCOUNTER — Encounter: Payer: Self-pay | Admitting: Physical Therapy

## 2020-10-09 ENCOUNTER — Other Ambulatory Visit: Payer: Self-pay

## 2020-10-09 DIAGNOSIS — R262 Difficulty in walking, not elsewhere classified: Secondary | ICD-10-CM

## 2020-10-09 DIAGNOSIS — R2681 Unsteadiness on feet: Secondary | ICD-10-CM | POA: Diagnosis not present

## 2020-10-09 DIAGNOSIS — M6281 Muscle weakness (generalized): Secondary | ICD-10-CM

## 2020-10-09 DIAGNOSIS — R278 Other lack of coordination: Secondary | ICD-10-CM

## 2020-10-09 DIAGNOSIS — R269 Unspecified abnormalities of gait and mobility: Secondary | ICD-10-CM

## 2020-10-09 DIAGNOSIS — R2689 Other abnormalities of gait and mobility: Secondary | ICD-10-CM

## 2020-10-09 NOTE — Therapy (Signed)
[facility] MAIN [facility] SERVICES [address] [place], [place], [address] Phone: [phone]   Fax:  [phone]  Physical Therapy Treatment  Patient Details  Name: Chad Caamano Guier Sr. MRN: [id number] Date of Birth: January 04, 1934 Referring Provider (PT): Dr. Sampson Goon   Encounter Date: 10/09/2020   PT End of Session - 10/09/20 0810     Visit Number 12    Number of Visits 25    Date for PT Re-Evaluation 10/28/20    Authorization Type Eval: 4/5; 1/10 PN    Authorization - Visit Number 2    Authorization - Number of Visits 10    PT Start Time 0802    PT Stop Time 0845    PT Time Calculation (min) 43 min    Equipment Utilized During Treatment Gait belt    Activity Tolerance Patient tolerated treatment well    Behavior During Therapy WFL for tasks assessed/performed             Past Medical History:  Diagnosis Date   Coronary artery disease    Depression    Diabetes mellitus without complication (HCC)    Seizures (HCC)    Stroke (HCC)    Vascular dementia (HCC)     Past Surgical History:  Procedure Laterality Date   CARDIAC SURGERY      There were no vitals filed for this visit.   Subjective Assessment - 10/09/20 0809     Subjective Patient and wife report no new issues. Patient reports doing okay with no pain; His wife reports its been about a month or more since he last fall.    Pertinent History Pt is a 87 y.o. male referred to PT for balance and gait due to recurrent falls. He presents with a PMH of: CAD, depression, DM, seizures, CVA, vascular dementia. 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 Pt relies heavily on spouse for history due to baseline cognitive defcits due to his vascular dementia. Hx of orthostatics due to poor liquid intake likely due to his vascular dementia in the past. Pt with spouse at eval and states his referral is for gait and imbalance. Pt and spouse reports on average he was having 1 fall/week  approximately with no specific reason for his falls. Pt relies on chair or bed to stand up from or calls to neighbors to assist in returning pt upright. Pt and spouse repotr pt did have significant fall at the end of january leading to a L shoulder fracture. Unable to specify if any weightbearing precautions. Denies falls at night in low light and a decrease of activity levels since not going to adult daycare due to covid 19 concerns. History of CVA that affected his L side with reported weakness which pt's spouse is curious if this could be affecting his balance. Spouse assists with ADL's such as dressing, bathing, cooking, cleaning. Pt and spouse reports pt spends most of his ambulation at the home inside. Pt's goal is to stop falling and improve his walking.    Limitations Lifting;Standing;Walking    How long can you sit comfortably? N/A    How long can you stand comfortably? a few minutes    How long can you walk comfortably? short household distances    Patient Stated Goals Stop falling, improve walking    Currently in Pain? No/denies    Multiple Pain Sites No                TREATMENT:  He reports  not walking much at home and rarely doing HEP. Reinforced importance of walking more at home to help with endurance and to help with BP levels; Patient/caregiver also educated in importance of drinking more water to help reduce risk of dehydration;     Instructed patient in LE strengthening exercise: Standing in parallel bars:  With 2# ankle weight on BLE:  -hip flexion march x15 reps x1 sets with 1 rail assist with cues for erect posture and increased ROM to tolerance; -BLE heel raises x15 reps x1 sets with 2 HHA and cues for knee extension for better calf strengthening; -Hip abduction x15 reps x1 sets -Forward/backward walking x2 laps with 2 HHA and cues for increased foot clearance; -side stepping x2 laps each direction; Patient required min VCs to keep feet oriented forward for better  hip abductor strengthening;   Stepping over orange hurdle with 2# ankle weight: Forward/backward x10 reps with 1-2 rail assist  Side stepping over hurdle x5 reps each direction Required mod Vcs for sequencing and proper positioning. Pt exhibits decreased weight shift to LLE having a harder time clearing hurdle.    Sit<>Stand with1  HHA x10 reps with cues for forward weight shift to improve transfer ability;   Patient required min-mod VCs for proper positioning including to improve erect posture for better hip/LE strengthening and improved motor activation;  He has increased difficulty with weight bearing through LLE often leaning forward and collapsing on left hip with trendelenburg positioning;     BP after exercise, 103/68   Following exercise, patient instructed in gait with RW, x80 feet x1 sets with CGA for safety with slight improvement in weight bearing on LLE but patient continues to have uneven cadence with short stance on LLE and decreased step length on RLE with antalgic like gait pattern due to weakness in LLE; Patient also exhibits increased difficulty turning with walker getting too far out in front requiring min A for balance recovery;      Gait weaving around cones #4 x2 reps with min A and mod VCs for walker placement and sequencing. Patient often lets walker get too far in front of him making turning and cone negotiation difficult.   BP after walking 103/63;  Patient would benefit from additional gait training with RW to improve awareness and assist patient with remembering to use it.   Patient tolerated session fair. He reports increased fatigue in LLE and difficulty with prolonged standing/walking. Patient required min-mod VCs for erect posture and weight shift for better stance control.    He denies any pain at end of session. He does report increased fatigue at end of session.  Reinforced importance of activity at home as that does seem to help with vitals and will  improve cardiovascular endurance;                            PT Education - 10/09/20 0810     Education Details exercise, gait safety, HEP    Person(s) Educated Patient    Methods Explanation;Verbal cues    Comprehension Verbalized understanding;Returned demonstration;Verbal cues required;Need further instruction              PT Short Term Goals - 10/07/20 0900       PT SHORT TERM GOAL #1   Title Pt will be independent with HEP to improve strength/mobility to improve independence with ADL's    Baseline 08/05/2020: Seated marches, long arc quads; 6/2:  reviewed HEP as pt not yet indep    Time 6    Period Weeks    Status On-going    Target Date 11/13/20               PT Long Term Goals - 10/02/20 0855       PT LONG TERM GOAL #1   Title Pt will improve 5xSTS to < 15 sec for clinically significant increase in LE strength and decreased risk of falls.    Baseline 4/5: 20.77 sec with RUE support on chair rail; 6/1: 18.25 sec    Time 12    Period Weeks    Status New    Target Date 10/28/20      PT LONG TERM GOAL #2   Title Pt will improve 10 m gait speed to > 0.8 m/s with LRAD to improve safety with household ambulation and decrease risk of falls.    Baseline 4/5: Deferred due to orthostasis    Time 12    Period Weeks    Status New      PT LONG TERM GOAL #3   Title Pt will improve Berg balance by 8 points as evidenced for age norms to display clinically significant reduced risk of falls for improved independence with ADL completion and walking tasks.    Baseline 4/5: deferred due to orthostasis; 6/1: 37/56    Time 12    Period Weeks    Status On-going    Target Date 10/28/20      PT LONG TERM GOAL #4   Title Pt will improve FOTO to target score equal or greater than 62 to maximize capcacity to improve independence with mobility    Baseline 4/5: 59/62; 6/1: 55    Time 12    Period Weeks    Status On-going    Target Date 10/28/20                    Plan - 10/09/20 0819     Clinical Impression Statement Patient instructed in advanced LE strengthening exercise. He does require min Vcs for proper positioning and exercise technique. Patient continues to have weakness on LLE often leaning to RLE. Patient exhibits improvement in BP to WNL following exercise. Instructed patient and caregiver in importance of regular activity at home. Patient often sleeps and lays in the bed throughout the day. Patient does fatigue quickly. He would benefit from additional skilled PT Intervention to improve strength, balance and gait safety;    Personal Factors and Comorbidities Age;Comorbidity 3+;Time since onset of injury/illness/exacerbation;Past/Current Experience    Examination-Activity Limitations Bathing;Bed Mobility;Squat;Stairs;Bend;Locomotion Level;Stand;Caring for Others;Reach Overhead;Transfers;Dressing    Examination-Participation Restrictions Church;Cleaning;Shop;Community Activity;Driving;Yard Work    Stability/Clinical Decision Making Unstable/Unpredictable    Rehab Potential Fair    PT Frequency 2x / week    PT Duration 12 weeks    PT Treatment/Interventions ADLs/Self Care Home Management;DME Instruction;Gait training;Stair training;Functional mobility training;Therapeutic activities;Therapeutic exercise;Balance training;Neuromuscular re-education;Patient/family education;Manual techniques;Passive range of motion;Energy conservation    PT Next Visit Plan Reassess orthostatics. Complete 10 m gait speed, Berg., 10mwt if pt able    PT Home Exercise Plan Educated pt and spouse on seated ROM exerices including importance of ankle DF.    Consulted and Agree with Plan of Care Patient;Family member/caregiver    Family Member Consulted spouse             Patient will benefit from skilled therapeutic intervention in order to improve the following deficits and impairments:  Abnormal  gait, Improper body mechanics, Decreased mobility,  Postural dysfunction, Decreased activity tolerance, Decreased endurance, Decreased range of motion, Decreased strength, Decreased balance, Decreased safety awareness, Difficulty walking  Visit Diagnosis: Abnormality of gait and mobility  Difficulty in walking, not elsewhere classified  Muscle weakness (generalized)  Other lack of coordination  Unsteadiness on feet  Other abnormalities of gait and mobility     Problem List There are no problems to display for this patient.   Imaad Reuss PT, DPT 10/09/2020, 8:36 AM  [facility] MAIN [facility] SERVICES [address] [place], [place], [address] Phone: [phone]   Fax:  [phone]  Name: Maika Kaczmarek [facility] Sr. MRN: [id number] Date of Birth: [birth year]

## 2020-10-13 ENCOUNTER — Ambulatory Visit: Payer: Medicare Other | Admitting: Physical Therapy

## 2020-10-16 ENCOUNTER — Other Ambulatory Visit: Payer: Self-pay

## 2020-10-16 ENCOUNTER — Ambulatory Visit: Payer: Medicare Other | Admitting: Physical Therapy

## 2020-10-16 ENCOUNTER — Encounter: Payer: Self-pay | Admitting: Physical Therapy

## 2020-10-16 DIAGNOSIS — R269 Unspecified abnormalities of gait and mobility: Secondary | ICD-10-CM

## 2020-10-16 DIAGNOSIS — R2681 Unsteadiness on feet: Secondary | ICD-10-CM | POA: Diagnosis not present

## 2020-10-16 DIAGNOSIS — R278 Other lack of coordination: Secondary | ICD-10-CM

## 2020-10-16 DIAGNOSIS — M6281 Muscle weakness (generalized): Secondary | ICD-10-CM

## 2020-10-16 DIAGNOSIS — R2689 Other abnormalities of gait and mobility: Secondary | ICD-10-CM

## 2020-10-16 DIAGNOSIS — R262 Difficulty in walking, not elsewhere classified: Secondary | ICD-10-CM

## 2020-10-16 NOTE — Therapy (Signed)
[facility] MAIN [facility] [address] , [place], [address] Phone: [phone]   Fax:  [phone]  Physical Therapy Treatment  Patient Details  Name: Chad Patteelva J Michelle Sr. MRN: [id number] Date of Birth: 11-29-1933 Referring Provider (PT): Dr. Sampson GoonFitzgerald   Encounter Date: 10/16/2020   PT End of Session - 10/16/20 0951     Visit Number 13    Number of Visits 25    Date for PT Re-Evaluation 10/28/20    Authorization Type Eval: 4/5; 1/10 PN    Authorization - Visit Number 3    Authorization - Number of Visits 10    PT Start Time 0805    PT Stop Time 0845    PT Time Calculation (min) 40 min    Equipment Utilized During Treatment Gait belt    Activity Tolerance Patient tolerated treatment well    Behavior During Therapy WFL for tasks assessed/performed             Past Medical History:  Diagnosis Date   Coronary artery disease    Depression    Diabetes mellitus without complication (HCC)    Seizures (HCC)    Stroke (HCC)    Vascular dementia (HCC)     Past Surgical History:  Procedure Laterality Date   CARDIAC SURGERY      There were no vitals filed for this visit.   Subjective Assessment - 10/16/20 0948     Subjective Patient and wife report no new issues. Patient reports doing okay with no pain;He reports he hasn't been doing much activity. Isn't sure why he doesn't do more activity.    Pertinent History Pt is a 87 y.o. male referred to PT for balance and gait due to recurrent falls. He presents with a PMH of: CAD, depression, DM, seizures, CVA, vascular dementia. 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 Pt relies heavily on spouse for history due to baseline cognitive defcits due to his vascular dementia. Hx of orthostatics due to poor liquid intake likely due to his vascular dementia in the past. Pt with spouse at eval and states his referral is for gait and imbalance. Pt and spouse reports on average he was  having 1 fall/week approximately with no specific reason for his falls. Pt relies on chair or bed to stand up from or calls to neighbors to assist in returning pt upright. Pt and spouse repotr pt did have significant fall at the end of january leading to a L shoulder fracture. Unable to specify if any weightbearing precautions. Denies falls at night in low light and a decrease of activity levels since not going to adult daycare due to covid 19 concerns. History of CVA that affected his L side with reported weakness which pt's spouse is curious if this could be affecting his balance. Spouse assists with ADL's such as dressing, bathing, cooking, cleaning. Pt and spouse reports pt spends most of his ambulation at the home inside. Pt's goal is to stop falling and improve his walking.    Limitations Lifting;Standing;Walking    How long can you sit comfortably? N/A    How long can you stand comfortably? a few minutes    How long can you walk comfortably? short household distances    Patient Stated Goals Stop falling, improve walking    Currently in Pain? No/denies    Multiple Pain Sites No  TREATMENT: He reports  not walking much at home and rarely doing HEP. Reinforced importance of walking more at home to help with endurance and to help with BP levels; Patient/caregiver also educated in importance of drinking more water to help reduce risk of dehydration;   Warm up on Nustep BUE/BLE level 2 x4 min (Unbilled)  Gait around gym x160 feet with RW with min A for balance safety. Patient does have episodes of instability often dragging LLE foot. He required cues to increase hip flexion and ankle DF for better foot clearance.   While walking patient does report soreness in LLE  Patient transitioned to supine PT performed SLR hamstring stretches passive 30 sec hold x2 reps Progressed to passive SLR hamstring stretch with ankle DF/PF neural flossing x10 reps;  Instructed  patient in bridges x15 reps with moderate difficulty reported; He required min A to transition to sitting;    Instructed patient in LE strengthening exercise: Standing in parallel bars:  With 2# ankle weight on BLE:  -hip flexion march x15 reps x1 sets with 1 rail assist with cues for erect posture and increased ROM to tolerance; -BLE heel raises x15 reps x1 sets with 2 HHA and cues for knee extension for better calf strengthening; -side stepping x3 laps each direction; Patient required min VCs to keep feet oriented forward for better hip abductor strengthening;  -Forward step ups to 6 inch step with 2 rail assist x10 reps -mini squat x15 reps with mod VCs for proper positioning and weight shift for BLE strengthening;     Patient required min-mod VCs for proper positioning including to improve erect posture for better hip/LE strengthening and improved motor activation;  He has increased difficulty with weight bearing through LLE often leaning forward and collapsing on left hip with trendelenburg positioning;     Following exercise, patient instructed in gait with RW, x60 feet x1 sets with CGA for safety with slight improvement in weight bearing on LLE but patient continues to have uneven cadence with short stance on LLE and decreased step length on RLE with antalgic like gait pattern due to weakness in LLE; Patient also exhibits increased difficulty turning with walker getting too far out in front requiring min A for balance recovery;      Patient would benefit from additional gait training with RW to improve awareness and assist patient with remembering to use it.   Patient tolerated session fair. He reports increased fatigue in LLE and difficulty with prolonged standing/walking. Patient required min-mod VCs for erect posture and weight shift for better stance control.    He denies any pain at end of session. He does report increased fatigue at end of session.  Reinforced importance of activity  at home as that does seem to help with vitals and will improve cardiovascular endurance;                   PT Education - 10/16/20 0950     Education Details exercise, gait safety; HEP    Person(s) Educated Patient    Methods Explanation;Verbal cues    Comprehension Verbalized understanding;Returned demonstration;Verbal cues required;Need further instruction              PT Short Term Goals - 10/07/20 0900       PT SHORT TERM GOAL #1   Title Pt will be independent with HEP to improve strength/mobility to improve independence with ADL's    Baseline 08/05/2020: Seated marches, long arc quads; 6/2:  reviewed  HEP as pt not yet indep    Time 6    Period Weeks    Status On-going    Target Date 11/13/20               PT Long Term Goals - 10/02/20 0855       PT LONG TERM GOAL #1   Title Pt will improve 5xSTS to < 15 sec for clinically significant increase in LE strength and decreased risk of falls.    Baseline 4/5: 20.77 sec with RUE support on chair rail; 6/1: 18.25 sec    Time 12    Period Weeks    Status New    Target Date 10/28/20      PT LONG TERM GOAL #2   Title Pt will improve 10 m gait speed to > 0.8 m/s with LRAD to improve safety with household ambulation and decrease risk of falls.    Baseline 4/5: Deferred due to orthostasis    Time 12    Period Weeks    Status New      PT LONG TERM GOAL #3   Title Pt will improve Berg balance by 8 points as evidenced for age norms to display clinically significant reduced risk of falls for improved independence with ADL completion and walking tasks.    Baseline 4/5: deferred due to orthostasis; 6/1: 37/56    Time 12    Period Weeks    Status On-going    Target Date 10/28/20      PT LONG TERM GOAL #4   Title Pt will improve FOTO to target score equal or greater than 62 to maximize capcacity to improve independence with mobility    Baseline 4/5: 59/62; 6/1: 55    Time 12    Period Weeks    Status  On-going    Target Date 10/28/20                   Plan - 10/16/20 0951     Clinical Impression Statement Patient is somewhat motivated. He was instructed in gait safety with cues to increase step length and improve foot clearance. He continues to have left foot drag and often loses balance requiring min A to avoid fall. Pt does report increased soreness in LLE with prolonged standing. PT performed stretches to help improve flexibility. Patient instructed in LE strengthening exercise. He does require min VCS for proper exercise technique. Patient often gets confused and starts doing another exercise with increased repetition requiring cues to redirect to task. Reinforced HEP and instruction to increase activity at home. Patient would benefit from additional skilled PT Intervention to improve strength, balance and mobility;    Personal Factors and Comorbidities Age;Comorbidity 3+;Time since onset of injury/illness/exacerbation;Past/Current Experience    Examination-Activity Limitations Bathing;Bed Mobility;Squat;Stairs;Bend;Locomotion Level;Stand;Caring for Others;Reach Overhead;Transfers;Dressing    Examination-Participation Restrictions Church;Cleaning;Shop;Community Activity;Driving;Yard Work    Stability/Clinical Decision Making Unstable/Unpredictable    Rehab Potential Fair    PT Frequency 2x / week    PT Duration 12 weeks    PT Treatment/Interventions ADLs/Self Care Home Management;DME Instruction;Gait training;Stair training;Functional mobility training;Therapeutic activities;Therapeutic exercise;Balance training;Neuromuscular re-education;Patient/family education;Manual techniques;Passive range of motion;Energy conservation    PT Next Visit Plan Reassess orthostatics. Complete 10 m gait speed, Berg., if pt able    PT Home Exercise Plan Educated pt and spouse on seated ROM exerices including importance of ankle DF.    Consulted and Agree with Plan of Care Patient;Family  member/caregiver    Family Member Consulted  spouse             Patient will benefit from skilled therapeutic intervention in order to improve the following deficits and impairments:  Abnormal gait, Improper body mechanics, Decreased mobility, Postural dysfunction, Decreased activity tolerance, Decreased endurance, Decreased range of motion, Decreased strength, Decreased balance, Decreased safety awareness, Difficulty walking  Visit Diagnosis: Abnormality of gait and mobility  Difficulty in walking, not elsewhere classified  Muscle weakness (generalized)  Other lack of coordination  Unsteadiness on feet  Other abnormalities of gait and mobility     Problem List There are no problems to display for this patient.   Sutter Ahlgren PT, DPT 10/16/2020, 10:04 AM  [facility] MAIN [facility] SERVICES [address] [place], [place], [address] Phone: [phone]   Fax:  [phone]  Name: Chad Rudy [facility] Sr. MRN: [id number] Date of Birth: 02-01-1934

## 2020-10-21 ENCOUNTER — Ambulatory Visit: Payer: Medicare Other | Admitting: Physical Therapy

## 2020-10-23 ENCOUNTER — Ambulatory Visit: Payer: Medicare Other | Admitting: Physical Therapy

## 2020-10-28 ENCOUNTER — Other Ambulatory Visit: Payer: Self-pay

## 2020-10-28 ENCOUNTER — Ambulatory Visit: Payer: Medicare Other | Admitting: Physical Therapy

## 2020-10-28 ENCOUNTER — Encounter: Payer: Self-pay | Admitting: Physical Therapy

## 2020-10-28 DIAGNOSIS — R2689 Other abnormalities of gait and mobility: Secondary | ICD-10-CM

## 2020-10-28 DIAGNOSIS — R2681 Unsteadiness on feet: Secondary | ICD-10-CM

## 2020-10-28 DIAGNOSIS — R278 Other lack of coordination: Secondary | ICD-10-CM

## 2020-10-28 DIAGNOSIS — R262 Difficulty in walking, not elsewhere classified: Secondary | ICD-10-CM

## 2020-10-28 DIAGNOSIS — R269 Unspecified abnormalities of gait and mobility: Secondary | ICD-10-CM

## 2020-10-28 DIAGNOSIS — M6281 Muscle weakness (generalized): Secondary | ICD-10-CM

## 2020-10-28 NOTE — Therapy (Signed)
[place] MAIN [facility] SERVICES [address] [place], [place], [address] Phone: [phone]   Fax:  [phone]  Physical Therapy Treatment  Patient Details  Name: Chad Basu Pertuit Sr. MRN: [id number] Date of Birth: 29-Mar-1934 Referring Provider (PT): Dr. Ola Spurr   Encounter Date: 10/28/2020   PT End of Session - 10/28/20 0807     Visit Number 14    Number of Visits 33    Date for PT Re-Evaluation 11/25/20    Authorization Type Eval: 4/5; 1/10 PN    Authorization - Visit Number 4    Authorization - Number of Visits 10    PT Start Time 0802    PT Stop Time 0845    PT Time Calculation (min) 43 min    Equipment Utilized During Treatment Gait belt    Activity Tolerance Patient tolerated treatment well    Behavior During Therapy WFL for tasks assessed/performed             Past Medical History:  Diagnosis Date   Coronary artery disease    Depression    Diabetes mellitus without complication ([place])    Seizures ([place])    Stroke ([place])    Vascular dementia ([place])     Past Surgical History:  Procedure Laterality Date   CARDIAC SURGERY      There were no vitals filed for this visit.   Subjective Assessment - 10/28/20 0806     Subjective Patient and wife report no new issues. Patient reports doing okay with no pain;He reports trying to walk a little more but states, "I am probably not doing as much as I should"    Pertinent History Pt is a 87 y.o. male referred to PT for balance and gait due to recurrent falls. He presents with a PMH of: CAD, depression, DM, seizures, CVA, vascular dementia. 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 Pt relies heavily on spouse for history due to baseline cognitive defcits due to his vascular dementia. Hx of orthostatics due to poor liquid intake likely due to his vascular dementia in the past. Pt with spouse at eval and states his referral is for gait and imbalance. Pt and spouse reports on average he  was having 1 fall/week approximately with no specific reason for his falls. Pt relies on chair or bed to stand up from or calls to neighbors to assist in returning pt upright. Pt and spouse repotr pt did have significant fall at the end of january leading to a L shoulder fracture. Unable to specify if any weightbearing precautions. Denies falls at night in low light and a decrease of activity levels since not going to adult daycare due to covid 19 concerns. History of CVA that affected his L side with reported weakness which pt's spouse is curious if this could be affecting his balance. Spouse assists with ADL's such as dressing, bathing, cooking, cleaning. Pt and spouse reports pt spends most of his ambulation at the home inside. Pt's goal is to stop falling and improve his walking.    Limitations Lifting;Standing;Walking    How long can you sit comfortably? N/A    How long can you stand comfortably? a few minutes    How long can you walk comfortably? short household distances    Patient Stated Goals Stop falling, improve walking    Currently in Pain? No/denies    Multiple Pain Sites No  [facility] PT Assessment - 10/28/20 0001       Standardized Balance Assessment   Standardized Balance Assessment 10 meter walk test    10 Meter Walk 0.77 m/s with RW, required CGA for safety, increased risk for falls      Berg Balance Test   Sit to Stand Able to stand without using hands and stabilize independently    Standing Unsupported Able to stand 2 minutes with supervision    Sitting with Back Unsupported but Feet Supported on Floor or Stool Able to sit safely and securely 2 minutes    Stand to Sit Sits safely with minimal use of hands    Transfers Able to transfer safely, definite need of hands    Standing Unsupported with Eyes Closed Able to stand 10 seconds with supervision    Standing Unsupported with Feet Together Able to place feet together independently and stand for 1 minute with  supervision    From Standing, Reach Forward with Outstretched Arm Can reach forward >12 cm safely (5")    From Standing Position, Pick up Object from Floor Able to pick up shoe, needs supervision    From Standing Position, Turn to Look Behind Over each Shoulder Looks behind one side only/other side shows less weight shift    Turn 360 Degrees Needs assistance while turning    Standing Unsupported, Alternately Place Feet on Step/Stool Able to complete >2 steps/needs minimal assist    Standing Unsupported, One Foot in Front Able to take small step independently and hold 30 seconds    Standing on One Leg Tries to lift leg/unable to hold 3 seconds but remains standing independently    Total Score 37    Berg comment: high risk for falls                 TREATMENT: Warm up on nustep, BUE/BLE level 2 x4 min with cues to increase steps per minute >60 for cardiovascular challenge;  Instructed patient in outcome measures to address progress towards goals, see below;       TEST Outcome 08/05/20  Outcome 10/01/20  Outcome 10/28/20 Interpretation  5 times sit<>stand 20.77 sec   18.25 sec   7.37 sec without arms >60 yo, >15 sec indicates increased risk for falls  10 meter walk test        Deferred    0.77 m/s (13 sec) with RW <1.0 m/s indicates increased risk for falls; limited community ambulator  Berg Balance Assessment      29/56 (09/02/20)   37/56   37/56 <36/56 (100% risk for falls), 37-45 (80% risk for falls); 46-51 (>50% risk for falls); 52-55 (lower risk <25% of falls)  FOTO 59/62   55%   49%  Pt self reported increased difficulty with transfers, walking around home and negotiating curbs compared to intake; However when questioned he reports that he thinks he's doing better                      Tolerated session well. Reports minimal fatigue.  Reinforced HEP with instruction to increase gait performance and LE strengthening at home for better carryover.            PT Education -  10/28/20 0807     Education Details exercise, gait safety    Person(s) Educated Patient    Methods Explanation;Verbal cues    Comprehension Verbalized understanding;Returned demonstration;Verbal cues required;Need further instruction  PT Short Term Goals - 10/28/20 0809       PT SHORT TERM GOAL #1   Title Pt will be independent with HEP to improve strength/mobility to improve independence with ADL's    Baseline 08/05/2020: Seated marches, long arc quads; 6/2:  reviewed HEP as pt not yet indep, 6/28: partial indep, does marches some but unable to recall others;    Time 6    Period Weeks    Status On-going    Target Date 11/13/20               PT Long Term Goals - 10/28/20 0810       PT LONG TERM GOAL #1   Title Pt will improve 5xSTS to < 15 sec for clinically significant increase in LE strength and decreased risk of falls.    Baseline 4/5: 20.77 sec with RUE support on chair rail; 6/1: 18.25 sec    Time 4    Period Weeks    Status Achieved    Target Date 11/25/20      PT LONG TERM GOAL #2   Title Pt will improve 10 m gait speed to > 0.8 m/s with LRAD to improve safety with household ambulation and decrease risk of falls.    Baseline 4/5: Deferred due to orthostasis    Time 4    Period Weeks    Status Partially Met    Target Date 11/25/20      PT LONG TERM GOAL #3   Title Pt will improve Berg balance by 8 points as evidenced for age norms to display clinically significant reduced risk of falls for improved independence with ADL completion and walking tasks.    Baseline 4/5: deferred due to orthostasis; 6/1: 37/56    Time 4    Period Weeks    Status On-going    Target Date 11/25/20      PT LONG TERM GOAL #4   Title Pt will improve FOTO to target score equal or greater than 62 to maximize capcacity to improve independence with mobility    Baseline 4/5: 59/62; 6/1: 55    Time 4    Period Weeks    Status Not Met    Target Date 11/25/20                    Plan - 10/28/20 1146     Clinical Impression Statement Patient is somewhat motivated. He was instructed in outcome measures to address progress towards goals. Patient and wife both report feeling like patient is making improvement with better strength and less fall history. He does exhibit improvement in 5 times sit<>Stand as compared to initial evaluation. No recent changes to [facility] Assessment. Patient is still considered at an increased risk for falls. He requires CGA with ambulation for safety and he occasionally drags his left foot. Patient continues to self report a decline in function as evidenced on FOTO survey. However question accuracy as when asked how he percieves his functional level he reports its better. He would benefit from additional month of skilled PT Intervention to improve strength and balance while also progressing HEP.    Personal Factors and Comorbidities Age;Comorbidity 3+;Time since onset of injury/illness/exacerbation;Past/Current Experience    Examination-Activity Limitations Bathing;Bed Mobility;Squat;Stairs;Bend;Locomotion Level;Stand;Caring for Others;Reach Overhead;Transfers;Dressing    Examination-Participation Restrictions Church;Cleaning;Shop;Community Activity;Driving;Yard Work    Stability/Clinical Decision Making Unstable/Unpredictable    Rehab Potential Fair    PT Frequency 2x / week    PT Duration 4  weeks    PT Treatment/Interventions ADLs/Self Care Home Management;DME Instruction;Gait training;Stair training;Functional mobility training;Therapeutic activities;Therapeutic exercise;Balance training;Neuromuscular re-education;Patient/family education;Manual techniques;Passive range of motion;Energy conservation    PT Next Visit Plan Reassess orthostatics. Complete 10 m gait speed, Berg., 82mt if pt able    PT Home Exercise Plan Educated pt and spouse on seated ROM exerices including importance of ankle DF.    Consulted and Agree with Plan of  Care Patient;Family member/caregiver    Family Member Consulted spouse             Patient will benefit from skilled therapeutic intervention in order to improve the following deficits and impairments:  Abnormal gait, Improper body mechanics, Decreased mobility, Postural dysfunction, Decreased activity tolerance, Decreased endurance, Decreased range of motion, Decreased strength, Decreased balance, Decreased safety awareness, Difficulty walking  Visit Diagnosis: Abnormality of gait and mobility  Difficulty in walking, not elsewhere classified  Muscle weakness (generalized)  Other lack of coordination  Unsteadiness on feet  Other abnormalities of gait and mobility     Problem List There are no problems to display for this patient.   Damiana Berrian PT, DPT 10/28/2020, 11:51 AM  [place] [facility]ERVICES [address] [place] [address]Phone: [phone]  Fax:  [phone] Name: Chad SlattenSGateway Surgery CenterSr. MRN: [id number] of Birth: 5Aug 29, 1935

## 2020-10-30 ENCOUNTER — Other Ambulatory Visit: Payer: Self-pay

## 2020-10-30 ENCOUNTER — Ambulatory Visit: Payer: Medicare Other

## 2020-10-30 VITALS — BP 80/43 | HR 54

## 2020-10-30 DIAGNOSIS — M6281 Muscle weakness (generalized): Secondary | ICD-10-CM

## 2020-10-30 DIAGNOSIS — R269 Unspecified abnormalities of gait and mobility: Secondary | ICD-10-CM

## 2020-10-30 DIAGNOSIS — R262 Difficulty in walking, not elsewhere classified: Secondary | ICD-10-CM

## 2020-10-30 DIAGNOSIS — R278 Other lack of coordination: Secondary | ICD-10-CM

## 2020-10-30 DIAGNOSIS — R2681 Unsteadiness on feet: Secondary | ICD-10-CM

## 2020-10-30 DIAGNOSIS — R2689 Other abnormalities of gait and mobility: Secondary | ICD-10-CM

## 2020-10-30 NOTE — Therapy (Signed)
[place] [facility] MAIN [facility] SERVICES [address] [place], [place], [address] Phone: [phone]   Fax:  [phone]  Patient Details  Name: Chad [name] [facility] Sr. MRN: [id number] Date of Birth: 1933-12-24 Referring Provider:  Mick Sell, MD  Encounter Date: 10/30/2020   Pt arrived for session with wife. Author assessed seated BP at start: 60/51 mmHg, 54bpm (MAP 54), then 80/43 mmHg, 54bpm (MAP 55). Wife reports pt has a medication that 'he can take for low bloop pressure,' pull a prescription bottle of midodrine from her purse. Instructions on bottle say to take 1 tablet daily. Wife reports pt does not take daily. Wife reports they do not monitor BP at home. Wife educated on importance of taking medications as they are prescribed, and assisted pt with taking 1 tablet in clinic as he has not taken any today.   Wife educated on risks associated with low BP, particularly in the setting of post stroke and vascular dementia, explained the need to assure adequate brain perfusion. Chartered loss adjuster also educated wife on importance of monitoring BP at home and need to take midodrine prior to arrival for full affect. Pt also has a history of orthostasis in chart.   Session ended without charge, as patient not adequately medically managed for safe participation. Pt's wife encouraged to reach out to prescribing provider [facility]) if further instruction is needed regarding midodrine. Also: midodrine is not on the patient's most recent medication list, despite prescription being filled in April 2022.   Per empirical sources, MAP of or greater is thought to be required for adequate perfusion of organs and brain, with lower values increasing risk of tissue ischemia or infarction.    8:25 AM, 10/30/20 Rosamaria Lints, PT, DPT Physical Therapist - [facility]  Outpatient Physical Therapy- Main Campus [phone]     Rosamaria Lints 10/30/2020, 8:25 AM  [facility] MAIN [facility] SERVICES [address] [place], [place], [address] Phone: [phone]   Fax:  [phone]

## 2020-11-04 ENCOUNTER — Other Ambulatory Visit: Payer: Self-pay

## 2020-11-04 ENCOUNTER — Ambulatory Visit: Payer: Medicare Other | Attending: Infectious Diseases | Admitting: Physical Therapy

## 2020-11-04 ENCOUNTER — Encounter: Payer: Self-pay | Admitting: Physical Therapy

## 2020-11-04 DIAGNOSIS — R2689 Other abnormalities of gait and mobility: Secondary | ICD-10-CM | POA: Insufficient documentation

## 2020-11-04 DIAGNOSIS — R278 Other lack of coordination: Secondary | ICD-10-CM | POA: Insufficient documentation

## 2020-11-04 DIAGNOSIS — R262 Difficulty in walking, not elsewhere classified: Secondary | ICD-10-CM | POA: Diagnosis present

## 2020-11-04 DIAGNOSIS — R2681 Unsteadiness on feet: Secondary | ICD-10-CM | POA: Diagnosis present

## 2020-11-04 DIAGNOSIS — M6281 Muscle weakness (generalized): Secondary | ICD-10-CM | POA: Insufficient documentation

## 2020-11-04 DIAGNOSIS — R269 Unspecified abnormalities of gait and mobility: Secondary | ICD-10-CM | POA: Insufficient documentation

## 2020-11-04 NOTE — Patient Instructions (Signed)
Today and Tomorrow  I want you to do the following:  1- Stand up and sit down 5 times out of your chair  2- Walk around 1 lap around your home on the inside.   Do this at least 1x today and 1x tomorrow.

## 2020-11-04 NOTE — Therapy (Signed)
[place] MAIN [facility] SERVICES [address] [place], [place], [address] Phone: [phone]   Fax:  [phone]  Physical Therapy Treatment  Patient Details  Name: Chad Wittler [place] Sr. MRN: [id number] Date of Birth: [birth year] Referring Provider (PT): Dr. Ola Spurr   Encounter Date: 11/04/2020   PT End of Session - 11/04/20 0853     Visit Number 15    Number of Visits 33    Date for PT Re-Evaluation 11/25/20    Authorization Type Eval: 4/5; 1/10 PN    Authorization - Visit Number 5    Authorization - Number of Visits 10    PT Start Time 0803    PT Stop Time 0846    PT Time Calculation (min) 43 min    Equipment Utilized During Treatment Gait belt    Activity Tolerance Patient tolerated treatment well    Behavior During Therapy WFL for tasks assessed/performed             Past Medical History:  Diagnosis Date   Coronary artery disease    Depression    Diabetes mellitus without complication ([place])    Seizures ([place])    Stroke ([place])    Vascular dementia ([place])     Past Surgical History:  Procedure Laterality Date   CARDIAC SURGERY      There were no vitals filed for this visit.   Subjective Assessment - 11/04/20 0811     Subjective Patient and wife report no new issues. Patient reports trying to walk around more at home. Denies any new falls. Reports taking medication this morning as prescribed.    Pertinent History Pt is a 87 y.o. male referred to PT for balance and gait due to recurrent falls. He presents with a PMH of: CAD, depression, DM, seizures, CVA, vascular dementia. 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 Pt relies heavily on spouse for history due to baseline cognitive defcits due to his vascular dementia. Hx of orthostatics due to poor liquid intake likely due to his vascular dementia in the past. Pt with spouse at eval and states his referral is for gait and imbalance. Pt and spouse reports on average he was having 1  fall/week approximately with no specific reason for his falls. Pt relies on chair or bed to stand up from or calls to neighbors to assist in returning pt upright. Pt and spouse repotr pt did have significant fall at the end of january leading to a L shoulder fracture. Unable to specify if any weightbearing precautions. Denies falls at night in low light and a decrease of activity levels since not going to adult daycare due to covid 19 concerns. History of CVA that affected his L side with reported weakness which pt's spouse is curious if this could be affecting his balance. Spouse assists with ADL's such as dressing, bathing, cooking, cleaning. Pt and spouse reports pt spends most of his ambulation at the home inside. Pt's goal is to stop falling and improve his walking.    Limitations Lifting;Standing;Walking    How long can you sit comfortably? N/A    How long can you stand comfortably? a few minutes    How long can you walk comfortably? short household distances    Patient Stated Goals Stop falling, improve walking    Currently in Pain? No/denies    Multiple Pain Sites No              TREATMENT: BP at start  of session: 100/49, HR 63 Ex:  Forward step ups on 6 inch step with B rail assist x15 reps with CGA for safety and cues for sequencing; Patient fatigued after exercise;   Seated LAQ 2# x15 reps each LE with ankle DF to facilitate better ROM and quad strengthening;   NMR:  Standing on airex pad: Standing with feet together: Eyes open unsupported 20 sec hold x2 reps; Feet together, head turns side/side x5 reps, head turns up/down x5 reps with min A for safety and cues for gaze stabilization for better balance control;  Standing left foot on airex, right foot on 6 inch step: unsupported 10 sec hold x2 reps with min A and heavy forward lean requiring cues for posterior weight shift and erect posture;   Patient heavily fatigued with balance exercise; Following exercise: BP  82/52 Rechecked BP as patient had been moving UE, repeat vitals: 91/60  Following exercise, patient instructed in gait with RW, x140 feet x1 sets with CGA-min A for safety with slight improvement in weight bearing on LLE but patient continues to have uneven cadence with short stance on LLE and decreased step length on RLE with antalgic like gait pattern due to weakness in LLE; Patient also exhibits increased difficulty turning with walker getting too far out in front requiring min A for balance recovery;      Patient would benefit from additional gait training with RW to improve awareness and assist patient with remembering to use it.   Patient tolerated session fair. He reports increased fatigue in LLE and difficulty with prolonged standing/walking. Patient required min-mod VCs for erect posture and weight shift for better stance control.    He denies any pain at end of session. He does report increased fatigue at end of session.  Reinforced importance of activity at home as that does seem to help with vitals and will improve cardiovascular endurance; Instructed patient in 2 specific exercise for this week to improve HEP adherence.                     PT Education - 11/04/20 0853     Education Details exercise/balance, gait safety;    Person(s) Educated Patient;Spouse    Methods Explanation;Verbal cues;Handout    Comprehension Verbalized understanding;Returned demonstration;Verbal cues required;Need further instruction              PT Short Term Goals - 10/28/20 0809       PT SHORT TERM GOAL #1   Title Pt will be independent with HEP to improve strength/mobility to improve independence with ADL's    Baseline 08/05/2020: Seated marches, long arc quads; 6/2:  reviewed HEP as pt not yet indep, 6/28: partial indep, does marches some but unable to recall others;    Time 6    Period Weeks    Status On-going    Target Date 11/13/20               PT Long Term Goals -  10/28/20 0810       PT LONG TERM GOAL #1   Title Pt will improve 5xSTS to < 15 sec for clinically significant increase in LE strength and decreased risk of falls.    Baseline 4/5: 20.77 sec with RUE support on chair rail; 6/1: 18.25 sec    Time 4    Period Weeks    Status Achieved    Target Date 11/25/20      PT LONG TERM GOAL #2   Title  Pt will improve 10 m gait speed to > 0.8 m/s with LRAD to improve safety with household ambulation and decrease risk of falls.    Baseline 4/5: Deferred due to orthostasis    Time 4    Period Weeks    Status Partially Met    Target Date 11/25/20      PT LONG TERM GOAL #3   Title Pt will improve Berg balance by 8 points as evidenced for age norms to display clinically significant reduced risk of falls for improved independence with ADL completion and walking tasks.    Baseline 4/5: deferred due to orthostasis; 6/1: 37/56    Time 4    Period Weeks    Status On-going    Target Date 11/25/20      PT LONG TERM GOAL #4   Title Pt will improve FOTO to target score equal or greater than 62 to maximize capcacity to improve independence with mobility    Baseline 4/5: 59/62; 6/1: 55    Time 4    Period Weeks    Status Not Met    Target Date 11/25/20                   Plan - 11/04/20 0853     Clinical Impression Statement Patient participated fair within session. He does fatigue quickly requiring short seated rest breaks. Vitals monitored. Patient reports new onset chest burning when burping. He reports he hasn't told his doctor about it and it has been going on for a few weeks. Recommend patient reach out to MD regarding chest discomfort. He was instructed in advanced LE strengthening and balance exercise. Patient did have difficulty with standing on airex pad with increased unsteadiness. Patient does require min A throughout most of session. PT provided patient with written instructions for 2 specific exercises to do to increase HEP adherence.  Patient would benefit from additional skilled PT intervention to improve strength, balance and mobility;    Personal Factors and Comorbidities Age;Comorbidity 3+;Time since onset of injury/illness/exacerbation;Past/Current Experience    Examination-Activity Limitations Bathing;Bed Mobility;Squat;Stairs;Bend;Locomotion Level;Stand;Caring for Others;Reach Overhead;Transfers;Dressing    Examination-Participation Restrictions Church;Cleaning;Shop;Community Activity;Driving;Yard Work    Stability/Clinical Decision Making Unstable/Unpredictable    Rehab Potential Fair    PT Frequency 2x / week    PT Duration 4 weeks    PT Treatment/Interventions ADLs/Self Care Home Management;DME Instruction;Gait training;Stair training;Functional mobility training;Therapeutic activities;Therapeutic exercise;Balance training;Neuromuscular re-education;Patient/family education;Manual techniques;Passive range of motion;Energy conservation    PT Next Visit Plan Reassess orthostatics. Complete 10 m gait speed, Berg., 40mt if pt able    PT Home Exercise Plan Educated pt and spouse on seated ROM exerices including importance of ankle DF.    Consulted and Agree with Plan of Care Patient;Family member/caregiver    Family Member Consulted spouse             Patient will benefit from skilled therapeutic intervention in order to improve the following deficits and impairments:  Abnormal gait, Improper body mechanics, Decreased mobility, Postural dysfunction, Decreased activity tolerance, Decreased endurance, Decreased range of motion, Decreased strength, Decreased balance, Decreased safety awareness, Difficulty walking  Visit Diagnosis: Abnormality of gait and mobility  Difficulty in walking, not elsewhere classified  Muscle weakness (generalized)  Other lack of coordination  Unsteadiness on feet  Other abnormalities of gait and mobility     Problem List There are no problems to display for this  patient.   Arilla Hice PT, DPT 11/04/2020, 9:17 AM  [place]  [place], [place], [address] Phone: [phone]   Fax:  [phone]  Name: Chad Ryser [facility] Sr. MRN: [id number] Date of Birth: 01-14-1934

## 2020-11-06 ENCOUNTER — Other Ambulatory Visit: Payer: Self-pay

## 2020-11-06 ENCOUNTER — Ambulatory Visit: Payer: Medicare Other

## 2020-11-06 DIAGNOSIS — R269 Unspecified abnormalities of gait and mobility: Secondary | ICD-10-CM | POA: Diagnosis not present

## 2020-11-06 DIAGNOSIS — R278 Other lack of coordination: Secondary | ICD-10-CM

## 2020-11-06 DIAGNOSIS — R262 Difficulty in walking, not elsewhere classified: Secondary | ICD-10-CM

## 2020-11-06 DIAGNOSIS — M6281 Muscle weakness (generalized): Secondary | ICD-10-CM

## 2020-11-06 DIAGNOSIS — R2681 Unsteadiness on feet: Secondary | ICD-10-CM

## 2020-11-06 DIAGNOSIS — R2689 Other abnormalities of gait and mobility: Secondary | ICD-10-CM

## 2020-11-06 NOTE — Therapy (Addendum)
[place] [facility] MAIN [facility] SERVICES [address] [place], [place], [address] Phone: [phone]   Fax:  [phone]  Physical Therapy Treatment  Patient Details  Name: Chad Genter Goldin Sr. MRN: [id number] Date of Birth: 1933-08-10 Referring Provider (PT): Dr. Sampson Goon   Encounter Date: 11/06/2020   PT End of Session - 11/06/20 0830     Visit Number 16    Number of Visits 33    Date for PT Re-Evaluation 11/25/20    Authorization Type Medicare [employer] (traditional)    Authorization Time Period 10/28/20-11/25/20    PT Start Time 0802    PT Stop Time 0842    PT Time Calculation (min) 40 min    Equipment Utilized During Treatment Gait belt    Activity Tolerance Patient tolerated treatment well;Patient limited by fatigue;Treatment limited secondary to medical complications (Comment)    Behavior During Therapy [facility] for tasks assessed/performed;Flat affect   cognitive deficits at baseline            Past Medical History:  Diagnosis Date   Coronary artery disease    Depression    Diabetes mellitus without complication (HCC)    Seizures (HCC)    Stroke (HCC)    Vascular dementia (HCC)     Past Surgical History:  Procedure Laterality Date   CARDIAC SURGERY      There is no height or weight on file to calculate BMI.  There were no vitals filed for this visit. 99/45 mmHg 52bpm Seated 76/43 mmHg 70bpm standing 0 minutes 83/34 mmHg 51bpm sitting   Subjective Assessment - 11/06/20 0827     Subjective Wife denies any medical/medication updates since prior visit. Wife confused about conversation regarding midodrine medication 7 days prior with this Chad [name]- she provides a variable narrative regarding when the patient is getting medication. Today she reports she gave him 2 pills "because she thought he needed it," and author remainds wife that medication is prescribed to take 1 pill as it is written on bottle.    Pertinent History Pt is a 87 y.o.  male referred to PT for balance and gait due to recurrent falls. He presents with a PMH of: CAD, depression, DM, seizures, CVA, vascular dementia. 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 Pt relies heavily on spouse for history due to baseline cognitive defcits due to his vascular dementia. Hx of orthostatics due to poor liquid intake likely due to his vascular dementia in the past. Pt with spouse at eval and states his referral is for gait and imbalance. Pt and spouse reports on average he was having 1 fall/week approximately with no specific reason for his falls. Pt relies on chair or bed to stand up from or calls to neighbors to assist in returning pt upright. Pt and spouse repotr pt did have significant fall at the end of january leading to a L shoulder fracture. Unable to specify if any weightbearing precautions. Denies falls at night in low light and a decrease of activity levels since not going to adult daycare due to covid 19 concerns. History of CVA that affected his L side with reported weakness which pt's spouse is curious if this could be affecting his balance. Spouse assists with ADL's such as dressing, bathing, cooking, cleaning. Pt and spouse reports pt spends most of his ambulation at the home inside. Pt's goal is to stop falling and improve his walking.    Currently in Pain? No/denies  [facility] PT Assessment - 11/06/20 0001       Transfers   Five time sit to stand comments  13.44sec   RW ad lib (mostly for balance, minimal push-off use)     Standardized Balance Assessment   Standardized Balance Assessment 10 meter walk test    10 Meter Walk 0.47m/s c RW, minGuardA for safety due to orthostasis and balance impairment            INTERVENTION THIS DATE: -Orthostatic BP assessment, sitting/standing -Positive this date, hence made efforts to avoid interventions this date that require prolonged standing time -Recovery of orthostatic drops requires >3 minutes -Pt  continues to not report any symptoms associated with hypotension, however given his vascular dementia, his ability to report presyncopal prodrome is unclear, additionally symptomology associated with orthostasis has empirically been shown to be quite variable  -MinGuard SPT transport chair to guest arm-chair x2 -STS from chair 3 sets of 5, 1 set of 10 (2-3 minutes recovery between efforts -RW AMB for 10 meters, 2 bouts with seated recovery between  -Continued caregiver education regarding medication compliance and safety       PT Short Term Goals - 10/28/20 0809       PT SHORT TERM GOAL #1   Title Pt will be independent with HEP to improve strength/mobility to improve independence with ADL's    Baseline 08/05/2020: Seated marches, long arc quads; 6/2:  reviewed HEP as pt not yet indep, 6/28: partial indep, does marches some but unable to recall others;    Time 6    Period Weeks    Status On-going    Target Date 11/13/20               PT Long Term Goals - 11/06/20 0847       PT LONG TERM GOAL #1   Title Pt will improve 5xSTS to < 15 sec for clinically significant increase in LE strength and decreased risk of falls.    Baseline 4/5: 20.77 sec with RUE support on chair rail; 6/1: 18.25 sec; 11/06/20: 13.44sec (RW ad lib)    Time 4    Period Weeks    Status Achieved    Target Date 11/25/20      PT LONG TERM GOAL #2   Title Pt will improve 10 m gait speed to > 0.8 m/s with LRAD to improve safety with household ambulation and decrease risk of falls.    Baseline 4/5: Deferred due to orthostasis; 11/06/20: 0.68m/s (RW)    Time 4    Period Weeks    Status On-going    Target Date 11/06/20      PT LONG TERM GOAL #3   Title Pt will improve Berg balance by 8 points as evidenced for age norms to display clinically significant reduced risk of falls for improved independence with ADL completion and walking tasks.    Baseline 4/5: deferred due to orthostasis; 6/1: 37/56; 6/28: 37/56     Time 4    Period Weeks    Status On-going    Target Date 11/25/20      PT LONG TERM GOAL #4   Title Pt will improve FOTO to target score equal or greater than 62 to maximize capcacity to improve independence with mobility    Baseline 4/5: 59/62; 6/1: 55; 6/28: 37/56    Time 4    Period Weeks    Status On-going    Target Date 11/25/20  Plan - 11/06/20 8828     Clinical Impression Statement Continued with current plan of care as laid out in evaluation and recent prior sessions. Pt significantly orthostatic this date, no symptoms but first standing vitals demonstrate MAP: 52, insufficient for perfusion of organs per literature. BP more stable when seated for prolonged periods. Pt's wife demonstrate poor memory recall regarding discussion about midodrine 7 days prior with Chad [name], gives fluctuating details on use of medication this date, but does consistently reports having given the patient 2 midodrine pills this morning, with incorrect memory recall regarding instruction son bottle- Chad [name] explained potential for danger of taking medications >prescribed dose. Per recent conversations, pt does not have a means of monitoring BP at home. Reassessment of tests/measures today as directed in past treatment note. Compared to evaluation visit, pt showing some improvement in 5xSTS and gait speed, however they do remain outside of age-matched norm values and indicated falls risk- will continue to work toward these goals. Pt remains motivated to advance progress toward goals . Rest breaks provided as needed, pt quick to ask when needed. Pt does require varying levels of assistance and cuing for completion of exercises for correct form and sometimes due to pain/weakness. Pt closely monitored throughout session for safe vitals response and to maximize patient safety during interventions. Pt continues to demonstrate progress toward goals AEB progression of some interventions this date  either in volume or intensity.    Personal Factors and Comorbidities Age;Comorbidity 3+;Time since onset of injury/illness/exacerbation;Past/Current Experience    Examination-Activity Limitations Bathing;Bed Mobility;Squat;Stairs;Bend;Locomotion Level;Stand;Caring for Others;Reach Overhead;Transfers;Dressing    Examination-Participation Restrictions Church;Cleaning;Shop;Community Activity;Driving;Yard Work    Conservation officer, historic buildings Unstable/Unpredictable    Visual merchandiser    Rehab Potential Fair    PT Frequency 2x / week    PT Duration 4 weeks    PT Treatment/Interventions ADLs/Self Care Home Management;DME Instruction;Gait training;Stair training;Functional mobility training;Therapeutic activities;Therapeutic exercise;Balance training;Neuromuscular re-education;Patient/family education;Manual techniques;Passive range of motion;Energy conservation    PT Next Visit Plan FOTO: Continue to work on standing activity tolerance, strength, motor control for ADL safety    PT Home Exercise Plan No updates today    Consulted and Agree with Plan of Care Patient;Family member/caregiver    Family Member Consulted spouse             Patient will benefit from skilled therapeutic intervention in order to improve the following deficits and impairments:  Abnormal gait, Improper body mechanics, Decreased mobility, Postural dysfunction, Decreased activity tolerance, Decreased endurance, Decreased range of motion, Decreased strength, Decreased balance, Decreased safety awareness, Difficulty walking  Visit Diagnosis: Abnormality of gait and mobility  Difficulty in walking, not elsewhere classified  Muscle weakness (generalized)  Other lack of coordination  Unsteadiness on feet  Other abnormalities of gait and mobility     Problem List There are no problems to display for this patient.  9:57 AM, 11/06/20 Chad [name], PT, DPT Physical Therapist - [facility]  Outpatient Physical Therapy- Main Campus [phone]     Chad [name] 11/06/2020, 9:02 AM  [place] [facility] MAIN [facility] SERVICES [address] [place], [place], [address] Phone: [phone]   Fax:  [phone]  Name: Chad Buchta Helmer Sr. MRN: [id number] Date of Birth: 05-31-1933

## 2020-11-07 ENCOUNTER — Telehealth: Payer: Self-pay

## 2020-11-07 NOTE — Telephone Encounter (Addendum)
Author contacted [facility] to relay concerns of medication compliance to Dr. Juliann Pares, prescribing Cardiologist. Pt prescribed midodrine 2.5mg  1 tablet twice daily back in April 2022. Pt has chronic orthostasis problem, as well as baseline hypotension. Per 2 conversations with pt's wife (she manages his medications due to dementia) it sounds like patient has not been taking this medication very much- he is still working on the first filling, a 30d supply, now nearly 3 months post filling. Author explained implication for medication, reviewed instructions on bottle with her. She reportedly does not have a means to monitor the patient's BP at home. Pt arrived another session 1 week later (11/06/20) wife reports having given him 2 pills that morning prior to arrival. She had no explanation as to why she gave him 2, then later said that the bottle indicated 2 pills. Author reminded wife that instructions are for 1 tablet 2x daily.   Author concerned that wife may have some cognitive deficits that are impacting her ability to safely manage this medication (potentially all medications?), she reports difficulty understanding the purpose of the medication, the dosage. She did not contact the patient's cardiologist as recommended previous week.   Author spoke with Lowella Bandy at [facility] cardiology, who took the message, plan to relay message to Dr. Glennis Brink nurse, will contact author later with a response.  Call made at 9:09am.  9:52 AM, 11/07/20 Rosamaria Lints, PT, DPT Physical Therapist - [facility]  Outpatient Physical Therapy- Main Campus [phone]     Addendum 11/10/20 Received callback from Dr. Glennis Brink nurse (afternoon of 7/8) who advised author reach out to PCP regarding concerns about safe medication management. [place] feels that pt should be able to have a CSW assigned to assess with this matter. Message passed along to this patient's primary  physical therapist.   4:45 PM, 11/10/20 Rosamaria Lints, PT, DPT Physical Therapist - [facility]  [phone] [facility])

## 2020-11-10 ENCOUNTER — Telehealth: Payer: Self-pay | Admitting: Physical Therapy

## 2020-11-10 NOTE — Telephone Encounter (Signed)
PT contacted patient's PCP Dr. Sampson Goon office per Cardiology request.  Spoke with nurse, informed them of patient's chronic hypotension and concern of poor medicine management. Patient prescribed Midodrine by cardiology, however patient is not consistently taking medication.  Concerned patient and caregiver are having a hard time with medication management. When we reached out to Dr. Juliann Pares he suggested we inform PCP as they would have more ability to follow up with patient and consider social services to follow up to assess medication management safety.  Nurse stated she would follow up after speaking with Dr. Sampson Goon.  Thank you for this referral. Cornell Barman, PT, DPT 11/10/20 11:28 AM [facility] [phone]

## 2020-11-11 ENCOUNTER — Ambulatory Visit: Payer: Medicare Other | Admitting: Physical Therapy

## 2020-11-13 ENCOUNTER — Ambulatory Visit: Payer: Medicare Other | Admitting: Physical Therapy

## 2020-11-18 ENCOUNTER — Encounter: Payer: Self-pay | Admitting: Physical Therapy

## 2020-11-18 ENCOUNTER — Ambulatory Visit: Payer: Medicare Other

## 2020-11-18 ENCOUNTER — Ambulatory Visit: Payer: Medicare Other | Admitting: Physical Therapy

## 2020-11-18 ENCOUNTER — Other Ambulatory Visit: Payer: Self-pay

## 2020-11-18 DIAGNOSIS — R2681 Unsteadiness on feet: Secondary | ICD-10-CM

## 2020-11-18 DIAGNOSIS — R262 Difficulty in walking, not elsewhere classified: Secondary | ICD-10-CM

## 2020-11-18 DIAGNOSIS — R278 Other lack of coordination: Secondary | ICD-10-CM

## 2020-11-18 DIAGNOSIS — R269 Unspecified abnormalities of gait and mobility: Secondary | ICD-10-CM | POA: Diagnosis not present

## 2020-11-18 DIAGNOSIS — M6281 Muscle weakness (generalized): Secondary | ICD-10-CM

## 2020-11-18 DIAGNOSIS — R2689 Other abnormalities of gait and mobility: Secondary | ICD-10-CM

## 2020-11-18 NOTE — Therapy (Signed)
[place] [facility] MAIN [facility] SERVICES [address] [place], [place], [address] Phone: [phone]   Fax:  [phone]  Physical Therapy Treatment  Patient Details  Name: Chad Tagliaferro Hallquist Sr. MRN: [id number] Date of Birth: 25-May-1933 Referring Provider (PT): Dr. Sampson Goon   Encounter Date: 11/18/2020   PT End of Session - 11/18/20 0822     Visit Number 17    Number of Visits 33    Date for PT Re-Evaluation 11/25/20    Authorization Type Medicare [employer] (traditional)    Authorization Time Period 10/28/20-11/25/20    PT Start Time 0803    PT Stop Time 0845    PT Time Calculation (min) 42 min    Equipment Utilized During Treatment Gait belt    Activity Tolerance Patient tolerated treatment well;Patient limited by fatigue;Treatment limited secondary to medical complications (Comment)    Behavior During Therapy [facility] for tasks assessed/performed;Flat affect   cognitive deficits at baseline            Past Medical History:  Diagnosis Date   Coronary artery disease    Depression    Diabetes mellitus without complication (HCC)    Seizures (HCC)    Stroke (HCC)    Vascular dementia (HCC)     Past Surgical History:  Procedure Laterality Date   CARDIAC SURGERY      There were no vitals filed for this visit.   Subjective Assessment - 11/18/20 0820     Subjective Wife reports she is upset over how confusing all this has been. She states that she is now afraid to give [name] his medication. She has been giving him 1 Midodrine last 2 days but is afraid to give it to him 2x a day as the prescription states. She reports, "I didn't realize when the therapist asked me last week about it that when I said 2 he thought I meant 2 at the same time, but that's not what I did. I have been giving it to him 2x a day like the prescription but now I'm scared to give it to him after all the problems this has caused." She denies any recent falls. She reports taking him  to [facility] for follow up appointment with good response.    Pertinent History Pt is a 87 y.o. male referred to PT for balance and gait due to recurrent falls. He presents with a PMH of: CAD, depression, DM, seizures, CVA, vascular dementia. 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 Pt relies heavily on spouse for history due to baseline cognitive defcits due to his vascular dementia. Hx of orthostatics due to poor liquid intake likely due to his vascular dementia in the past. Pt with spouse at eval and states his referral is for gait and imbalance. Pt and spouse reports on average he was having 1 fall/week approximately with no specific reason for his falls. Pt relies on chair or bed to stand up from or calls to neighbors to assist in returning pt upright. Pt and spouse repotr pt did have significant fall at the end of january leading to a L shoulder fracture. Unable to specify if any weightbearing precautions. Denies falls at night in low light and a decrease of activity levels since not going to adult daycare due to covid 19 concerns. History of CVA that affected his L side with reported weakness which pt's spouse is curious if this could be affecting his balance. Spouse assists with ADL's such as  dressing, bathing, cooking, cleaning. Pt and spouse reports pt spends most of his ambulation at the home inside. Pt's goal is to stop falling and improve his walking.    Currently in Pain? No/denies    Multiple Pain Sites No               TREATMENT: Patient brought in medication (midodrine)- last filled 10/08/20, She reports she has given him 1 pill a day as she is too concerned to give him 2 again after all the problems it caused. Recommended patient follow up with pharmacist and MD regarding prescription and encouraged them to take medication as directed 1 pill, 2x a day;  BP at start of care 90/50, denies any symptoms, HR 63   Instructed patient in warm up on Nustep BUE/BLE level2 x 4 min  (Unbilled): Following Nustep BP 98/41, HR 52  Seated with 2# ankle weight: -Hip flexion march x15 reps bilaterally with min VCs to increase ROM for better strengthening;  -LAQ x15 reps with cues for ankle DF to increase ROM and strength.   Standing: Side stepping in parallel bars x10 feet x3 laps each direction with 2 HHA on railing; Required min VCs to keep feet oriented forward to avoid hip ER to isolate hip abductor strengthening;   BP: 102/51, HR 63  Gait around gym x80 feet with RW with CGA to min A for safety; Patient ambulates with RW with uneven cadence with heavy step with RLE due to decreased weight shift to LLE due to LLE weakness from old CVA. He does exhibit increased foot drag on LLE. Patient also requires cues to stay close to RW especially with turns for better gait safety;   Required min A for transfer to wheelchair for safety;   Tolerated session fair. He continues to have hypotension during session. Patient/caregiver instructed to follow prescription for better medication management. Also reinforced importance of HEP adherence for better mobility at home.                 PT Education - 11/18/20 0822     Education Details exercise/positioning; vital safety    Person(s) Educated Patient;Spouse    Methods Explanation;Verbal cues    Comprehension Verbalized understanding;Returned demonstration;Verbal cues required;Need further instruction              PT Short Term Goals - 10/28/20 0809       PT SHORT TERM GOAL #1   Title Pt will be independent with HEP to improve strength/mobility to improve independence with ADL's    Baseline 08/05/2020: Seated marches, long arc quads; 6/2:  reviewed HEP as pt not yet indep, 6/28: partial indep, does marches some but unable to recall others;    Time 6    Period Weeks    Status On-going    Target Date 11/13/20               PT Long Term Goals - 11/06/20 0847       PT LONG TERM GOAL #1   Title Pt will  improve 5xSTS to < 15 sec for clinically significant increase in LE strength and decreased risk of falls.    Baseline 4/5: 20.77 sec with RUE support on chair rail; 6/1: 18.25 sec; 11/06/20: 13.44sec (RW ad lib)    Time 4    Period Weeks    Status Achieved    Target Date 11/25/20      PT LONG TERM GOAL #2   Title Pt will improve 10  m gait speed to > 0.8 m/s with LRAD to improve safety with household ambulation and decrease risk of falls.    Baseline 4/5: Deferred due to orthostasis; 11/06/20: 0.70m/s (RW)    Time 4    Period Weeks    Status On-going    Target Date 11/06/20      PT LONG TERM GOAL #3   Title Pt will improve Berg balance by 8 points as evidenced for age norms to display clinically significant reduced risk of falls for improved independence with ADL completion and walking tasks.    Baseline 4/5: deferred due to orthostasis; 6/1: 37/56; 6/28: 37/56    Time 4    Period Weeks    Status On-going    Target Date 11/25/20      PT LONG TERM GOAL #4   Title Pt will improve FOTO to target score equal or greater than 62 to maximize capcacity to improve independence with mobility    Baseline 4/5: 59/62; 6/1: 55; 6/28: 37/56    Time 4    Period Weeks    Status On-going    Target Date 11/25/20                   Plan - 11/18/20 1004     Clinical Impression Statement Patient motivated and participated well within session. He is still having difficulty managing BP and wife is concerned and hesitant to give medication 2x a day. She reports she is giving the Midodrine 1x a day. Recommended wife follow up with pharmacist and MD and recommended she follow prescription. Patient continues to have hypotension during session. It seems it was improved with exercise. He does require min VCs for proper positioning and exercise technique. Patient reports difficulty doing HEP as he is fatigued and doesn't feel like doing it most of the time. Reinforced importance of activity for strengthening  and to improve BP management. patient would benefit from additional skilled PT intervention to improve strength, balance and mobility;    Personal Factors and Comorbidities Age;Comorbidity 3+;Time since onset of injury/illness/exacerbation;Past/Current Experience    Examination-Activity Limitations Bathing;Bed Mobility;Squat;Stairs;Bend;Locomotion Level;Stand;Caring for Others;Reach Overhead;Transfers;Dressing    Examination-Participation Restrictions Church;Cleaning;Shop;Community Activity;Driving;Yard Work    Stability/Clinical Decision Making Unstable/Unpredictable    Rehab Potential Fair    PT Frequency 2x / week    PT Duration 4 weeks    PT Treatment/Interventions ADLs/Self Care Home Management;DME Instruction;Gait training;Stair training;Functional mobility training;Therapeutic activities;Therapeutic exercise;Balance training;Neuromuscular re-education;Patient/family education;Manual techniques;Passive range of motion;Energy conservation    PT Next Visit Plan FOTO: Continue to work on standing activity tolerance, strength, motor control for ADL safety    PT Home Exercise Plan No updates today    Consulted and Agree with Plan of Care Patient;Family member/caregiver    Family Member Consulted spouse             Patient will benefit from skilled therapeutic intervention in order to improve the following deficits and impairments:  Abnormal gait, Improper body mechanics, Decreased mobility, Postural dysfunction, Decreased activity tolerance, Decreased endurance, Decreased range of motion, Decreased strength, Decreased balance, Decreased safety awareness, Difficulty walking  Visit Diagnosis: Abnormality of gait and mobility  Difficulty in walking, not elsewhere classified  Muscle weakness (generalized)  Other lack of coordination  Unsteadiness on feet  Other abnormalities of gait and mobility     Problem List There are no problems to display for this  patient.   Jamell Opfer 11/18/2020, 10:09 AM  [facility] SERVICES  [address] [place], [place], [address] Phone: [phone]   Fax:  [phone]  Name: Tyreck Bell Bo Sr. MRN: [id number] Date of Birth: May 11, 1933

## 2020-11-20 ENCOUNTER — Encounter: Payer: Self-pay | Admitting: Physical Therapy

## 2020-11-20 ENCOUNTER — Ambulatory Visit: Payer: Medicare Other | Admitting: Physical Therapy

## 2020-11-20 ENCOUNTER — Other Ambulatory Visit: Payer: Self-pay

## 2020-11-20 DIAGNOSIS — R2689 Other abnormalities of gait and mobility: Secondary | ICD-10-CM

## 2020-11-20 DIAGNOSIS — R2681 Unsteadiness on feet: Secondary | ICD-10-CM

## 2020-11-20 DIAGNOSIS — R278 Other lack of coordination: Secondary | ICD-10-CM

## 2020-11-20 DIAGNOSIS — M6281 Muscle weakness (generalized): Secondary | ICD-10-CM

## 2020-11-20 DIAGNOSIS — R262 Difficulty in walking, not elsewhere classified: Secondary | ICD-10-CM

## 2020-11-20 DIAGNOSIS — R269 Unspecified abnormalities of gait and mobility: Secondary | ICD-10-CM

## 2020-11-20 NOTE — Therapy (Signed)
[place] [facility] MAIN [facility] SERVICES [address] [place], [place], [address] Phone: [phone]   Fax:  [phone]  Physical Therapy Treatment  Patient Details  Name: Chad Cumbie Feldhaus Sr. MRN: [id number] Date of Birth: [birth year] Referring Provider (PT): Dr. Sampson Goon   Encounter Date: 11/20/2020   PT End of Session - 11/20/20 1053     Visit Number 18    Number of Visits 33    Date for PT Re-Evaluation 11/25/20    Authorization Type Medicare [employer] (traditional)    Authorization Time Period 10/28/20-11/25/20    PT Start Time 0930    PT Stop Time 1020    PT Time Calculation (min) 50 min    Equipment Utilized During Treatment Gait belt    Activity Tolerance Patient tolerated treatment well;Patient limited by fatigue;Treatment limited secondary to medical complications (Comment)    Behavior During Therapy [facility] for tasks assessed/performed;Flat affect   cognitive deficits at baseline            Past Medical History:  Diagnosis Date   Coronary artery disease    Depression    Diabetes mellitus without complication (HCC)    Seizures (HCC)    Stroke (HCC)    Vascular dementia (HCC)     Past Surgical History:  Procedure Laterality Date   CARDIAC SURGERY      There were no vitals filed for this visit.   Subjective Assessment - 11/20/20 1049     Subjective Pt reports he is doing well today. Wife reports pt is taking medications including Midodrine as it was prescribed. She and pt deny any recent falls. Pt also denies pain. No questions at this time.    Pertinent History Pt is a 87 y.o. male referred to PT for balance and gait due to recurrent falls. He presents with a PMH of: CAD, depression, DM, seizures, CVA, vascular dementia. 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 Pt relies heavily on spouse for history due to baseline cognitive defcits due to his vascular dementia. Hx of orthostatics due to poor liquid intake likely due to his  vascular dementia in the past. Pt with spouse at eval and states his referral is for gait and imbalance. Pt and spouse reports on average he was having 1 fall/week approximately with no specific reason for his falls. Pt relies on chair or bed to stand up from or calls to neighbors to assist in returning pt upright. Pt and spouse repotr pt did have significant fall at the end of january leading to a L shoulder fracture. Unable to specify if any weightbearing precautions. Denies falls at night in low light and a decrease of activity levels since not going to adult daycare due to covid 19 concerns. History of CVA that affected his L side with reported weakness which pt's spouse is curious if this could be affecting his balance. Spouse assists with ADL's such as dressing, bathing, cooking, cleaning. Pt and spouse reports pt spends most of his ambulation at the home inside. Pt's goal is to stop falling and improve his walking.    Currently in Pain? No/denies               TREATMENT:  BP at start of care 75/43, MAP 50 - denies any symptoms, HR 59 Manual BP: 78/50, MAP 59   Instructed patient in warm up on Nustep BLE level 1-2 x 5 min with continuous symptom monitoring; performed for warm up and attempt  to raise BP: Following Nustep BP 84/52 (manual), HR 63   Seated with 2# ankle weight: -Hip flexion march 2 x 15 reps bilaterally with min VCs to increase ROM for better strengthening;  -LAQ 2 x 15 reps with cues for ankle DF to increase ROM and strength;  -BLE hip abduction 2 x 15; difficulty coordinating bilateral movement. *target (therapist hand) provided to encourage full ROM of LLE   Standing: Side stepping in parallel bars x10 feet x3 laps each direction with 2 HHA on railing; Required min VCs to keep feet oriented forward to avoid hip ER to isolate hip abductor strengthening;  BP: 104/56, HR 90   Gait throughout gym, 50 feet x 2 reps with RW and CGA for safety, MIN A provided during  turns for increased stabilizing; Patient ambulates using RW with uneven cadence and heavy step with RLE due to decreased weight shift to LLE due to LLE weakness from old CVA. He does exhibit increased foot drag on LLE with VC to increase foot clearance. Patient also requires cues to stay close to RW especially during turns for improved gait safety;   Required min A to direct hips towards seat for transfer to standard chair, wheelchair and NuStep for safety;      Clinical Impression: Patient motivated and participated well within session. He continues to have difficulty managing BP; wife states he is receiving medications as prescribed. Patient initial BP reading today was lower than typical. PT re-measured BP manually with slight increase. PT decided to attempt to increase BP with a light cardio warm-up; pt also encouraged to drink some water. BP continued to increase throughout session with final BP reading 104/56. Pt and wife were educated on BP, MAP and techniques to increase BP at home including hydration and increased activity level. PT also recommended pt try to see cardiologist or PCP sooner than next month. He demonstrates increased fatigue and compensation when performing left-sided exercises. Gait and transfers continue to require CGA-MIN A for safety and steadying. Patient would benefit from additional skilled PT intervention to improve strength, balance and mobility.           PT Short Term Goals - 10/28/20 0809       PT SHORT TERM GOAL #1   Title Pt will be independent with HEP to improve strength/mobility to improve independence with ADL's    Baseline 08/05/2020: Seated marches, long arc quads; 6/2:  reviewed HEP as pt not yet indep, 6/28: partial indep, does marches some but unable to recall others;    Time 6    Period Weeks    Status On-going    Target Date 11/13/20               PT Long Term Goals - 11/06/20 0847       PT LONG TERM GOAL #1   Title Pt will improve  5xSTS to < 15 sec for clinically significant increase in LE strength and decreased risk of falls.    Baseline 4/5: 20.77 sec with RUE support on chair rail; 6/1: 18.25 sec; 11/06/20: 13.44sec (RW ad lib)    Time 4    Period Weeks    Status Achieved    Target Date 11/25/20      PT LONG TERM GOAL #2   Title Pt will improve 10 m gait speed to > 0.8 m/s with LRAD to improve safety with household ambulation and decrease risk of falls.    Baseline 4/5: Deferred due to orthostasis; 11/06/20:  0.79m/s (RW)    Time 4    Period Weeks    Status On-going    Target Date 11/06/20      PT LONG TERM GOAL #3   Title Pt will improve Berg balance by 8 points as evidenced for age norms to display clinically significant reduced risk of falls for improved independence with ADL completion and walking tasks.    Baseline 4/5: deferred due to orthostasis; 6/1: 37/56; 6/28: 37/56    Time 4    Period Weeks    Status On-going    Target Date 11/25/20      PT LONG TERM GOAL #4   Title Pt will improve FOTO to target score equal or greater than 62 to maximize capcacity to improve independence with mobility    Baseline 4/5: 59/62; 6/1: 55; 6/28: 37/56    Time 4    Period Weeks    Status On-going    Target Date 11/25/20                   Plan - 11/20/20 1053     Clinical Impression Statement Patient motivated and participated well within session. He continues to have difficulty managing BP; wife states he is receiving medications as prescribed. Patient initial BP reading today was lower than typical. PT re-measured BP manually with slight increase. PT decided to attempt to increase BP with a light cardio warm-up; pt also encouraged to drink some water. BP continued to increase throughout session with final BP reading 104/56. Pt and wife were educated on BP, MAP and techniques to increase BP at home including hydration and increased activity level. PT also recommended pt try to see cardiologist or PCP sooner than  next month. He demonstrates increased fatigue and compensation when performing left-sided exercises. Gait and transfers continue to require CGA-MIN A for safety and steadying. Patient would benefit from additional skilled PT intervention to improve strength, balance and mobility.    Personal Factors and Comorbidities Age;Comorbidity 3+;Time since onset of injury/illness/exacerbation;Past/Current Experience    Examination-Activity Limitations Bathing;Bed Mobility;Squat;Stairs;Bend;Locomotion Level;Stand;Caring for Others;Reach Overhead;Transfers;Dressing    Examination-Participation Restrictions Church;Cleaning;Shop;Community Activity;Driving;Yard Work    Stability/Clinical Decision Making Unstable/Unpredictable    Rehab Potential Fair    PT Frequency 2x / week    PT Duration 4 weeks    PT Treatment/Interventions ADLs/Self Care Home Management;DME Instruction;Gait training;Stair training;Functional mobility training;Therapeutic activities;Therapeutic exercise;Balance training;Neuromuscular re-education;Patient/family education;Manual techniques;Passive range of motion;Energy conservation    PT Next Visit Plan FOTO: Continue to work on standing activity tolerance, strength, motor control for ADL safety    PT Home Exercise Plan No updates today    Consulted and Agree with Plan of Care Patient;Family member/caregiver    Family Member Consulted spouse             Patient will benefit from skilled therapeutic intervention in order to improve the following deficits and impairments:  Abnormal gait, Improper body mechanics, Decreased mobility, Postural dysfunction, Decreased activity tolerance, Decreased endurance, Decreased range of motion, Decreased strength, Decreased balance, Decreased safety awareness, Difficulty walking  Visit Diagnosis: Abnormality of gait and mobility  Difficulty in walking, not elsewhere classified  Muscle weakness (generalized)  Other lack of  coordination  Unsteadiness on feet  Other abnormalities of gait and mobility     Problem List There are no problems to display for this patient.  Basilia Jumbo PT, DPT  Lavenia Atlas 11/20/2020, 10:54 AM  [place] [facility] MAIN [facility] SERVICES [address] [place], [place], [address] Phone: [phone]   Fax:  [phone]  Name: Chad Patteelva J Faiola Sr. MRN: [id number] Date of Birth: June 15, 1933

## 2020-11-25 ENCOUNTER — Ambulatory Visit: Payer: Medicare Other | Admitting: Physical Therapy

## 2020-11-25 ENCOUNTER — Ambulatory Visit: Payer: Medicare Other

## 2020-11-27 ENCOUNTER — Ambulatory Visit: Payer: Medicare Other | Admitting: Physical Therapy

## 2020-11-27 ENCOUNTER — Encounter: Payer: Self-pay | Admitting: Physical Therapy

## 2020-11-27 ENCOUNTER — Other Ambulatory Visit: Payer: Self-pay

## 2020-11-27 DIAGNOSIS — M6281 Muscle weakness (generalized): Secondary | ICD-10-CM

## 2020-11-27 DIAGNOSIS — R269 Unspecified abnormalities of gait and mobility: Secondary | ICD-10-CM

## 2020-11-27 DIAGNOSIS — R2689 Other abnormalities of gait and mobility: Secondary | ICD-10-CM

## 2020-11-27 DIAGNOSIS — R2681 Unsteadiness on feet: Secondary | ICD-10-CM

## 2020-11-27 DIAGNOSIS — R278 Other lack of coordination: Secondary | ICD-10-CM

## 2020-11-27 DIAGNOSIS — R262 Difficulty in walking, not elsewhere classified: Secondary | ICD-10-CM

## 2020-11-27 NOTE — Therapy (Signed)
[place] MAIN [facility] SERVICES [address] [place], [place], [address] Phone: [phone]   Fax:  [phone]  Physical Therapy Treatment  Patient Details  Name: Chad Demarest Bernards Sr. MRN: [id number] Date of Birth: October 19, 1933 Referring Provider (PT): Dr. Ola Spurr   Encounter Date: 11/27/2020   PT End of Session - 11/27/20 0809     Visit Number 19    Number of Visits 33    Date for PT Re-Evaluation 12/25/20    Authorization Type Medicare [employer] (traditional)    Authorization Time Period 7/28-8-25    PT Start Time 0802    PT Stop Time 0845    PT Time Calculation (min) 43 min    Equipment Utilized During Treatment Gait belt    Activity Tolerance Patient tolerated treatment well;Patient limited by fatigue;Treatment limited secondary to medical complications (Comment)    Behavior During Therapy [facility] for tasks assessed/performed;Flat affect   cognitive deficits at baseline            Past Medical History:  Diagnosis Date   Coronary artery disease    Depression    Diabetes mellitus without complication ([place])    Seizures ([place])    Stroke ([place])    Vascular dementia ([place])     Past Surgical History:  Procedure Laterality Date   CARDIAC SURGERY      There were no vitals filed for this visit.   Subjective Assessment - 11/27/20 0809     Subjective Pt reports he is doing well today. Wife reports pt is taking medications including Midodrine as it was prescribed. She and pt deny any recent falls. Pt also denies pain.    Pertinent History Pt is a 87 y.o. male referred to PT for balance and gait due to recurrent falls. He presents with a PMH of: CAD, depression, DM, seizures, CVA, vascular dementia. 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 Pt relies heavily on spouse for history due to baseline cognitive defcits due to his vascular dementia. Hx of orthostatics due to poor liquid intake likely due to his vascular dementia in the past. Pt  with spouse at eval and states his referral is for gait and imbalance. Pt and spouse reports on average he was having 1 fall/week approximately with no specific reason for his falls. Pt relies on chair or bed to stand up from or calls to neighbors to assist in returning pt upright. Pt and spouse repotr pt did have significant fall at the end of january leading to a L shoulder fracture. Unable to specify if any weightbearing precautions. Denies falls at night in low light and a decrease of activity levels since not going to adult daycare due to covid 19 concerns. History of CVA that affected his L side with reported weakness which pt's spouse is curious if this could be affecting his balance. Spouse assists with ADL's such as dressing, bathing, cooking, cleaning. Pt and spouse reports pt spends most of his ambulation at the home inside. Pt's goal is to stop falling and improve his walking.    Currently in Pain? No/denies    Multiple Pain Sites No                [facility] PT Assessment - 11/27/20 0001       Berg Balance Test   Sit to Stand Able to stand without using hands and stabilize independently    Standing Unsupported Able to stand 2 minutes with supervision  Sitting with Back Unsupported but Feet Supported on Floor or Stool Able to sit safely and securely 2 minutes    Stand to Sit Sits safely with minimal use of hands    Transfers Able to transfer safely, definite need of hands    Standing Unsupported with Eyes Closed Able to stand 10 seconds with supervision    Standing Unsupported with Feet Together Able to place feet together independently but unable to hold for 30 seconds    From Standing, Reach Forward with Outstretched Arm Can reach forward >12 cm safely (5")    From Standing Position, Pick up Object from Floor Able to pick up shoe, needs supervision    From Standing Position, Turn to Look Behind Over each Shoulder Looks behind from both sides and weight shifts well    Turn 360  Degrees Needs assistance while turning    Standing Unsupported, Alternately Place Feet on Step/Stool Able to complete >2 steps/needs minimal assist    Standing Unsupported, One Foot in Front Able to take small step independently and hold 30 seconds    Standing on One Leg Tries to lift leg/unable to hold 3 seconds but remains standing independently    Total Score 37    Berg comment: High risk for falls           TREATMENT: PT assessed patient's BP: 105/48 in sitting  Instructed patient in seated exercise as warm up and to increase AROM: Seated: -Hip flexion march x15 reps with visual cues to increase ROM -LAQ with ankle DF with min Vcs to increase ankle DF for increased stretch in posterior leg x15 reps each LE;  -Hamstring curl with red tband x15 reps each LE:  Instructed patient in outcome measures to address progress towards goals:   BP after 10 meter walk: 109/47, MAP 63   TEST Outcome 08/05/20  Outcome 10/01/20  Outcome 10/28/20 Outcome 11/27/20 Interpretation  5 times sit<>stand 20.77 sec   18.25 sec   7.37 sec without arms 11.3 sec without arms >60 yo, >15 sec indicates increased risk for falls  10 meter walk test        Deferred     0.77 m/s (13 sec) with RW 0.69 m/s (14.5 sec) with RW <1.0 m/s indicates increased risk for falls; limited community ambulator  Berg Balance Assessment      29/56 (09/02/20)   37/56   37/56 37/56 <36/56 (100% risk for falls), 37-45 (80% risk for falls); 46-51 (>50% risk for falls); 52-55 (lower risk <25% of falls)  FOTO 59/62   55%   49% 51%  Pt self reported increased difficulty with transfers, walking around home and negotiating curbs compared to intake; However when questioned he reports that he thinks he's doing better                            Patient tolerated session well. He denies any symptoms at end of session. Patient exhibits no significant change in mobility and safety as evidenced by outcome measures. He has missed several appointments due  to conflicts which has likely limited his progress. In addition he has had difficulty managing BP with chronic low BP which has limited standing tolerance activities.                      PT Education - 11/27/20 0809     Education Details exercise/positioning;    Person(s) Educated Patient  Methods Explanation;Verbal cues    Comprehension Verbalized understanding;Returned demonstration;Verbal cues required;Need further instruction              PT Short Term Goals - 11/27/20 0810       PT SHORT TERM GOAL #1   Title Pt will be independent with HEP to improve strength/mobility to improve independence with ADL's    Baseline 08/05/2020: Seated marches, long arc quads; 6/2:  reviewed HEP as pt not yet indep, 6/28: partial indep, does marches some but unable to recall others; 7/28: doing some extra walking, not doing formal HEP    Time 4    Period Weeks    Status Not Met    Target Date 12/25/20               PT Long Term Goals - 11/27/20 0810       PT LONG TERM GOAL #1   Title Pt will improve 5xSTS to < 15 sec for clinically significant increase in LE strength and decreased risk of falls.    Baseline 4/5: 20.77 sec with RUE support on chair rail; 6/1: 18.25 sec; 11/06/20: 13.44sec (RW ad lib), 7/28: 11.3 sec    Time 4    Period Weeks    Status Achieved    Target Date 12/25/20      PT LONG TERM GOAL #2   Title Pt will improve 10 m gait speed to > 0.8 m/s with LRAD to improve safety with household ambulation and decrease risk of falls.    Baseline 4/5: Deferred due to orthostasis; 11/06/20: 0.95ms (RW) 7/28: 0.69 m/s with RW    Time 4    Period Weeks    Status On-going    Target Date 12/25/20      PT LONG TERM GOAL #3   Title Pt will improve Berg balance by 8 points as evidenced for age norms to display clinically significant reduced risk of falls for improved independence with ADL completion and walking tasks.    Baseline 4/5: deferred due to orthostasis;  6/1: 37/56; 6/28: 37/56, 7/28: 37/56    Time 4    Period Weeks    Status On-going    Target Date 12/25/20      PT LONG TERM GOAL #4   Title Pt will improve FOTO to target score equal or greater than 62 to maximize capcacity to improve independence with mobility    Baseline 4/5: 59/62; 6/1: 55; 7/28: 51%    Time 4    Period Weeks    Status On-going    Target Date 12/25/20                   Plan - 11/27/20 0820     Clinical Impression Statement Patient motivated and participated well within session. He continues to have low BP, but states he has been taking Midodrine appropriately as prescribed (2x a day). Patient denies any symptoms. He reports no recent falls. PT instructed patient in outcome measures. He exhibits no significant change in mobility or safety as evidenced by outcome meaures. However this could be due to patient missing several appointments. He has also had a hard time managing his BP with continued low BP. PT reached out to notify cardiologist and PCP. Patient reports he is taking his prescription as prescribed. He is supposed to follow up with cardiologist next month. PT has been limited to more sitting exercise due to concerns over low BP. This has also limited progress. He would benefit from  additional skilled PT Intervention to improve strength, balance and mobility; Reinforced importance of HEP adherence;    Personal Factors and Comorbidities Age;Comorbidity 3+;Time since onset of injury/illness/exacerbation;Past/Current Experience    Examination-Activity Limitations Bathing;Bed Mobility;Squat;Stairs;Bend;Locomotion Level;Stand;Caring for Others;Reach Overhead;Transfers;Dressing    Examination-Participation Restrictions Church;Cleaning;Shop;Community Activity;Driving;Yard Work    Stability/Clinical Decision Making Unstable/Unpredictable    Rehab Potential Fair    PT Frequency 2x / week    PT Duration 4 weeks    PT Treatment/Interventions ADLs/Self Care Home  Management;DME Instruction;Gait training;Stair training;Functional mobility training;Therapeutic activities;Therapeutic exercise;Balance training;Neuromuscular re-education;Patient/family education;Manual techniques;Passive range of motion;Energy conservation    PT Next Visit Plan FOTO: Continue to work on standing activity tolerance, strength, motor control for ADL safety    PT Home Exercise Plan No updates today    Consulted and Agree with Plan of Care Patient;Family member/caregiver    Family Member Consulted spouse             Patient will benefit from skilled therapeutic intervention in order to improve the following deficits and impairments:  Abnormal gait, Improper body mechanics, Decreased mobility, Postural dysfunction, Decreased activity tolerance, Decreased endurance, Decreased range of motion, Decreased strength, Decreased balance, Decreased safety awareness, Difficulty walking  Visit Diagnosis: Abnormality of gait and mobility  Difficulty in walking, not elsewhere classified  Muscle weakness (generalized)  Other lack of coordination  Unsteadiness on feet  Other abnormalities of gait and mobility     Problem List There are no problems to display for this patient.   Dulcinea Kinser PT, DPT 11/27/2020, 10:45 AM  [place] MAIN [facility] SERVICES [address] [place], [place], [address] Phone: [phone]   Fax:  [phone]  Name: Chad Rakes [facility] Sr. MRN: [id number] Date of Birth: 10-May-1933

## 2020-12-01 ENCOUNTER — Ambulatory Visit: Payer: Medicare Other | Attending: Infectious Diseases | Admitting: Physical Therapy

## 2020-12-01 DIAGNOSIS — R278 Other lack of coordination: Secondary | ICD-10-CM | POA: Insufficient documentation

## 2020-12-01 DIAGNOSIS — R2689 Other abnormalities of gait and mobility: Secondary | ICD-10-CM | POA: Insufficient documentation

## 2020-12-01 DIAGNOSIS — M6281 Muscle weakness (generalized): Secondary | ICD-10-CM | POA: Insufficient documentation

## 2020-12-01 DIAGNOSIS — R2681 Unsteadiness on feet: Secondary | ICD-10-CM | POA: Insufficient documentation

## 2020-12-01 DIAGNOSIS — R262 Difficulty in walking, not elsewhere classified: Secondary | ICD-10-CM | POA: Insufficient documentation

## 2020-12-01 DIAGNOSIS — R269 Unspecified abnormalities of gait and mobility: Secondary | ICD-10-CM | POA: Insufficient documentation

## 2020-12-04 ENCOUNTER — Ambulatory Visit: Payer: Medicare Other | Admitting: Physical Therapy

## 2020-12-09 ENCOUNTER — Ambulatory Visit: Payer: Medicare Other | Admitting: Physical Therapy

## 2020-12-11 ENCOUNTER — Ambulatory Visit: Payer: Medicare Other | Admitting: Physical Therapy

## 2020-12-16 ENCOUNTER — Ambulatory Visit: Payer: Medicare Other | Admitting: Physical Therapy

## 2020-12-18 ENCOUNTER — Ambulatory Visit: Payer: Medicare Other | Admitting: Physical Therapy

## 2020-12-23 ENCOUNTER — Ambulatory Visit: Payer: Medicare Other | Admitting: Physical Therapy

## 2020-12-25 ENCOUNTER — Ambulatory Visit: Payer: Medicare Other | Admitting: Physical Therapy

## 2020-12-30 ENCOUNTER — Ambulatory Visit: Payer: Medicare Other | Admitting: Physical Therapy

## 2021-01-01 ENCOUNTER — Ambulatory Visit: Payer: Medicare Other | Admitting: Physical Therapy

## 2021-01-06 ENCOUNTER — Ambulatory Visit: Payer: Medicare Other | Admitting: Physical Therapy

## 2021-01-08 ENCOUNTER — Ambulatory Visit: Payer: Medicare Other | Admitting: Physical Therapy

## 2021-01-13 ENCOUNTER — Ambulatory Visit: Payer: Medicare Other | Admitting: Physical Therapy

## 2021-01-15 ENCOUNTER — Ambulatory Visit: Payer: Medicare Other | Admitting: Physical Therapy

## 2021-01-20 ENCOUNTER — Ambulatory Visit: Payer: Medicare Other | Admitting: Physical Therapy

## 2021-01-22 ENCOUNTER — Ambulatory Visit: Payer: Medicare Other | Admitting: Physical Therapy

## 2021-01-27 ENCOUNTER — Ambulatory Visit: Payer: Medicare Other | Admitting: Physical Therapy

## 2021-01-29 ENCOUNTER — Ambulatory Visit: Payer: Medicare Other | Admitting: Physical Therapy

## 2021-06-23 ENCOUNTER — Emergency Department: Payer: Medicare Other

## 2021-06-23 ENCOUNTER — Inpatient Hospital Stay
Admission: EM | Admit: 2021-06-23 | Discharge: 2021-06-25 | DRG: 179 | Disposition: A | Discharge status: Home Health | Payer: Medicare Other | Attending: Internal Medicine | Admitting: Internal Medicine

## 2021-06-23 ENCOUNTER — Other Ambulatory Visit: Payer: Self-pay

## 2021-06-23 DIAGNOSIS — Z79899 Other long term (current) drug therapy: Secondary | ICD-10-CM

## 2021-06-23 DIAGNOSIS — R531 Weakness: Secondary | ICD-10-CM

## 2021-06-23 DIAGNOSIS — R296 Repeated falls: Secondary | ICD-10-CM | POA: Diagnosis present

## 2021-06-23 DIAGNOSIS — Z20822 Contact with and (suspected) exposure to covid-19: Secondary | ICD-10-CM | POA: Diagnosis present

## 2021-06-23 DIAGNOSIS — I2510 Atherosclerotic heart disease of native coronary artery without angina pectoris: Secondary | ICD-10-CM | POA: Diagnosis present

## 2021-06-23 DIAGNOSIS — J189 Pneumonia, unspecified organism: Secondary | ICD-10-CM | POA: Insufficient documentation

## 2021-06-23 DIAGNOSIS — Z9861 Coronary angioplasty status: Secondary | ICD-10-CM | POA: Diagnosis not present

## 2021-06-23 DIAGNOSIS — F01C0 Vascular dementia, severe, without behavioral disturbance, psychotic disturbance, mood disturbance, and anxiety: Secondary | ICD-10-CM | POA: Diagnosis not present

## 2021-06-23 DIAGNOSIS — J690 Pneumonitis due to inhalation of food and vomit: Secondary | ICD-10-CM | POA: Diagnosis present

## 2021-06-23 DIAGNOSIS — Z87891 Personal history of nicotine dependence: Secondary | ICD-10-CM | POA: Diagnosis not present

## 2021-06-23 DIAGNOSIS — J180 Bronchopneumonia, unspecified organism: Secondary | ICD-10-CM | POA: Diagnosis present

## 2021-06-23 DIAGNOSIS — F0150 Vascular dementia without behavioral disturbance: Secondary | ICD-10-CM

## 2021-06-23 DIAGNOSIS — I255 Ischemic cardiomyopathy: Secondary | ICD-10-CM | POA: Diagnosis present

## 2021-06-23 DIAGNOSIS — F32A Depression, unspecified: Secondary | ICD-10-CM | POA: Diagnosis present

## 2021-06-23 DIAGNOSIS — Z8673 Personal history of transient ischemic attack (TIA), and cerebral infarction without residual deficits: Secondary | ICD-10-CM

## 2021-06-23 DIAGNOSIS — E119 Type 2 diabetes mellitus without complications: Secondary | ICD-10-CM

## 2021-06-23 LAB — RESP PANEL BY RT-PCR (FLU A&B, COVID) ARPGX2
Influenza A by PCR: NEGATIVE
Influenza B by PCR: NEGATIVE
SARS Coronavirus 2 by RT PCR: NEGATIVE

## 2021-06-23 LAB — CBC
HCT: 36.4 % — ABNORMAL LOW (ref 39.0–52.0)
Hemoglobin: 11.8 g/dL — ABNORMAL LOW (ref 13.0–17.0)
MCH: 31.1 pg (ref 26.0–34.0)
MCHC: 32.4 g/dL (ref 30.0–36.0)
MCV: 95.8 fL (ref 80.0–100.0)
Platelets: 215 10*3/uL (ref 150–400)
RBC: 3.8 MIL/uL — ABNORMAL LOW (ref 4.22–5.81)
RDW: 12.2 % (ref 11.5–15.5)
WBC: 10 10*3/uL (ref 4.0–10.5)
nRBC: 0 % (ref 0.0–0.2)

## 2021-06-23 LAB — HEPATIC FUNCTION PANEL
ALT: 19 U/L (ref 0–44)
AST: 27 U/L (ref 15–41)
Albumin: 3.5 g/dL (ref 3.5–5.0)
Alkaline Phosphatase: 96 U/L (ref 38–126)
Bilirubin, Direct: 0.3 mg/dL — ABNORMAL HIGH (ref 0.0–0.2)
Indirect Bilirubin: 0.7 mg/dL (ref 0.3–0.9)
Total Bilirubin: 1 mg/dL (ref 0.3–1.2)
Total Protein: 6.8 g/dL (ref 6.5–8.1)

## 2021-06-23 LAB — BASIC METABOLIC PANEL
Anion gap: 9 (ref 5–15)
BUN: 21 mg/dL (ref 8–23)
CO2: 27 mmol/L (ref 22–32)
Calcium: 9 mg/dL (ref 8.9–10.3)
Chloride: 100 mmol/L (ref 98–111)
Creatinine, Ser: 1.09 mg/dL (ref 0.61–1.24)
GFR, Estimated: >60 mL/min (ref >60)
Glucose, Bld: 125 mg/dL — ABNORMAL HIGH (ref 70–99)
Potassium: 4.7 mmol/L (ref 3.5–5.1)
Sodium: 136 mmol/L (ref 135–145)

## 2021-06-23 LAB — LACTIC ACID, PLASMA
Lactic Acid, Venous: 0.7 mmol/L (ref 0.5–1.9)
Lactic Acid, Venous: 0.8 mmol/L (ref 0.5–1.9)

## 2021-06-23 LAB — URINALYSIS, ROUTINE W REFLEX MICROSCOPIC
Bacteria, UA: NONE SEEN
Bilirubin Urine: NEGATIVE
Glucose, UA: NEGATIVE mg/dL
Ketones, ur: NEGATIVE mg/dL
Leukocytes,Ua: NEGATIVE
Nitrite: NEGATIVE
Protein, ur: NEGATIVE mg/dL
Specific Gravity, Urine: 1.011 (ref 1.005–1.030)
Squamous Epithelial / HPF: NONE SEEN (ref 0–5)
pH: 5 (ref 5.0–8.0)

## 2021-06-23 LAB — TROPONIN I (HIGH SENSITIVITY)
Troponin I (High Sensitivity): 12 ng/L (ref <18)
Troponin I (High Sensitivity): 12 ng/L (ref <18)

## 2021-06-23 LAB — PROCALCITONIN: Procalcitonin: <0.1 ng/mL

## 2021-06-23 LAB — BRAIN NATRIURETIC PEPTIDE: B Natriuretic Peptide: 169.3 pg/mL — ABNORMAL HIGH (ref 0.0–100.0)

## 2021-06-23 MED ORDER — MIDODRINE HCL 5 MG PO TABS
2.5000 mg | ORAL_TABLET | Freq: Two times a day (BID) | ORAL | Status: DC
  Administered 2021-06-24 – 2021-06-25 (×3): 2.5 mg via ORAL
  Filled 2021-06-23 (×3): qty 1

## 2021-06-23 MED ORDER — SODIUM CHLORIDE 0.9 % IV SOLN
2.0000 g | INTRAVENOUS | Status: DC
  Administered 2021-06-24: 2 g via INTRAVENOUS
  Filled 2021-06-23: qty 2
  Filled 2021-06-23: qty 20

## 2021-06-23 MED ORDER — ATORVASTATIN CALCIUM 20 MG PO TABS
40.0000 mg | ORAL_TABLET | Freq: Every day | ORAL | Status: DC
  Administered 2021-06-23 – 2021-06-24 (×2): 40 mg via ORAL
  Filled 2021-06-23 (×2): qty 2

## 2021-06-23 MED ORDER — ONDANSETRON HCL 4 MG/2ML IJ SOLN: 4.0000 mg | Freq: Four times a day (QID) | INTRAMUSCULAR | Status: DC | PRN

## 2021-06-23 MED ORDER — LACTATED RINGERS IV SOLN: INTRAVENOUS | Status: DC

## 2021-06-23 MED ORDER — SODIUM CHLORIDE 0.9 % IV SOLN
500.0000 mg | Freq: Once | INTRAVENOUS | Status: AC
  Administered 2021-06-23: 500 mg via INTRAVENOUS
  Filled 2021-06-23: qty 5

## 2021-06-23 MED ORDER — ENOXAPARIN SODIUM 40 MG/0.4ML IJ SOSY
40.0000 mg | PREFILLED_SYRINGE | INTRAMUSCULAR | Status: DC
  Administered 2021-06-23 – 2021-06-24 (×2): 40 mg via SUBCUTANEOUS
  Filled 2021-06-23 (×2): qty 0.4

## 2021-06-23 MED ORDER — FINASTERIDE 5 MG PO TABS
5.0000 mg | ORAL_TABLET | Freq: Every day | ORAL | Status: DC
  Administered 2021-06-23 – 2021-06-25 (×3): 5 mg via ORAL
  Filled 2021-06-23 (×3): qty 1

## 2021-06-23 MED ORDER — LACTATED RINGERS IV BOLUS
1000.0000 mL | Freq: Once | INTRAVENOUS | Status: DC
  Administered 2021-06-23: 1000 mL via INTRAVENOUS

## 2021-06-23 MED ORDER — SODIUM CHLORIDE 0.9 % IV SOLN
2.0000 g | Freq: Once | INTRAVENOUS | Status: AC
  Administered 2021-06-23: 2 g via INTRAVENOUS
  Filled 2021-06-23: qty 20

## 2021-06-23 MED ORDER — GUAIFENESIN-DM 100-10 MG/5ML PO SYRP: 10.0000 mL | ORAL_SOLUTION | Freq: Four times a day (QID) | ORAL | Status: DC | PRN

## 2021-06-23 MED ORDER — DONEPEZIL HCL 5 MG PO TABS
10.0000 mg | ORAL_TABLET | Freq: Every day | ORAL | Status: DC
  Administered 2021-06-23 – 2021-06-24 (×2): 10 mg via ORAL
  Filled 2021-06-23 (×2): qty 2

## 2021-06-23 MED ORDER — ISOSORBIDE MONONITRATE ER 30 MG PO TB24
30.0000 mg | ORAL_TABLET | Freq: Every day | ORAL | Status: DC
  Administered 2021-06-24 – 2021-06-25 (×2): 30 mg via ORAL
  Filled 2021-06-23 (×2): qty 1

## 2021-06-23 MED ORDER — SODIUM CHLORIDE 0.9 % IV SOLN
500.0000 mg | INTRAVENOUS | Status: DC
  Administered 2021-06-24: 500 mg via INTRAVENOUS
  Filled 2021-06-23 (×2): qty 5

## 2021-06-23 MED ORDER — SENNOSIDES-DOCUSATE SODIUM 8.6-50 MG PO TABS: 1.0000 | ORAL_TABLET | Freq: Every evening | ORAL | Status: DC | PRN

## 2021-06-23 MED ORDER — ONDANSETRON HCL 4 MG PO TABS: 4.0000 mg | ORAL_TABLET | Freq: Four times a day (QID) | ORAL | Status: DC | PRN

## 2021-06-23 MED ORDER — ALBUTEROL SULFATE (2.5 MG/3ML) 0.083% IN NEBU: 2.5000 mg | INHALATION_SOLUTION | RESPIRATORY_TRACT | Status: DC | PRN

## 2021-06-23 MED ORDER — ACETAMINOPHEN 325 MG PO TABS
650.0000 mg | ORAL_TABLET | Freq: Four times a day (QID) | ORAL | Status: DC | PRN
  Administered 2021-06-24: 650 mg via ORAL
  Filled 2021-06-23: qty 2

## 2021-06-23 MED ORDER — ACETAMINOPHEN 650 MG RE SUPP: 650.0000 mg | Freq: Four times a day (QID) | RECTAL | Status: DC | PRN

## 2021-06-23 NOTE — Assessment & Plan Note (Addendum)
Patient has been seen by physical therapy, recommended home PT. will set up home care with PT.

## 2021-06-23 NOTE — H&P (Signed)
History and Physical    Patient: Chad ONDERKO Sr. [id number] DOB: Aug 18, 1933 DOA: 06/23/2021 DOS: the patient was seen and examined on 06/23/2021 PCP: Anastasio Champion, MD  Patient coming from: Home  Chief Complaint:  Chief Complaint  Patient presents with   Weakness    HPI: Chad Eymard Herbst Sr. is a 88 y.o. male with medical history significant of CAD, DMT2 diet controlled, vascular dementia, previous stroke, chronic hypotension who presents to the emergency room with increasing weakness over the last few days.  Ported to the emergency room physician that patient has had difficulty ambulating at home and has fallen several times.  There is no reported loss of consciousness.  Normally is able to ambulate with a cane to support himself but the last few days he has not been able to even do that.  Wife reports he has been he has been slightly more confused than his baseline.  He has had decreased appetite and decreased p.o. intake.  Wife is no longer at the bedside.  History was obtained by the ER physician.  He states he has not had any fevers and he has no complaints at this time.  He does state he occasionally has a cough.  He does not remember falling.  He denies any chest pain or palpitations.  Review of Systems: Accurate review of systems difficult to obtain as patient has dementia.  Patient denies any pain fever.  He does state he has a mild cough.  Reportedly he had generalized weakness and a fall at home but patient does not remember this. Past Medical History:  Diagnosis Date   Coronary artery disease    Depression    Diabetes mellitus without complication ([place])    Seizures ([place])    Stroke ([place])    Vascular dementia [facility])    Past Surgical History:  Procedure Laterality Date   CARDIAC SURGERY     Social History:  reports that he has quit smoking. He has never used smokeless tobacco. He reports that he does not drink alcohol. No history on file for drug use.  No Known  Allergies  History reviewed. No pertinent family history.  Prior to Admission medications   Medication Sig Start Date End Date Taking? Authorizing Provider  atorvastatin (LIPITOR) 40 MG tablet Take 40 mg by mouth daily.    [provider]  donepezil (ARICEPT) 10 MG tablet Take 10 mg by mouth at bedtime.    [provider]  finasteride (PROSCAR) 5 MG tablet Take 5 mg by mouth daily.    [provider]  isosorbide mononitrate (IMDUR) 30 MG 24 hr tablet Take 30 mg by mouth daily.    [provider]  lamoTRIgine (LAMICTAL) 200 MG tablet Take 200 mg by mouth daily.    [provider]  midodrine (PROAMATINE) 2.5 MG tablet Take 2.5 mg by mouth 2 (two) times daily. "Take 1 tablet 2x daily" per cardiology notes. 08/14/20 08/14/21  Callwood, Dwayne D, MD  sertraline (ZOLOFT) 50 MG tablet Take 50 mg by mouth daily.    [provider]    Physical Exam: Vitals:   06/23/21 0928 06/23/21 1120 06/23/21 1310  BP: (!) 100/57 105/69 110/62  Pulse: 66 69 70  Resp: 18 17 17   Temp: (!) 97.5 F (36.4 C) 98.3 F (36.8 C) 98.1 F (36.7 C)  TempSrc: Oral Oral Oral  SpO2: 90% 96% 96%   General: WDWN, Alert and oriented to self and place.  Eyes: EOMI, PERRL, conjunctivae  normal.  Sclera nonicteric HENT:  [place]/AT, external ears normal.  Nares patent without epistasis.  Mucous membranes are dry. Posterior pharynx clear of any exudate  Neck: Soft, normal range of motion, supple, no masses,  Trachea midline Respiratory: Equal breath sounds with mild decrease in right base. Rhonchi in right lower lobe. no wheezing, no crackles. Normal respiratory effort. No accessory muscle use.  Cardiovascular: Regular rate and rhythm, no murmurs / rubs / gallops. No extremity edema. 1+ pedal pulses. Abdomen: Soft, no tenderness, nondistended, no rebound or guarding.  No masses palpated. Bowel sounds normoactive Musculoskeletal: FROM. no cyanosis. No joint deformity upper and  lower extremities. Normal muscle tone.  Skin: Warm, dry, intact no rashes, lesions, ulcers. No induration Neurologic: CN 2-12 grossly intact.  Normal speech.  Sensation intact, patella DTR +1 bilaterally.  Psychiatric: Normal mood.    Data Reviewed: Lab work: WBC 10,000 hemoglobin 11.8 hematocrit 36.4 platelets 215,000 procalcitonin less than 0.10 lactic acid 0.7 troponin 12 BNP 169.3 CMP unremarkable   urinalysis clear yellow with negative leukocyte and negative nitrites   COVID-negative   Influenza A and B negative  Chest x-ray shows no acute cardiopulmonary abnormalities.  Patient is status post CABG CT head negative for acute intracranial pathology.  Patient with previous right MCA infarct with encephalomalacia and atrophy CT cervical spine shows multilevel degenerative changes no acute fracture Diffuse mild bronchial wall thickening  EKG has a poor baseline and is read as A-fib by the computer with nonspecific ST changes.  QTc 424.  Patient is currently normal sinus rhythm with visible P waves on the monitor.  Will repeat EKG  Assessment and Plan: * CAP (community acquired pneumonia)- (present on admission) [name] is admitted to medical telemetry with CAP in right lower lobe.  Started on Rocephin and azithromycin for antibiotic coverage. Incentive spirometer every two hours while awake Cultures obtained in Er and will be monitored Supplemental oxygen to keep O2 sat 92-96% if it is needed.  Check CBC, BMP in am IVF hydration with LR.  Robitussin dm for cough as needed.  Diabetes mellitus type 2, uncomplicated ([place]) DM is diet controlled. Check HgbA1c Monitor blood glucose  Generalized weakness PT evaluation in am to determine if home health; rehab is required. Fall precautions.  CAD S/P percutaneous coronary angioplasty Continue Imdur and Lipitor.  No chest pain Initial EKG stated a-fib but had poor baseline. Recheck EKG now.  Vascular dementia ([place]) Chronic. On  aricept  Advance Care Planning:  Code Status:  Full Code.    Lovenox for DVT prophylaxis.   Family Communication: No family at bedside. Further recommendations to follow as clinically indicated  Author: Eben Burow, MD 06/23/2021 3:16 PM  For on call review [email or web address].

## 2021-06-23 NOTE — Assessment & Plan Note (Addendum)
Follow-up with PCP as outpatient.  Not tight control as needed due to advanced age and poor long-term prognosis

## 2021-06-23 NOTE — ED Provider Notes (Signed)
[facility] Provider Note    Event Date/Time   First MD Initiated Contact with Patient 06/23/21 [phone]     (approximate)   History   Weakness   HPI  Chad Tapp Loudon Sr. is a 88 y.o. male with past medical history of ischemic cardiomyopathy, chronic hypotension, diabetes, dementia, here with weakness.  History provided primarily by his wife who is his primary caregiver.  Per report, the patient has been increasingly weak over the last several days.  He has had difficulty ambulating and has actually fallen several times.  He is normally able to support himself somewhat with a cane and assistance but has been unable to do this.  He has been slightly more confused.  He has been eating and drinking less.  He has history of recurrent dehydration with somewhat similar presentations, and he is continued his diuretics.  She feels like his skin has been dry.  Denies any known fevers.  He had a mild cough which is new.  No other medical complaints.  No known fevers.  No known COVID exposures.     Physical Exam   Triage Vital Signs: ED Triage Vitals  Enc Vitals Group     BP 06/23/21 0928 (!) 100/57     Pulse Rate 06/23/21 0928 66     Resp 06/23/21 0928 18     Temp 06/23/21 0928 (!) 97.5 F (36.4 C)     Temp Source 06/23/21 0928 Oral     SpO2 06/23/21 0928 90 %     Weight --      Height --      Head Circumference --      Peak Flow --      Pain Score 06/23/21 0927 0     Pain Loc --      Pain Edu? --      Excl. in GC? --     Most recent vital signs: Vitals:   06/23/21 0928  BP: (!) 100/57  Pulse: 66  Resp: 18  Temp: (!) 97.5 F (36.4 C)  SpO2: 90%     General: Awake, no distress.  Chronically ill-appearing. CV:  Good peripheral perfusion.  Regular rate and rhythm Resp:  Slight basilar rales with mild wheezing diffusely, mild tachypnea noted. Abd:  No distention.  No tenderness. Other:  Poor skin turgor, no lower extremity edema noted.   ED Results  / Procedures / Treatments   Labs (all labs ordered are listed, but only abnormal results are displayed) Labs Reviewed  CULTURE, BLOOD (SINGLE)  RESP PANEL BY RT-PCR (FLU A&B, COVID) ARPGX2  BASIC METABOLIC PANEL  CBC  URINALYSIS, ROUTINE W REFLEX MICROSCOPIC  HEPATIC FUNCTION PANEL  LACTIC ACID, PLASMA  LACTIC ACID, PLASMA  PROCALCITONIN  CBG MONITORING, ED     EKG Atrial fibrillation, ventricular rate 62.  QRS 72, QTc 424.  No acute ST elevations or depressions.  No EKG evidence of acute ischemia or infarct.   RADIOLOGY CT chest: Right lower lobe bronchopneumonia, possible pulmonary fibrosis Chest x-ray: Clear CT head: No acute intracranial abnormality, prior right MCA infarct CT cervical spine: No acute fracture or malalignment.   I also independently reviewed and agree wit radiologist interpretations.   PROCEDURES:  Critical Care performed: No  .1-3 Lead EKG Interpretation Performed by: Shaune Pollack, MD Authorized by: Shaune Pollack, MD     Interpretation: normal     ECG rate:  70-90   ECG rate assessment: normal     Rhythm: sinus  rhythm     Ectopy: none     Conduction: normal   Comments:     Indication: SOB    MEDICATIONS ORDERED IN ED: Medications  lactated ringers bolus 1,000 mL (has no administration in time range)     IMPRESSION / MDM / ASSESSMENT AND PLAN / ED COURSE  I reviewed the triage vital signs and the nursing notes.                               The patient is on the cardiac monitor to evaluate for evidence of arrhythmia and/or significant heart rate changes.   MDM:  88  yo M with PMHx CAD, DM2, vascular dementia, chronic hypotension here with weakness. Pt has bilateral rhonchi, rales on exam and history is concerning for PNA. CXR ,however, initially read as clear. Pt borderline hypoxic and tachypneic. Labs overall reassuring. No significant leukocytosis and LA is normal. Renal function is normal. BNP slightly elevated but he  does not appear hypervolemic clinically. EKG nonischemic, no signs of ACS. COVID, influenza negative. LFTs are normal. CT Head/C Spine reviewed by me and are unremarkable.  Given his lung exam and report of cough/SOB, CT Chest obtained and is remarkable for bronchopna with possible underlying ILD. This is consistent with his exam. Will start on empiric ABX, admit to medicine given his age, inability to ambulate 2/2 weakness.   MEDICATIONS GIVEN IN ED: Medications  lactated ringers bolus 1,000 mL (has no administration in time range)     Consults:  Hospitalist Dr. Rachael Darby   EMR reviewed  PCP visit 04/2021 noting his chronic hypotension, ischemic cardiomyopathy, diabetes, and dementia as well as history of stroke Echocardiogram in care everywhere showing ejection fraction of 50%,    FINAL CLINICAL IMPRESSION(S) / ED DIAGNOSES   Final diagnoses:  None     Rx / DC Orders   ED Discharge Orders     None        Note:  This document was prepared using Dragon voice recognition software and may include unintentional dictation errors.   Shaune Pollack, MD 06/23/21 [phone]

## 2021-06-23 NOTE — Assessment & Plan Note (Addendum)
Condition is stable

## 2021-06-23 NOTE — Assessment & Plan Note (Addendum)
Patient does not have any hypoxemia.  I reviewed chest x-ray, personally reviewed chest x-ray image, patient has a right lower lobe infiltrates.  But procalcitonin level negative.  Patient probably has aspiration pneumonia.  Discussed with speech therapist, he does not appear to have any overt aspiration.  However, he had a significant rhonchi and the right lower lobe after eating breakfast, concerning for silent aspiration.  Will obtain modified barium swallow before discharge. We will continue antibiotics with Rocephin and Zithromax for now.  He may not need any antibiotics at time of discharge.

## 2021-06-23 NOTE — ED Triage Notes (Addendum)
Pt come with c/o increased weakness since last weekend. Pt appears pale. Pt assisted out of vehicle and very weak. Pt is possibly dehydrated per family or maybe UTI. Pt does have foul urine odor.   Pt has had multiple falls per family. Pt is not on thinners. Pt has labored breathing but denies any SOB.

## 2021-06-23 NOTE — ED Notes (Signed)
Pts wife came to nurse's station again, asking questions. This RN answered to the best of her ability & explained the process of things in the ER. This RN let wife know that, once pts nurse is out of another room, she will let him know that she has some questions.

## 2021-06-23 NOTE — ED Notes (Signed)
Informed RN bed assigned 

## 2021-06-23 NOTE — Assessment & Plan Note (Addendum)
Continue Aricept 

## 2021-06-24 ENCOUNTER — Inpatient Hospital Stay: Payer: Medicare Other

## 2021-06-24 DIAGNOSIS — J189 Pneumonia, unspecified organism: Secondary | ICD-10-CM | POA: Diagnosis not present

## 2021-06-24 DIAGNOSIS — R531 Weakness: Secondary | ICD-10-CM

## 2021-06-24 DIAGNOSIS — E119 Type 2 diabetes mellitus without complications: Secondary | ICD-10-CM | POA: Diagnosis not present

## 2021-06-24 LAB — CBC
HCT: 33.6 % — ABNORMAL LOW (ref 39.0–52.0)
Hemoglobin: 10.9 g/dL — ABNORMAL LOW (ref 13.0–17.0)
MCH: 30.1 pg (ref 26.0–34.0)
MCHC: 32.4 g/dL (ref 30.0–36.0)
MCV: 92.8 fL (ref 80.0–100.0)
Platelets: 216 10*3/uL (ref 150–400)
RBC: 3.62 MIL/uL — ABNORMAL LOW (ref 4.22–5.81)
RDW: 12.2 % (ref 11.5–15.5)
WBC: 8.5 10*3/uL (ref 4.0–10.5)
nRBC: 0 % (ref 0.0–0.2)

## 2021-06-24 LAB — PROCALCITONIN: Procalcitonin: <0.1 ng/mL

## 2021-06-24 LAB — GLUCOSE, CAPILLARY
Glucose-Capillary: 122 mg/dL — ABNORMAL HIGH (ref 70–99)
Glucose-Capillary: 85 mg/dL (ref 70–99)
Glucose-Capillary: 87 mg/dL (ref 70–99)
Glucose-Capillary: 93 mg/dL (ref 70–99)

## 2021-06-24 LAB — BASIC METABOLIC PANEL
Anion gap: 9 (ref 5–15)
BUN: 20 mg/dL (ref 8–23)
CO2: 24 mmol/L (ref 22–32)
Calcium: 8.6 mg/dL — ABNORMAL LOW (ref 8.9–10.3)
Chloride: 105 mmol/L (ref 98–111)
Creatinine, Ser: 1.09 mg/dL (ref 0.61–1.24)
GFR, Estimated: >60 mL/min (ref >60)
Glucose, Bld: 87 mg/dL (ref 70–99)
Potassium: 4.3 mmol/L (ref 3.5–5.1)
Sodium: 138 mmol/L (ref 135–145)

## 2021-06-24 LAB — HEMOGLOBIN A1C
Hgb A1c MFr Bld: 6.6 % — ABNORMAL HIGH (ref 4.8–5.6)
Mean Plasma Glucose: 143 mg/dL

## 2021-06-24 NOTE — Evaluation (Signed)
Physical Therapy Evaluation Patient Details Name: Chad MAGNUSSEN Sr. MRN: [id number] DOB: Mar 07, 1934 Today's Date: 06/24/2021  History of Present Illness  88 y.o. male with medical history significant of CAD, DMT2 diet controlled, vascular dementia, previous stroke, chronic hypotension who presents to the emergency room with increasing weakness over the last few days.  Ported to the emergency room physician that patient has had difficulty ambulating at home and has fallen several times.  Clinical Impression  Pt's vitals, including BP, appropriate for participation with PT.  He needed some assistance with bed mobility to sitting EOB but was able to rise to standing and ambulate nearly 100 ft with FWW.  He uses cane in his dominant R UE but was able to grasp and push up with L UE on walker and did agree that the increased stability was appropriate for him at this time.  He does not recall recent falls (per note wife reports he had falls leaning to this admission) but states he has fallen in the past and does have unsteadiness.       Recommendations for follow up therapy are one component of a multi-disciplinary discharge planning process, led by the attending physician.  Recommendations may be updated based on patient status, additional functional criteria and insurance authorization.  Follow Up Recommendations Home health PT    Assistance Recommended at Discharge Intermittent Supervision/Assistance  Patient can return home with the following  A little help with bathing/dressing/bathroom;A little help with walking and/or transfers;Assistance with cooking/housework;Assist for transportation    Equipment Recommendations None recommended by PT  Recommendations for Other Services       Functional Status Assessment Patient has had a recent decline in their functional status and demonstrates the ability to make significant improvements in function in a reasonable and predictable amount of time.      Precautions / Restrictions Precautions Precautions: Fall Restrictions Weight Bearing Restrictions: No      Mobility  Bed Mobility Overal bed mobility: Needs Assistance Bed Mobility: Supine to Sit     Supine to sit: Mod assist, Min assist     General bed mobility comments: Pt was able to roll toward side of bed but needed considerable assist to actually transition to sitting EOB    Transfers Overall transfer level: Needs assistance Equipment used: Rolling walker (2 wheels) Transfers: Sit to/from Stand Sit to Stand: Min guard           General transfer comment: Pt needed cuing for set up, AD use, sequencing but was able to rise from standard height bed w/o direct physical assist    Ambulation/Gait Ambulation/Gait assistance: Min guard Gait Distance (Feet): 90 Feet Assistive device: Rolling walker (2 wheels)         General Gait Details: Pt with slow but relatively steady gait, occasionally needed minimal cuing to stay inside walker and to maintain cadence.  Reports that at baseline he uses cane; but he was able to use L UE well enough to effectively use the walker and agrees with PT that he will benefit from the increased stability of the walker.  Stairs            Wheelchair Mobility    Modified Rankin (Stroke Patients Only)       Balance Overall balance assessment: Modified Independent  Pertinent Vitals/Pain Pain Assessment Pain Assessment: No/denies pain    Home Living Family/patient expects to be discharged to:: Private residence Living Arrangements: Spouse/significant other Available Help at Discharge: Family Type of Home: House           Home Equipment: Agricultural consultant (2 wheels);Cane - single point      Prior Function Prior Level of Function : History of Falls (last six months);Independent/Modified Independent             Mobility Comments: Pt reports that he will ride  with wife to run errands, etc       Hand Dominance   Dominant Hand: Right    Extremity/Trunk Assessment   Upper Extremity Assessment Upper Extremity Assessment:  (baseline L UE weakness, otherwise WFL t/o)    Lower Extremity Assessment Lower Extremity Assessment: Generalized weakness       Communication   Communication: No difficulties  Cognition Arousal/Alertness: Awake/alert Behavior During Therapy: WFL for tasks assessed/performed Overall Cognitive Status: Within Functional Limits for tasks assessed                                          General Comments      Exercises     Assessment/Plan    PT Assessment Patient needs continued PT services  PT Problem List Decreased strength;Decreased range of motion;Decreased activity tolerance;Decreased balance;Decreased mobility;Decreased knowledge of use of DME;Decreased safety awareness       PT Treatment Interventions DME instruction;Gait training;Stair training;Functional mobility training;Therapeutic activities;Therapeutic exercise;Balance training;Patient/family education    PT Goals (Current goals can be found in the Care Plan section)  Acute Rehab PT Goals Patient Stated Goal: Go home PT Goal Formulation: With patient Time For Goal Achievement: 07/08/21 Potential to Achieve Goals: Good    Frequency Min 2X/week     Co-evaluation               AM-PAC PT "6 Clicks" Mobility  Outcome Measure Help needed turning from your back to your side while in a flat bed without using bedrails?: None Help needed moving from lying on your back to sitting on the side of a flat bed without using bedrails?: A Lot Help needed moving to and from a bed to a chair (including a wheelchair)?: A Little Help needed standing up from a chair using your arms (e.g., wheelchair or bedside chair)?: A Little Help needed to walk in hospital room?: A Little Help needed climbing 3-5 steps with a railing? : A Lot 6 Click  Score: 17    End of Session Equipment Utilized During Treatment: Gait belt Activity Tolerance: Patient limited by fatigue (Pt's O2 and HR with expected changes with activity but nothing excessive, subjective fatigue with the effort) Patient left: with chair alarm set;with call bell/phone within reach Nurse Communication: Mobility status PT Visit Diagnosis: Muscle weakness (generalized) (M62.81);Difficulty in walking, not elsewhere classified (R26.2)    Time: 6063-0160 PT Time Calculation (min) (ACUTE ONLY): 30 min   Charges:   PT Evaluation $PT Eval Low Complexity: 1 Low PT Treatments $Gait Training: 8-22 mins        Malachi Pro, DPT  06/24/2021, 12:30 PM

## 2021-06-24 NOTE — Evaluation (Signed)
Objective Swallowing Evaluation: Type of Study: MBS-Modified Barium Swallow Study   Patient Details  Name: Chad Temme Przybylski Sr. MRN: [id number] Date of Birth: 25-Jul-1933  Today's Date: 06/24/2021 Time: SLP Start Time (ACUTE ONLY): 1400 -SLP Stop Time (ACUTE ONLY): 1500  SLP Time Calculation (min) (ACUTE ONLY): 60 min   Past Medical History:  Past Medical History:  Diagnosis Date   Coronary artery disease    Depression    Diabetes mellitus without complication (HCC)    Seizures (HCC)    Stroke (HCC)    Vascular dementia (HCC)    Past Surgical History:  Past Surgical History:  Procedure Laterality Date   CARDIAC SURGERY     HPI: Chad Nobrega Korenek Sr. is a 88 y.o. male with medical history significant of CAD, DMT2 diet controlled, vascular Dementia, previous stroke, chronic hypotension who presents to the emergency room with increasing weakness over the last few days. Wife reports he has been he has been slightly more confused than his baseline.  Patient has had difficulty ambulating at home and has fallen several times.  He has had decreased appetite and decreased p.o. intake per report.    Chest CT: "Positive for posteromedial right lower lobe bronchopneumonia. 2. Interval development of peripheral architectural distortion,  subpleural reticulation and diffuse mild bronchial wall thickening  compared to remote prior imaging from 2008. Findings suggest  developing chronic bronchitis and pulmonary fibrosis.  3. Extensive aortic and coronary artery vascular calcifications.".   Subjective: Pt alert, sitting upright in chair, needed positioning support -- tended to lean side to side    Recommendations for follow up therapy are one component of a multi-disciplinary discharge planning process, led by the attending physician.  Recommendations may be updated based on patient status, additional functional criteria and insurance authorization.  Assessment / Plan / Recommendation  Clinical  Impressions 06/24/2021  Clinical Impression Pt presents w/ primary pharyngeal phase dysphagia c/b decreased BOT strength and contact along pharyngeal w/ decreased decreased pharyngeal stripping and pressure. This resulted in BOT residue(min+). This residue appeared to reduce and/or clear b/t trials when pt utilized a f/u, Dry swallow. Pt's oral phase of swallowing was grossly adequate though min, inconsistent posterior lingual residue was noted which contributed to the BOT residue. There was not obvious buildup of any oropharyngeal residue, however, min laryngeal aspiration was noted x1 b/t po trials during fleuroscopy. It was Silent in nature.        During the oral phase, pt exhibited fairly appropriate bolus management w/ no obvious bolus loss(anterior/posterior). He was able to demonstrate bolus control w/ "hold, then swallow" instruction x2. Min increased mastication effort noted to organize mastication of cracker trial, but overall WFL. Oral clearing was fair-good, again, w/ more posterior residue noted and no build-up as trials continued.   During the pharyngeal phase, timing of pharyngeal swallow appeared at the level of the Valleculae w/ ALL trial consistencies. NO ASPIRATION NOR LARYNGEAL PENETRATION occurred before or during the swallow. Suspect the aspiration (x1) of pharyngeal residue occurred post swallow b/t trials. Pt appeared insensate to it and was given verbal cue to "cough and Dry swallow" in attempt to clear airway and clear any pharyngeal/BOT residue. Follow through w/ verbal instructions was Fair given MOD cues.   During the Esophageal phase, mildly promonient PE segment noted; though no impediment to bolus motility was noted. Bolus trials appeared to clear the viewable Cervical Esophagus.  Pt is at increased risk for negative sequelae from aspiration d/t oropharyngeal phase dysphagia. Suspect  impact from Baseline Cognitive decline d/t Dementia. Risk for aspiration can be reduced  following general aspiration precautions including NO STRAWS, f/u, Dry swallowing b/t bites/sips to clear any oropharyngeal residue from foods/liquids. Reducing distractions during meals and implementing and intermittent Cough (to help protect airway) during and at end of meal/oral intake is also recommended.   Recommend f/u by Palliative Care services to discuss and education on the impact of dysphagia and risk for Pulmonary issues as well as overall oral intake, and to help establish pt's overall GOC. ST services can f/u at D/C for ongoing education w/ swallowing precautions/strategies. MD/NSG/CM updated.   SLP Visit Diagnosis Dysphagia, oropharyngeal phase (R13.12)  Attention and concentration deficit following --  Frontal lobe and executive function deficit following --  Impact on safety and function Mild aspiration risk;Risk for inadequate nutrition/hydration      Treatment Recommendations 06/24/2021  Treatment Recommendations Defer treatment plan to f/u with SLP upon D/C for ongoing education     Prognosis 06/24/2021  Prognosis for Safe Diet Advancement Fair  Barriers to Reach Goals Cognitive deficits;Time post onset;Severity of deficits;Behavior  Barriers/Prognosis Comment baseline Dementia    Diet Recommendations 06/24/2021  SLP Diet Recommendations Regular solids;Thin liquid  Liquid Administration via Cup;No straw  Medication Administration Whole meds with puree  Compensations Minimize environmental distractions;Slow rate;Small sips/bites;Lingual sweep for clearance of pocketing;Multiple dry swallows after each bite/sip  Postural Changes Remain semi-upright after after feeds/meals (Comment);Seated upright at 90 degrees      Other Recommendations 06/24/2021  Recommended Consults (No Data)  Oral Care Recommendations Oral care BID;Oral care before and after PO;Staff/trained caregiver to provide oral care  Other Recommendations (No Data)  Follow Up Recommendations Home health SLP   Assistance recommended at discharge Intermittent Supervision/Assistance  Functional Status Assessment Patient has had a recent decline in their functional status and/or demonstrates limited ability to make significant improvements in function in a reasonable and predictable amount of time    Frequency and Duration  06/24/2021  Speech Therapy Frequency (ACUTE ONLY) (No Data)  Treatment Duration (No Data)      Oral Phase 06/24/2021  Oral Phase Impaired  Oral - Pudding Teaspoon --  Oral - Pudding Cup --  Oral - Honey Teaspoon --  Oral - Honey Cup NT  Oral - Nectar Teaspoon 1  Oral - Nectar Cup 4  Oral - Nectar Straw --  Oral - Thin Teaspoon 2  Oral - Thin Cup 5  Oral - Thin Straw --  Oral - Puree 1  Oral - Mech Soft 1  Oral - Regular --  Oral - Multi-Consistency --  Oral - Pill --  Oral Phase - Comment --    Pharyngeal Phase 06/24/2021  Pharyngeal Phase Impaired  Pharyngeal- Pudding Teaspoon --  Pharyngeal --  Pharyngeal- Pudding Cup --  Pharyngeal --  Pharyngeal- Honey Teaspoon --  Pharyngeal --  Pharyngeal- Honey Cup NT  Pharyngeal --  Pharyngeal- Nectar Teaspoon 1  Pharyngeal --  Pharyngeal- Nectar Cup 4  Pharyngeal --  Pharyngeal- Nectar Straw --  Pharyngeal --  Pharyngeal- Thin Teaspoon 2  Pharyngeal --  Pharyngeal- Thin Cup 5  Pharyngeal --  Pharyngeal- Thin Straw --  Pharyngeal --  Pharyngeal- Puree 1  Pharyngeal --  Pharyngeal- Mechanical Soft 1  Pharyngeal --  Pharyngeal- Regular --  Pharyngeal --  Pharyngeal- Multi-consistency --  Pharyngeal --  Pharyngeal- Pill --  Pharyngeal --  Pharyngeal Comment --     Cervical Esophageal Phase  06/24/2021  Cervical Esophageal Phase  Impaired  Pudding Teaspoon --  Pudding Cup --  Honey Teaspoon --  Honey Cup NT  Nectar Teaspoon 1  Nectar Cup 4  Nectar Straw --  Thin Teaspoon 2  Thin Cup 5  Thin Straw --  Puree 1  Mechanical Soft 1  Regular --  Multi-consistency --  Pill --  Cervical Esophageal  Comment --            Jerilynn Som, MS, CCC-SLP Speech Language Pathologist Rehab Services; [facility] [phone] (ascom) Olof Marcil 06/24/2021, 5:56 PM

## 2021-06-24 NOTE — Subjective & Objective (Signed)
°  Progress Note   Patient: Chad FOX Sr. OAC:[id number] DOB: Aug 16, 1933 DOA: 06/23/2021     1 DOS: the patient was seen and examined on 06/24/2021   Brief hospital course: Urijah Arko Natter Sr. is a 88 y.o. male with medical history significant of CAD, DMT2 diet controlled, vascular dementia, previous stroke, chronic hypotension who presents to the emergency room with increasing weakness over the last few days. Chest x-ray showed right lower lobe pneumonia.  Patient was started on antibiotics with Rocephin and Zithromax.  Assessment and Plan: * CAP (community acquired pneumonia)- (present on admission) Patient does not have any hypoxemia.  I reviewed chest x-ray, personally reviewed chest x-ray image, patient has a right lower lobe infiltrates.  But procalcitonin level negative.  Patient probably has aspiration pneumonia.  Discussed with speech therapist, he does not appear to have any overt aspiration.  However, he had a significant rhonchi and the right lower lobe after eating breakfast, concerning for silent aspiration.  Will obtain modified barium swallow before discharge. We will continue antibiotics with Rocephin and Zithromax for now.  He may not need any antibiotics at time of discharge.  CAD S/P percutaneous coronary angioplasty Condition is stable.  Generalized weakness Patient has been seen by physical therapy, recommended home PT.  Vascular dementia (HCC) Continue Aricept.  Diabetes mellitus type 2, uncomplicated (HCC) Continue sliding scale insulin.   Subjective:  Patient doing well today, he does not have any short of breath, no hypoxia. Denies any abdominal pain or nausea vomiting.  Physical Exam: Vitals:   06/24/21 0240 06/24/21 0740 06/24/21 0843 06/24/21 1130  BP: 107/64 (!) 121/50 (!) 120/58 (!) 110/50  Pulse: (!) 59 (!) 59 62 (!) 59  Resp: 18 16 (!) 22 16  Temp: 98.4 F (36.9 C) 98.4 F (36.9 C) 98 F (36.7 C) 98.4 F (36.9 C)  TempSrc:  Oral  Oral   SpO2: 94% 91% 96% 92%  Weight:      Height:       General exam: Appears calm and comfortable  Respiratory system: Rhonchi in the right lower field.  Respiratory effort normal. Cardiovascular system: S1 & S2 heard, RRR. No JVD, murmurs, rubs, gallops or clicks. No pedal edema. Gastrointestinal system: Abdomen is nondistended, soft and nontender. No organomegaly or masses felt. Normal bowel sounds heard. Central nervous system: Alert and oriented. No focal neurological deficits. Extremities: Symmetric 5 x 5 power. Skin: No rashes, lesions or ulcers Psychiatry: Judgement and insight appear normal. Mood & affect appropriate.    Data Reviewed: Reviewed chest x-ray image, reviewed all labs.  Family Communication:   Disposition: Status is: Inpatient Remains inpatient appropriate because: Severity of disease, IV antibiotics.     Time spent: 28 minutes  Author: Marrion Coy, MD 06/24/2021 2:57 PM  For on call review [email or web address].

## 2021-06-24 NOTE — Progress Notes (Signed)
Initial Nutrition Assessment  DOCUMENTATION CODES:   Not applicable  INTERVENTION:   -Glucerna Shake po TID, each supplement provides 220 kcal and 10 grams of protein  -MVI with minerals daily  NUTRITION DIAGNOSIS:   Increased nutrient needs related to acute illness as evidenced by estimated needs.  GOAL:   Patient will meet greater than or equal to 90% of their needs  MONITOR:   PO intake, Supplement acceptance, Labs, Weight trends, Skin, I & O's  REASON FOR ASSESSMENT:   Malnutrition Screening Tool    ASSESSMENT:   Chad Kentner Tilmon Sr. is a 88 y.o. male with medical history significant of CAD, DMT2 diet controlled, vascular dementia, previous stroke, chronic hypotension who presents to the emergency room with increasing weakness over the last few days.  Ported to the emergency room physician that patient has had difficulty ambulating at home and has fallen several times.  There is no reported loss of consciousness.  Normally is able to ambulate with a cane to support himself but the last few days he has not been able to even do that.  Wife reports he has been he has been slightly more confused than his baseline.  He has had decreased appetite and decreased p.o. intake.  Wife is no longer at the bedside.  History was obtained by the ER physician.  He states he has not had any fevers and he has no complaints at this time.  He does state he occasionally has a cough.  He does not remember falling.  He denies any chest pain or palpitations.  Pt admitted with CAP.   Reviewed I/O's: +1.1 L x 24 hours  Spoke with pt and wife at bedside. Pt reports feeling better today. Per wife, pt is usually in very good health, but gets around with a walker or a wheelchair. Pt has left sided weakness due to distant stroke. Pt attends an adult daycare daily during the week, which he enjoys. Pt wife reports that pt has a very good appetite and usually consumes 3 meals per day. Pt wife tries to provide a  low sodium, carb modified diet due to pt's health issues and cooks mostly from scratch.   Wife shares that pt has had decline in health and weakness over the past 2-3 days PTA. Noted pt completed 80% of lunch. Pt to go down for MBSS today.   Pt wife does not think pt has lost weight. Noted history of distant weight loss.   Discussed importance of good meal and supplement intake to promote healing.   Labs reviewed.   NUTRITION - FOCUSED PHYSICAL EXAM:  Flowsheet Row Most Recent Value  Orbital Region No depletion  Upper Arm Region Mild depletion  Thoracic and Lumbar Region No depletion  Buccal Region No depletion  Temple Region No depletion  Clavicle Bone Region No depletion  Clavicle and Acromion Bone Region No depletion  Scapular Bone Region No depletion  Dorsal Hand No depletion  Patellar Region Mild depletion  Anterior Thigh Region Mild depletion  Posterior Calf Region Mild depletion  Edema (RD Assessment) None  Hair Reviewed  Eyes Reviewed  Mouth Reviewed  Skin Reviewed  Nails Reviewed       Diet Order:   Diet Order             Diet heart healthy/carb modified Room service appropriate? Yes; Fluid consistency: Thin  Diet effective now  EDUCATION NEEDS:   Education needs have been addressed  Skin:  Skin Assessment: Reviewed RN Assessment  Last BM:  Unknown  Height:   Ht Readings from Last 1 Encounters:  06/23/21 5\' 5"  (1.651 m)    Weight:   Wt Readings from Last 1 Encounters:  06/23/21 73.4 kg    Ideal Body Weight:  61.8 kg  BMI:  Body mass index is 26.93 kg/m.  Estimated Nutritional Needs:   Kcal:  1550-1750  Protein:  75-90 grams  Fluid:  > 1.5 L    06/25/21, RD, LDN, CDCES Registered Dietitian II Certified Diabetes Care and Education Specialist Please refer to [facility] for RD and/or RD on-call/weekend/after hours pager

## 2021-06-24 NOTE — TOC Initial Note (Signed)
Transition of Care (TOC) - Initial/Assessment Note    Patient Details  Name: Chad Lippman Anastacio Sr. MRN: [id number] Date of Birth: 1933/12/15  Transition of Care [facility] CM/SW Contact:    Marlowe Sax, RN Phone Number: 06/24/2021, 9:48 AM  Clinical Narrative:              patient from home with his wife, more confused than baseline, walks with a cane at baseline, TOC to follow for assisting with DC planning and needs, Anticipate PT consult and eval     Expected Discharge Plan: Home w Home Health Services Barriers to Discharge: Continued Medical Work up   Patient Goals and CMS Choice        Expected Discharge Plan and Services Expected Discharge Plan: Home w Home Health Services   Discharge Planning Services: CM Consult   Living arrangements for the past 2 months: Single Family Home                                      Prior Living Arrangements/Services Living arrangements for the past 2 months: Single Family Home Lives with:: Spouse              Current home services: DME (cane)    Activities of Daily Living Home Assistive Devices/Equipment: Wheelchair, Eyeglasses ADL Screening (condition at time of admission) Patient's cognitive ability adequate to safely complete daily activities?: Yes Is the patient deaf or have difficulty hearing?: No Does the patient have difficulty seeing, even when wearing glasses/contacts?: No Does the patient have difficulty concentrating, remembering, or making decisions?: Yes Patient able to express need for assistance with ADLs?: Yes Does the patient have difficulty dressing or bathing?: No Independently performs ADLs?: No Communication: Independent Dressing (OT): Independent Grooming: Independent Feeding: Independent Bathing: Independent Toileting: Needs assistance Is this a change from baseline?: Change from baseline, expected to last <3 days In/Out Bed: Needs assistance Is this a change from baseline?: Pre-admission  baseline Walks in Home: Needs assistance Is this a change from baseline?: Pre-admission baseline Does the patient have difficulty walking or climbing stairs?: Yes Weakness of Legs: Both Weakness of Arms/Hands: None  Permission Sought/Granted                  Emotional Assessment              Admission diagnosis:  Bronchopneumonia [J18.0] CAP (community acquired pneumonia) [J18.9] Generalized weakness [R53.1] Patient Active Problem List   Diagnosis Date Noted   CAP (community acquired pneumonia) 06/23/2021   Diabetes mellitus type 2, uncomplicated (HCC) 06/23/2021   Vascular dementia (HCC) 06/23/2021   Generalized weakness 06/23/2021   CAD S/P percutaneous coronary angioplasty 06/23/2021   PCP:  Tessie Fass, MD Pharmacy:   [facility] - [place], [place] - [address] [place] [address] Phone: [phone] Fax: [phone]     Social Determinants of Health (SDOH) Interventions    Readmission Risk Interventions No flowsheet data found.

## 2021-06-24 NOTE — Hospital Course (Signed)
Chad Sox Kooy Sr. is a 88 y.o. male with medical history significant of CAD, DMT2 diet controlled, vascular dementia, previous stroke, chronic hypotension who presents to the emergency room with increasing weakness over the last few days. Chest x-ray showed right lower lobe pneumonia.  Patient was started on antibiotics with Rocephin and Zithromax.

## 2021-06-24 NOTE — Evaluation (Addendum)
Clinical/Bedside Swallow Evaluation Patient Details  Name: Chad Groman Tonche Sr. MRN: [id number] Date of Birth: [birth year]  Today's Date: 06/24/2021 Time: SLP Start Time (ACUTE ONLY): 0915 SLP Stop Time (ACUTE ONLY): 0930 SLP Time Calculation (min) (ACUTE ONLY): 15 min  Past Medical History:  Past Medical History:  Diagnosis Date   Coronary artery disease    Depression    Diabetes mellitus without complication (HCC)    Seizures (HCC)    Stroke (HCC)    Vascular dementia (HCC)    Past Surgical History:  Past Surgical History:  Procedure Laterality Date   CARDIAC SURGERY     HPI:  Chad Matthis Wearing Sr. is a 88 y.o. male with medical history significant of CAD, DMT2 diet controlled, vascular dementia, previous stroke, chronic hypotension who presents to the emergency room with increasing weakness over the last few days. Wife reports he has been he has been slightly more confused than his baseline.  He has had decreased appetite and decreased p.o. intake. Pt is currently on a regular solids and thin liquids diet. SLP completing bedside swallow assessment this date. Pt sitting upright in recliner, with congested cough noted intermittently in the absence of PO intake.    Assessment / Plan / Recommendation  Clinical Impression  Pt presents with suspected oropharyngeal dysphagia- with concern for silent aspiration. Trials completed included regular/puree solids and thin liquids via cup/straw. No s/sx of aspiration across trials despite challenging with serial straw sips. MD reported concern for silent aspiration. Plan to complete MBSS for further assessment for pharyngeal stage of swallow. Oral phase grossly WFL; pt demonstrated functional acceptance, mastication, and clearance of all solids from the oral cavity. Pt maintained upright positioning and slow rate across trials. Recommend continued regular solids and thin liquids with aspiration precautions (slow rate, small bites, elevates HOB, and alert  for PO intake). Education shared to nursing regarding results of assessment and recommendations for general aspiration precautions.  SLP Visit Diagnosis: Dysphagia, oropharyngeal phase (R13.12)    Aspiration Risk  Mild aspiration risk    Diet Recommendation   Regular solids and thin liquids   Medication Administration: Whole meds with puree    Other  Recommendations Oral Care Recommendations: Oral care BID    Recommendations for follow up therapy are one component of a multi-disciplinary discharge planning process, led by the attending physician.  Recommendations may be updated based on patient status, additional functional criteria and insurance authorization.  Follow up Recommendations Frequency: 2x/week Treatment Duration: 2 weeks Follow Up recommendations: TBD      Swallow Study   General Date of Onset: 06/24/21 HPI: Chad Nave Chappelle Sr. is a 88 y.o. male with medical history significant of CAD, DMT2 diet controlled, vascular dementia, previous stroke, chronic hypotension who presents to the emergency room with increasing weakness over the last few days. Wife reports he has been he has been slightly more confused than his baseline.  He has had decreased appetite and decreased p.o. intake. Pt is currently on a regular solids and thin liquids diet. SLP completing bedside swallow assessment this date. Type of Study: Bedside Swallow Evaluation Previous Swallow Assessment: none in chart Diet Prior to this Study: Regular;Thin liquids Temperature Spikes Noted: No (8.5  WBC- trending down) Respiratory Status: Room air History of Recent Intubation: No Behavior/Cognition: Alert;Confused;Requires cueing Oral Cavity Assessment: Within Functional Limits Oral Care Completed by SLP: Other (Comment) (pt reported recent completion) Oral Cavity - Dentition: Adequate natural dentition Vision: Functional for self-feeding Self-Feeding Abilities: Able to feed  self Patient Positioning: Upright in  chair Baseline Vocal Quality: Low vocal intensity Volitional Cough: Congested Volitional Swallow: Unable to elicit    Oral/Motor/Sensory Function Overall Oral Motor/Sensory Function: Within functional limits   Ice Chips Ice chips: Not tested   Thin Liquid Thin Liquid: Within functional limits Presentation: Cup;Straw    Nectar Thick Nectar Thick Liquid: Not tested   Honey Thick Honey Thick Liquid: Not tested   Puree Puree: Within functional limits Presentation: Self Fed;Spoon   Solid   Chad Pharaoh Pio Clapp  MS CCC-SLP Solid: Within functional limits Presentation: Self Fed      Chad [name] 06/24/2021,10:43 AM

## 2021-06-25 DIAGNOSIS — J690 Pneumonitis due to inhalation of food and vomit: Secondary | ICD-10-CM | POA: Diagnosis not present

## 2021-06-25 DIAGNOSIS — E119 Type 2 diabetes mellitus without complications: Secondary | ICD-10-CM | POA: Diagnosis not present

## 2021-06-25 DIAGNOSIS — F01C0 Vascular dementia, severe, without behavioral disturbance, psychotic disturbance, mood disturbance, and anxiety: Secondary | ICD-10-CM

## 2021-06-25 LAB — GLUCOSE, CAPILLARY
Glucose-Capillary: 91 mg/dL (ref 70–99)
Glucose-Capillary: 99 mg/dL (ref 70–99)

## 2021-06-25 MED ORDER — AMOXICILLIN-POT CLAVULANATE 875-125 MG PO TABS: 1.0000 | ORAL_TABLET | Freq: Two times a day (BID) | ORAL | 0 refills | Status: AC

## 2021-06-25 NOTE — Progress Notes (Signed)
IV removed, cath tip intact. Patients wife, Elease Hashimoto, given discharge instructions. Dressed patient in home clothing, all belongings returned to wife. No further questions at this time.

## 2021-06-25 NOTE — Assessment & Plan Note (Signed)
Patient chest x-ray showed a right lower lobe pneumonia, procalcitonin level less than 0.1.  Patient has been seen by speech therapist, has evidence of aspiration during modified barium swallow test. Discussed with speech therapist, this cannot be corrected easily.  Patient be followed by speech therapy after discharge.  I will continue with 3 more days of Augmentin. Due to risk of aspiration, patient long-term prognosis is poor.  Patient be referred to palliative care.

## 2021-06-25 NOTE — Discharge Summary (Signed)
Physician Discharge Summary   Patient: Chad Pham Diveley Sr. MRN: [id number] DOB: Dec 25, 1933  Admit date:     06/23/2021  Discharge date: 06/25/21  Discharge Physician: Marrion Coy   PCP: Tessie Fass, MD   Recommendations at discharge:   Follow-up with PCP in 1 week Followed by speech therapy and home care. Refer to outpatient palliative care for poor prognosis.  Discharge Diagnoses: Active Problems:   Diabetes mellitus type 2, uncomplicated (HCC)   Vascular dementia (HCC)   Generalized weakness   CAD S/P percutaneous coronary angioplasty   Aspiration pneumonia (HCC)  Resolved Problems:   * No resolved hospital problems. *   Hospital Course: Chad RAMMEL Sr. is a 88 y.o. male with medical history significant of CAD, DMT2 diet controlled, vascular dementia, previous stroke, chronic hypotension who presents to the emergency room with increasing weakness over the last few days. Chest x-ray showed right lower lobe pneumonia.  Patient was started on antibiotics with Rocephin and Zithromax.  Assessment and Plan: Aspiration pneumonia [facility]) Patient chest x-ray showed a right lower lobe pneumonia, procalcitonin level less than 0.1.  Patient has been seen by speech therapist, has evidence of aspiration during modified barium swallow test. Discussed with speech therapist, this cannot be corrected easily.  Patient be followed by speech therapy after discharge.  I will continue with 3 more days of Augmentin. Due to risk of aspiration, patient long-term prognosis is poor.  Patient be referred to palliative care.  CAD S/P percutaneous coronary angioplasty Condition is stable.  Generalized weakness Patient has been seen by physical therapy, recommended home PT. will set up home care with PT.  Vascular dementia (HCC) Continue Aricept.  Diabetes mellitus type 2, uncomplicated (HCC) Follow-up with PCP as outpatient.  Not tight control as needed due to advanced age and poor  long-term prognosis  CAP (community acquired pneumonia) Patient does not have any hypoxemia.  I reviewed chest x-ray, personally reviewed chest x-ray image, patient has a right lower lobe infiltrates.  But procalcitonin level negative.  Patient probably has aspiration pneumonia.  Discussed with speech therapist, he does not appear to have any overt aspiration.  However, he had a significant rhonchi and the right lower lobe after eating breakfast, concerning for silent aspiration.  Will obtain modified barium swallow before discharge. We will continue antibiotics with Rocephin and Zithromax for now.  He may not need any antibiotics at time of discharge.          Consultants: None Procedures performed: None  Disposition: Home health Diet recommendation:  Discharge Diet Orders (From admission, onward)     Start     Ordered   06/25/21 0000  Diet - low sodium heart healthy        06/25/21 1016           Cardiac diet  DISCHARGE MEDICATION: Allergies as of 06/25/2021   No Known Allergies      Medication List     TAKE these medications    amoxicillin-clavulanate 875-125 MG tablet Commonly known as: Augmentin Take 1 tablet by mouth 2 (two) times daily for 3 days.   aspirin 81 MG chewable tablet Chew 81 mg by mouth daily.   atorvastatin 40 MG tablet Commonly known as: LIPITOR Take 40 mg by mouth daily.   donepezil 10 MG tablet Commonly known as: ARICEPT Take 10 mg by mouth at bedtime.   finasteride 5 MG tablet Commonly known as: PROSCAR Take 5 mg by mouth daily.   isosorbide mononitrate  30 MG 24 hr tablet Commonly known as: IMDUR Take 30 mg by mouth daily.   lamoTRIgine 200 MG tablet Commonly known as: LAMICTAL Take 200 mg by mouth daily.   midodrine 2.5 MG tablet Commonly known as: PROAMATINE Take 2.5 mg by mouth 2 (two) times daily. "Take 1 tablet 2x daily" per cardiology notes. What changed: Another medication with the same name was removed. Continue  taking this medication, and follow the directions you see here.   QUEtiapine 25 MG tablet Commonly known as: SEROQUEL Take 25 mg by mouth 2 (two) times daily.   sertraline 100 MG tablet Commonly known as: ZOLOFT Take 100 mg by mouth daily. What changed: Another medication with the same name was removed. Continue taking this medication, and follow the directions you see here.        Follow-up Information     Cooner, Ruben GottronEdward William, MD Follow up in 1 week(s).   Specialty: Internal Medicine Contact information: [address] [place] [id number] [phone]                 Discharge Exam: Filed Weights   06/23/21 1957  Weight: 73.4 kg   General exam: Appears calm and comfortable  Respiratory system: Clear to auscultation. Respiratory effort normal. Cardiovascular system: S1 & S2 heard, RRR. No JVD, murmurs, rubs, gallops or clicks. No pedal edema. Gastrointestinal system: Abdomen is nondistended, soft and nontender. No organomegaly or masses felt. Normal bowel sounds heard. Central nervous system: Alert and oriented x2. No focal neurological deficits. Extremities: Symmetric 5 x 5 power. Skin: No rashes, lesions or ulcers Psychiatry: Judgement and insight appear normal. Mood & affect appropriate.    Condition at discharge: good  The results of significant diagnostics from this hospitalization (including imaging, microbiology, ancillary and laboratory) are listed below for reference.   Imaging Studies: CT HEAD WO CONTRAST (5MM)  Result Date: 06/23/2021 CLINICAL DATA:  An 88 year old presents with increased weakness since last weekend. EXAM: CT HEAD WITHOUT CONTRAST CT CERVICAL SPINE WITHOUT CONTRAST TECHNIQUE: Multidetector CT imaging of the head and cervical spine was performed following the standard protocol without intravenous contrast. Multiplanar CT image reconstructions of the cervical spine were also generated. RADIATION DOSE REDUCTION: This exam  was performed according to the departmental dose-optimization program which includes automated exposure control, adjustment of the mA and/or kV according to patient size and/or use of iterative reconstruction technique. COMPARISON:  Head CT of June of 2021. FINDINGS: CT HEAD FINDINGS Brain: No evidence of acute infarction, hemorrhage, hydrocephalus, extra-axial collection or mass lesion/mass effect. Signs of prior RIGHT MCA infarct with encephalomalacia not substantially changed since 2021. Evidence of atrophy and chronic microvascular ischemic change also similar to previous imaging. Vascular: No hyperdense vessel or unexpected calcification. Skull: Normal. Negative for fracture or focal lesion. Sinuses/Orbits: Mild mucosal thickening of the ethmoid sinuses. Moderate RIGHT maxillary sinus disease with chronic appearance. No air-fluid levels in the sinuses. Mild sphenoid sinus disease on the RIGHT as well. No acute orbital findings. Other: None CT CERVICAL SPINE FINDINGS Alignment: Trace anterolisthesis of C3 on C4 proximally 1-2 mm in the setting of marked facet arthropathy. Straightening of normal cervical lordotic curvature also in the setting of marked degenerative changes. Skull base and vertebrae: No acute fracture. No primary bone lesion or focal pathologic process. Soft tissues and spinal canal: No prevertebral fluid or swelling. No visible canal hematoma. Disc levels: Multilevel degenerative changes with disc space narrowing and hypertrophic facet arthropathy. Facet arthropathy greatest in the  upper cervical spine on the RIGHT. Disc space narrowing greatest in the mid cervical spine, also associated with small anterior osteophytes. Upper chest: Negative. Other: None IMPRESSION: 1. No acute intracranial abnormality. 2. Evidence of prior RIGHT MCA infarct with encephalomalacia, atrophy and chronic microvascular ischemic changes. 3. No acute fracture or traumatic subluxation of the cervical spine. 4.  Multilevel degenerative changes of the cervical spine, as described above. 5. Chronic paranasal sinus disease, as described above. Electronically Signed   By: Donzetta KohutGeoffrey  Wile M.D.   On: 06/23/2021 10:46   CT Chest Wo Contrast  Result Date: 06/23/2021 CLINICAL DATA:  Respiratory illness EXAM: CT CHEST WITHOUT CONTRAST TECHNIQUE: Multidetector CT imaging of the chest was performed following the standard protocol without IV contrast. RADIATION DOSE REDUCTION: This exam was performed according to the departmental dose-optimization program which includes automated exposure control, adjustment of the mA and/or kV according to patient size and/or use of iterative reconstruction technique. COMPARISON:  Prior CT scan of the chest 07/29/2006 FINDINGS: Cardiovascular: Limited evaluation in the absence of intravenous contrast. Surgical changes of prior CABG. Extensive aortic and coronary artery atherosclerotic vascular calcifications. The heart is normal in size. No pericardial effusion. Mediastinum/Nodes: Unremarkable CT appearance of the thyroid gland. No suspicious mediastinal or hilar adenopathy. No soft tissue mediastinal mass. The thoracic esophagus is unremarkable. Lungs/Pleura: Diffuse mild bronchial wall thickening, particularly in the lower lobes is new compared to remote prior imaging from 2008. Also new is peripheral architectural distortion and subpleural reticulation bilaterally likely reflecting changes of pulmonary fibrosis. There is focal peribronchovascular airspace opacity in the posteromedial right lower lobe concerning for bronchopneumonia. No pleural effusion or pneumothorax. No suspicious pulmonary mass or nodule. Upper Abdomen: Cholelithiasis. Nonspecific perinephric stranding bilaterally. No acute abnormality within the upper abdomen. Musculoskeletal: No acute fracture or aggressive appearing lytic or blastic osseous lesion. IMPRESSION: 1. Positive for posteromedial right lower lobe bronchopneumonia.  2. Interval development of peripheral architectural distortion, subpleural reticulation and diffuse mild bronchial wall thickening compared to remote prior imaging from 2008. Findings suggest developing chronic bronchitis and pulmonary fibrosis. 3. Extensive aortic and coronary artery vascular calcifications. 4. Cholelithiasis. Aortic Atherosclerosis (ICD10-I70.0). Electronically Signed   By: Malachy MoanHeath  McCullough M.D.   On: 06/23/2021 14:27   CT Cervical Spine Wo Contrast  Result Date: 06/23/2021 CLINICAL DATA:  An 88 year old presents with increased weakness since last weekend. EXAM: CT HEAD WITHOUT CONTRAST CT CERVICAL SPINE WITHOUT CONTRAST TECHNIQUE: Multidetector CT imaging of the head and cervical spine was performed following the standard protocol without intravenous contrast. Multiplanar CT image reconstructions of the cervical spine were also generated. RADIATION DOSE REDUCTION: This exam was performed according to the departmental dose-optimization program which includes automated exposure control, adjustment of the mA and/or kV according to patient size and/or use of iterative reconstruction technique. COMPARISON:  Head CT of June of 2021. FINDINGS: CT HEAD FINDINGS Brain: No evidence of acute infarction, hemorrhage, hydrocephalus, extra-axial collection or mass lesion/mass effect. Signs of prior RIGHT MCA infarct with encephalomalacia not substantially changed since 2021. Evidence of atrophy and chronic microvascular ischemic change also similar to previous imaging. Vascular: No hyperdense vessel or unexpected calcification. Skull: Normal. Negative for fracture or focal lesion. Sinuses/Orbits: Mild mucosal thickening of the ethmoid sinuses. Moderate RIGHT maxillary sinus disease with chronic appearance. No air-fluid levels in the sinuses. Mild sphenoid sinus disease on the RIGHT as well. No acute orbital findings. Other: None CT CERVICAL SPINE FINDINGS Alignment: Trace anterolisthesis of C3 on C4  proximally 1-2 mm in the  setting of marked facet arthropathy. Straightening of normal cervical lordotic curvature also in the setting of marked degenerative changes. Skull base and vertebrae: No acute fracture. No primary bone lesion or focal pathologic process. Soft tissues and spinal canal: No prevertebral fluid or swelling. No visible canal hematoma. Disc levels: Multilevel degenerative changes with disc space narrowing and hypertrophic facet arthropathy. Facet arthropathy greatest in the upper cervical spine on the RIGHT. Disc space narrowing greatest in the mid cervical spine, also associated with small anterior osteophytes. Upper chest: Negative. Other: None IMPRESSION: 1. No acute intracranial abnormality. 2. Evidence of prior RIGHT MCA infarct with encephalomalacia, atrophy and chronic microvascular ischemic changes. 3. No acute fracture or traumatic subluxation of the cervical spine. 4. Multilevel degenerative changes of the cervical spine, as described above. 5. Chronic paranasal sinus disease, as described above. Electronically Signed   By: Donzetta Kohut M.D.   On: 06/23/2021 10:46   DG Chest Portable 1 View  Result Date: 06/23/2021 CLINICAL DATA:  Cough, weakness. Additional history provided: Cough, weakness with multiple falls recently per family. History of CAD and diabetes mellitus. EXAM: PORTABLE CHEST 1 VIEW COMPARISON:  Prior chest radiographs 10/04/2017 and earlier. Report from radiographs of the left shoulder 06/10/2020. FINDINGS: Prior median sternotomy/CABG. Heart size at the upper limits of normal, unchanged. Aortic atherosclerosis. No appreciable airspace consolidation or pulmonary edema. No evidence of pleural effusion or pneumothorax. Incompletely imaged and incompletely assessed known fracture of the proximal left humerus. IMPRESSION: No evidence of acute cardiopulmonary abnormality. Aortic Atherosclerosis (ICD10-I70.0). Incompletely imaged and incompletely assessed known fracture of  the proximal left humerus. Electronically Signed   By: Jackey Loge D.O.   On: 06/23/2021 11:00    Microbiology: Results for orders placed or performed during the hospital encounter of 06/23/21  Blood culture (single)     Status: None (Preliminary result)   Collection Time: 06/23/21  9:59 AM   Specimen: BLOOD  Result Value Ref Range Status   Specimen Description BLOOD RIGHT ANTECUBITAL  Final   Special Requests   Final    BOTTLES DRAWN AEROBIC AND ANAEROBIC Blood Culture results may not be optimal due to an excessive volume of blood received in culture bottles   Culture   Final    NO GROWTH 2 DAYS Performed at [facility], [address], [place], [place] [address]    Report Status PENDING  Incomplete  Resp Panel by RT-PCR (Flu A&B, Covid) Nasopharyngeal Swab     Status: None   Collection Time: 06/23/21 10:03 AM   Specimen: Nasopharyngeal Swab; Nasopharyngeal(NP) swabs in vial transport medium  Result Value Ref Range Status   SARS Coronavirus 2 by RT PCR NEGATIVE NEGATIVE Final    Comment: (NOTE) SARS-CoV-2 target nucleic acids are NOT DETECTED.  The SARS-CoV-2 RNA is generally detectable in upper respiratory specimens during the acute phase of infection. The lowest concentration of SARS-CoV-2 viral copies this assay can detect is 138 copies/mL. A negative result does not preclude SARS-Cov-2 infection and should not be used as the sole basis for treatment or other patient management decisions. A negative result may occur with  improper specimen collection/handling, submission of specimen other than nasopharyngeal swab, presence of viral mutation(s) within the areas targeted by this assay, and inadequate number of viral copies(<138 copies/mL). A negative result must be combined with clinical observations, patient history, and epidemiological information. The expected result is Negative.  Fact Sheet for Patients:  BloggerCourse.com  Fact Sheet  for Healthcare Providers:  SeriousBroker.it  This  test is no t yet approved or cleared by the [place] and  has been authorized for detection and/or diagnosis of SARS-CoV-2 by FDA under an Emergency Use Authorization (EUA). This EUA will remain  in effect (meaning this test can be used) for the duration of the COVID-19 declaration under Section 564(b)(1) of the Act, 21 U.S.C.section 360bbb-3(b)(1), unless the authorization is terminated  or revoked sooner.       Influenza A by PCR NEGATIVE NEGATIVE Final   Influenza B by PCR NEGATIVE NEGATIVE Final    Comment: (NOTE) The Xpert Xpress SARS-CoV-2/FLU/RSV plus assay is intended as an aid in the diagnosis of influenza from Nasopharyngeal swab specimens and should not be used as a sole basis for treatment. Nasal washings and aspirates are unacceptable for Xpert Xpress SARS-CoV-2/FLU/RSV testing.  Fact Sheet for Patients: BloggerCourse.com  Fact Sheet for Healthcare Providers: SeriousBroker.it  This test is not yet approved or cleared by the [place] FDA and has been authorized for detection and/or diagnosis of SARS-CoV-2 by FDA under an Emergency Use Authorization (EUA). This EUA will remain in effect (meaning this test can be used) for the duration of the COVID-19 declaration under Section 564(b)(1) of the Act, 21 U.S.C. section 360bbb-3(b)(1), unless the authorization is terminated or revoked.  Performed at [facility], [address] [place], [place] [address]     Labs: CBC: Recent Labs  Lab 06/23/21 [phone] 06/24/21 0447  WBC 10.0 8.5  HGB 11.8* 10.9*  HCT 36.4* 33.6*  MCV 95.8 92.8  PLT 215 216   Basic Metabolic Panel: Recent Labs  Lab 06/23/21 0928 06/24/21 0447  NA 136 138  K 4.7 4.3  CL 100 105  CO2 27 24  GLUCOSE 125* 87  BUN 21 20  CREATININE 1.09 1.09  CALCIUM 9.0 8.6*   Liver Function  Tests: Recent Labs  Lab 06/23/21 0959  AST 27  ALT 19  ALKPHOS 96  BILITOT 1.0  PROT 6.8  ALBUMIN 3.5   CBG: Recent Labs  Lab 06/24/21 0842 06/24/21 1334 06/24/21 1622 06/25/21 0144 06/25/21 0814  GLUCAP 87 93 122* 91 99    Discharge time spent: greater than 30 minutes.  Signed: Marrion Coy, MD [facility] 06/25/2021

## 2021-06-28 LAB — CULTURE, BLOOD (SINGLE): Culture: NO GROWTH

## 2022-03-22 ENCOUNTER — Ambulatory Visit: Payer: Medicare Other

## 2022-04-02 ENCOUNTER — Ambulatory Visit: Payer: Medicare Other

## 2022-08-24 ENCOUNTER — Ambulatory Visit (LOCAL_COMMUNITY_HEALTH_CENTER): Payer: Self-pay

## 2022-08-24 ENCOUNTER — Other Ambulatory Visit: Payer: Medicare Other

## 2022-08-24 DIAGNOSIS — Z111 Encounter for screening for respiratory tuberculosis: Secondary | ICD-10-CM

## 2022-08-27 ENCOUNTER — Ambulatory Visit (LOCAL_COMMUNITY_HEALTH_CENTER): Payer: Self-pay

## 2022-08-27 DIAGNOSIS — Z111 Encounter for screening for respiratory tuberculosis: Secondary | ICD-10-CM

## 2022-08-27 LAB — TB SKIN TEST
Induration: 0 mm
TB Skin Test: NEGATIVE

## 2022-08-31 ENCOUNTER — Emergency Department: Payer: Medicare Other

## 2022-08-31 ENCOUNTER — Emergency Department
Admission: EM | Admit: 2022-08-31 | Discharge: 2022-08-31 | Disposition: A | Discharge status: Skilled Nursing Facility | Payer: Medicare Other | Attending: Emergency Medicine | Admitting: Emergency Medicine

## 2022-08-31 ENCOUNTER — Other Ambulatory Visit: Payer: Self-pay

## 2022-08-31 DIAGNOSIS — D72829 Elevated white blood cell count, unspecified: Secondary | ICD-10-CM | POA: Diagnosis not present

## 2022-08-31 DIAGNOSIS — R1030 Lower abdominal pain, unspecified: Secondary | ICD-10-CM | POA: Diagnosis present

## 2022-08-31 DIAGNOSIS — W19XXXA Unspecified fall, initial encounter: Secondary | ICD-10-CM | POA: Insufficient documentation

## 2022-08-31 DIAGNOSIS — R531 Weakness: Secondary | ICD-10-CM | POA: Diagnosis not present

## 2022-08-31 DIAGNOSIS — K5641 Fecal impaction: Secondary | ICD-10-CM

## 2022-08-31 DIAGNOSIS — F0390 Unspecified dementia without behavioral disturbance: Secondary | ICD-10-CM

## 2022-08-31 LAB — CBC WITH DIFFERENTIAL/PLATELET
Abs Immature Granulocytes: 0.12 10*3/uL — ABNORMAL HIGH (ref 0.00–0.07)
Basophils Absolute: 0 10*3/uL (ref 0.0–0.1)
Basophils Relative: 0 %
Eosinophils Absolute: 0 10*3/uL (ref 0.0–0.5)
Eosinophils Relative: 0 %
HCT: 37 % — ABNORMAL LOW (ref 39.0–52.0)
Hemoglobin: 11.9 g/dL — ABNORMAL LOW (ref 13.0–17.0)
Immature Granulocytes: 1 %
Lymphocytes Relative: 8 %
Lymphs Abs: 1.2 10*3/uL (ref 0.7–4.0)
MCH: 30.4 pg (ref 26.0–34.0)
MCHC: 32.2 g/dL (ref 30.0–36.0)
MCV: 94.4 fL (ref 80.0–100.0)
Monocytes Absolute: 1.3 10*3/uL — ABNORMAL HIGH (ref 0.1–1.0)
Monocytes Relative: 8 %
Neutro Abs: 13.2 10*3/uL — ABNORMAL HIGH (ref 1.7–7.7)
Neutrophils Relative %: 83 %
Platelets: 200 10*3/uL (ref 150–400)
RBC: 3.92 MIL/uL — ABNORMAL LOW (ref 4.22–5.81)
RDW: 13.6 % (ref 11.5–15.5)
WBC: 15.8 10*3/uL — ABNORMAL HIGH (ref 4.0–10.5)
nRBC: 0 % (ref 0.0–0.2)

## 2022-08-31 LAB — COMPREHENSIVE METABOLIC PANEL
ALT: 17 U/L (ref 0–44)
AST: 27 U/L (ref 15–41)
Albumin: 3.5 g/dL (ref 3.5–5.0)
Alkaline Phosphatase: 87 U/L (ref 38–126)
Anion gap: 8 (ref 5–15)
BUN: 30 mg/dL — ABNORMAL HIGH (ref 8–23)
CO2: 27 mmol/L (ref 22–32)
Calcium: 9 mg/dL (ref 8.9–10.3)
Chloride: 103 mmol/L (ref 98–111)
Creatinine, Ser: 1.04 mg/dL (ref 0.61–1.24)
GFR, Estimated: >60 mL/min (ref >60)
Glucose, Bld: 142 mg/dL — ABNORMAL HIGH (ref 70–99)
Potassium: 4.3 mmol/L (ref 3.5–5.1)
Sodium: 138 mmol/L (ref 135–145)
Total Bilirubin: 0.8 mg/dL (ref 0.3–1.2)
Total Protein: 6.4 g/dL — ABNORMAL LOW (ref 6.5–8.1)

## 2022-08-31 LAB — URINALYSIS, ROUTINE W REFLEX MICROSCOPIC
Bacteria, UA: NONE SEEN
Bilirubin Urine: NEGATIVE
Glucose, UA: NEGATIVE mg/dL
Ketones, ur: NEGATIVE mg/dL
Leukocytes,Ua: NEGATIVE
Nitrite: NEGATIVE
Protein, ur: NEGATIVE mg/dL
Specific Gravity, Urine: 1.024 (ref 1.005–1.030)
pH: 5 (ref 5.0–8.0)

## 2022-08-31 LAB — LACTIC ACID, PLASMA: Lactic Acid, Venous: 1.5 mmol/L (ref 0.5–1.9)

## 2022-08-31 LAB — PROCALCITONIN: Procalcitonin: <0.1 ng/mL

## 2022-08-31 MED ORDER — IOHEXOL 300 MG/ML  SOLN
100.0000 mL | Freq: Once | INTRAMUSCULAR | Status: AC | PRN
  Administered 2022-08-31: 100 mL via INTRAVENOUS

## 2022-08-31 MED ORDER — SODIUM CHLORIDE 0.9 % IV BOLUS
1000.0000 mL | Freq: Once | INTRAVENOUS | Status: AC
  Administered 2022-08-31: 1000 mL via INTRAVENOUS

## 2022-08-31 NOTE — ED Provider Notes (Signed)
-----------------------------------------   7:11 AM on 08/31/2022 -----------------------------------------  Blood pressure 119/64, pulse (!) 142, temperature 98 F (36.7 C), temperature source Oral, resp. rate 18, height 5\' 9"  (1.753 m), weight 55.7 kg, SpO2 91 %.  Assuming care from Dr. Erma Heritage.  In short, Chad OSMON Sr. is a 89 y.o. male with a chief complaint of Fall .  Refer to the original H&P for additional details.  The current plan of care is to follow-up UA and CXR for increased confusion with multiple falls.  ----------------------------------------- 10:48 AM on 08/31/2022 ----------------------------------------- Urinalysis shows no signs of infection, chest x-ray is also unremarkable.  On reassessment, Chad [name] had some tenderness of his abdomen and CT was performed but negative for acute process beyond fecal impaction.  Chad [name] was manually disimpacted, now at his baseline mental status with no complaints.  Infectious workup has been unremarkable, procalcitonin is undetectable and Chad [name] appropriate for discharge back to nursing facility.  Case discussed with Chad [name]'s son over the phone, who agrees with plan.  ------------------------------------------------------------------------------------------------------------------- Fecal Disimpaction Procedure Note:  Performed by me:  Chad [name] placed in the lateral recumbent position with knees drawn towards chest. Nurse present for Chad [name] support. Large amount of hard brown stool removed. No complications during procedure.   ------------------------------------------------------------------------------------------------------------------     Chesley Noon, MD 08/31/22 1048

## 2022-08-31 NOTE — ED Triage Notes (Addendum)
Pt arrives via [facility] from [facility] with CC of two falls today. No LOC but pts head was leaning against door and staff was concerned that the pt hit his head. Pt is taking Eliquis. Yesterday was pts first day at [facility] - previously lived with and cared for by wife. Dementia at baseline - pt alert and only oriented to person at this time. Yellow socks and yellow fall bracelet placed on pt at this time. Bed fall alarm in place under pt and turned on/active at this time. Son at bedside [facility]) and will be available to transport pt back to [facility] if able to be discharged - contact with phone number added to pts chart.

## 2022-08-31 NOTE — ED Provider Notes (Signed)
[facility] Provider Note    Event Date/Time   First MD Initiated Contact with Patient 08/31/22 [phone]     (approximate)   History   Fall   HPI  Chad Tuberville Pangborn Sr. is a 89 y.o. male  here with weakness, fall. Pt has fallen twice in last 24 hours. He has been more weak than usual. He denies any complaints to me though does state he has to pee several times despite just urinating in his briefs. Denies any other complaints but history is limited 2/2 confusion.        Physical Exam   Triage Vital Signs: ED Triage Vitals  Enc Vitals Group     BP 08/31/22 0200 121/64     Pulse Rate 08/31/22 0200 79     Resp 08/31/22 0200 17     Temp 08/31/22 0200 98 F (36.7 C)     Temp Source 08/31/22 0200 Oral     SpO2 08/31/22 0154 98 %     Weight 08/31/22 0200 122 lb 11.2 oz (55.7 kg)     Height 08/31/22 0209 5\' 9"  (1.753 m)     Head Circumference --      Peak Flow --      Pain Score 08/31/22 0209 0     Pain Loc --      Pain Edu? --      Excl. in GC? --     Most recent vital signs: Vitals:   08/31/22 0200 08/31/22 0605  BP: 121/64 119/64  Pulse: 79 (!) 142  Resp: 17 18  Temp: 98 F (36.7 C)   SpO2: 95% 91%     General: Awake, no distress.  CV:  Good peripheral perfusion. No murmurs. Resp:  Normal work of breathing. Lungs clear. Abd:  No distention. Mild suprapubic TTP. Other:  Confused but no focal neuro deficits.   ED Results / Procedures / Treatments   Labs (all labs ordered are listed, but only abnormal results are displayed) Labs Reviewed  CBC WITH DIFFERENTIAL/PLATELET - Abnormal; Notable for the following components:      Result Value   WBC 15.8 (*)    RBC 3.92 (*)    Hemoglobin 11.9 (*)    HCT 37.0 (*)    Neutro Abs 13.2 (*)    Monocytes Absolute 1.3 (*)    Abs Immature Granulocytes 0.12 (*)    All other components within normal limits  COMPREHENSIVE METABOLIC PANEL - Abnormal; Notable for the following components:   Glucose,  Bld 142 (*)    BUN 30 (*)    Total Protein 6.4 (*)    All other components within normal limits  CULTURE, BLOOD (SINGLE)  URINALYSIS, ROUTINE W REFLEX MICROSCOPIC  LACTIC ACID, PLASMA  LACTIC ACID, PLASMA     EKG    RADIOLOGY CT Head: NAICA CXR: Pending   I also independently reviewed and agree with radiologist interpretations.   PROCEDURES:  Critical Care performed: No   MEDICATIONS ORDERED IN ED: Medications  sodium chloride 0.9 % bolus 1,000 mL (has no administration in time range)     IMPRESSION / MDM / ASSESSMENT AND PLAN / ED COURSE  I reviewed the triage vital signs and the nursing notes.                              Differential diagnosis includes, but is not limited to, occult sepsis (UTI, PNA), dehydration, ACS, anemia,  deconditioning, polypharmacy  Patient's presentation is most consistent with acute presentation with potential threat to life or bodily function.  The patient is on the cardiac monitor to evaluate for evidence of arrhythmia and/or significant heart rate changes  89 yo M here with generalized weakness, falls x 2 in 24 hours. Suspect occult infection, particularly UTI given report of urinary frequency/urgency. I&O ordered. CBC shows moderate leukocytosis. BMP with elevated BUN/Cr ratio c/w dehydration. IVF ordered. CT Head/C Spine negative. CXR pending. Plan to re-assess after UA, fluids. No family at bedside at time of my evaluation.    FINAL CLINICAL IMPRESSION(S) / ED DIAGNOSES   Final diagnoses:  Fall, initial encounter  Leukocytosis, unspecified type     Rx / DC Orders   ED Discharge Orders     None        Note:  This document was prepared using Dragon voice recognition software and may include unintentional dictation errors.   Shaune Pollack, MD 08/31/22 [phone]

## 2022-08-31 NOTE — ED Triage Notes (Signed)
EMS brings pt in from [facility]; 2 falls, currently taking eliquist; no c/o

## 2022-08-31 NOTE — ED Notes (Signed)
Patient transported to CT 

## 2022-08-31 NOTE — ED Notes (Signed)
Assisted MD with fecal disimpaction of rectum. Pt tolerated with mild pain.

## 2022-09-02 LAB — CULTURE, BLOOD (SINGLE): Special Requests: ADEQUATE

## 2022-09-05 LAB — CULTURE, BLOOD (SINGLE): Culture: NO GROWTH

## 2022-09-09 ENCOUNTER — Inpatient Hospital Stay
Admission: EM | Admit: 2022-09-09 | Discharge: 2022-09-14 | DRG: 481 | Disposition: A | Discharge status: Skilled Nursing Facility | Payer: Medicare Other | Source: Skilled Nursing Facility | Attending: Internal Medicine | Admitting: Internal Medicine

## 2022-09-09 ENCOUNTER — Inpatient Hospital Stay: Payer: Medicare Other

## 2022-09-09 ENCOUNTER — Emergency Department: Payer: Medicare Other

## 2022-09-09 ENCOUNTER — Emergency Department: Payer: Medicare Other | Admitting: Certified Registered Nurse Anesthetist

## 2022-09-09 ENCOUNTER — Other Ambulatory Visit: Payer: Self-pay

## 2022-09-09 ENCOUNTER — Encounter: Payer: Self-pay | Admitting: Emergency Medicine

## 2022-09-09 ENCOUNTER — Encounter
Admission: EM | Disposition: A | Discharge status: Skilled Nursing Facility | Payer: Self-pay | Source: Skilled Nursing Facility | Attending: Internal Medicine

## 2022-09-09 DIAGNOSIS — Z79899 Other long term (current) drug therapy: Secondary | ICD-10-CM | POA: Diagnosis not present

## 2022-09-09 DIAGNOSIS — Z681 Body mass index (BMI) 19 or less, adult: Secondary | ICD-10-CM | POA: Diagnosis not present

## 2022-09-09 DIAGNOSIS — W19XXXA Unspecified fall, initial encounter: Secondary | ICD-10-CM | POA: Diagnosis present

## 2022-09-09 DIAGNOSIS — F01C0 Vascular dementia, severe, without behavioral disturbance, psychotic disturbance, mood disturbance, and anxiety: Secondary | ICD-10-CM | POA: Diagnosis not present

## 2022-09-09 DIAGNOSIS — E1169 Type 2 diabetes mellitus with other specified complication: Secondary | ICD-10-CM

## 2022-09-09 DIAGNOSIS — R64 Cachexia: Secondary | ICD-10-CM | POA: Diagnosis present

## 2022-09-09 DIAGNOSIS — I959 Hypotension, unspecified: Secondary | ICD-10-CM | POA: Diagnosis present

## 2022-09-09 DIAGNOSIS — Z9861 Coronary angioplasty status: Secondary | ICD-10-CM | POA: Diagnosis not present

## 2022-09-09 DIAGNOSIS — E440 Moderate protein-calorie malnutrition: Secondary | ICD-10-CM | POA: Diagnosis present

## 2022-09-09 DIAGNOSIS — E119 Type 2 diabetes mellitus without complications: Secondary | ICD-10-CM | POA: Diagnosis present

## 2022-09-09 DIAGNOSIS — S72001A Fracture of unspecified part of neck of right femur, initial encounter for closed fracture: Secondary | ICD-10-CM

## 2022-09-09 DIAGNOSIS — Y92129 Unspecified place in nursing home as the place of occurrence of the external cause: Secondary | ICD-10-CM

## 2022-09-09 DIAGNOSIS — D62 Acute posthemorrhagic anemia: Secondary | ICD-10-CM | POA: Diagnosis not present

## 2022-09-09 DIAGNOSIS — Z7982 Long term (current) use of aspirin: Secondary | ICD-10-CM | POA: Diagnosis not present

## 2022-09-09 DIAGNOSIS — Z8673 Personal history of transient ischemic attack (TIA), and cerebral infarction without residual deficits: Secondary | ICD-10-CM

## 2022-09-09 DIAGNOSIS — Z87891 Personal history of nicotine dependence: Secondary | ICD-10-CM

## 2022-09-09 DIAGNOSIS — D509 Iron deficiency anemia, unspecified: Secondary | ICD-10-CM | POA: Diagnosis present

## 2022-09-09 DIAGNOSIS — S72142A Displaced intertrochanteric fracture of left femur, initial encounter for closed fracture: Secondary | ICD-10-CM | POA: Diagnosis present

## 2022-09-09 DIAGNOSIS — I2510 Atherosclerotic heart disease of native coronary artery without angina pectoris: Secondary | ICD-10-CM | POA: Diagnosis present

## 2022-09-09 DIAGNOSIS — S72002D Fracture of unspecified part of neck of left femur, subsequent encounter for closed fracture with routine healing: Secondary | ICD-10-CM | POA: Diagnosis not present

## 2022-09-09 DIAGNOSIS — N400 Enlarged prostate without lower urinary tract symptoms: Secondary | ICD-10-CM | POA: Diagnosis present

## 2022-09-09 DIAGNOSIS — S72009A Fracture of unspecified part of neck of unspecified femur, initial encounter for closed fracture: Secondary | ICD-10-CM | POA: Diagnosis present

## 2022-09-09 DIAGNOSIS — D638 Anemia in other chronic diseases classified elsewhere: Secondary | ICD-10-CM | POA: Diagnosis not present

## 2022-09-09 DIAGNOSIS — F0150 Vascular dementia without behavioral disturbance: Secondary | ICD-10-CM | POA: Diagnosis present

## 2022-09-09 HISTORY — PX: INTRAMEDULLARY (IM) NAIL INTERTROCHANTERIC: SHX5875

## 2022-09-09 LAB — COMPREHENSIVE METABOLIC PANEL
ALT: 24 U/L (ref 0–44)
AST: 30 U/L (ref 15–41)
Albumin: 2.5 g/dL — ABNORMAL LOW (ref 3.5–5.0)
Alkaline Phosphatase: 75 U/L (ref 38–126)
Anion gap: 8 (ref 5–15)
BUN: 20 mg/dL (ref 8–23)
CO2: 27 mmol/L (ref 22–32)
Calcium: 8.4 mg/dL — ABNORMAL LOW (ref 8.9–10.3)
Chloride: 102 mmol/L (ref 98–111)
Creatinine, Ser: 0.82 mg/dL (ref 0.61–1.24)
GFR, Estimated: >60 mL/min (ref >60)
Glucose, Bld: 106 mg/dL — ABNORMAL HIGH (ref 70–99)
Potassium: 4.2 mmol/L (ref 3.5–5.1)
Sodium: 137 mmol/L (ref 135–145)
Total Bilirubin: 1.1 mg/dL (ref 0.3–1.2)
Total Protein: 5.6 g/dL — ABNORMAL LOW (ref 6.5–8.1)

## 2022-09-09 LAB — CBC WITH DIFFERENTIAL/PLATELET
Abs Immature Granulocytes: 0.07 10*3/uL (ref 0.00–0.07)
Basophils Absolute: 0 10*3/uL (ref 0.0–0.1)
Basophils Relative: 1 %
Eosinophils Absolute: 0.4 10*3/uL (ref 0.0–0.5)
Eosinophils Relative: 4 %
HCT: 32.6 % — ABNORMAL LOW (ref 39.0–52.0)
Hemoglobin: 10.3 g/dL — ABNORMAL LOW (ref 13.0–17.0)
Immature Granulocytes: 1 %
Lymphocytes Relative: 18 %
Lymphs Abs: 1.6 10*3/uL (ref 0.7–4.0)
MCH: 29.9 pg (ref 26.0–34.0)
MCHC: 31.6 g/dL (ref 30.0–36.0)
MCV: 94.5 fL (ref 80.0–100.0)
Monocytes Absolute: 0.8 10*3/uL (ref 0.1–1.0)
Monocytes Relative: 9 %
Neutro Abs: 5.9 10*3/uL (ref 1.7–7.7)
Neutrophils Relative %: 67 %
Platelets: 350 10*3/uL (ref 150–400)
RBC: 3.45 MIL/uL — ABNORMAL LOW (ref 4.22–5.81)
RDW: 13.8 % (ref 11.5–15.5)
WBC: 8.8 10*3/uL (ref 4.0–10.5)
nRBC: 0 % (ref 0.0–0.2)

## 2022-09-09 LAB — TYPE AND SCREEN
ABO/RH(D): O POS
Antibody Screen: NEGATIVE

## 2022-09-09 LAB — GLUCOSE, CAPILLARY: Glucose-Capillary: 115 mg/dL — ABNORMAL HIGH (ref 70–99)

## 2022-09-09 LAB — ABO/RH: ABO/RH(D): O POS

## 2022-09-09 SURGERY — FIXATION, FRACTURE, INTERTROCHANTERIC, WITH INTRAMEDULLARY ROD
Anesthesia: General | Site: Hip | Laterality: Left

## 2022-09-09 MED ORDER — SODIUM CHLORIDE 0.9 % IV SOLN: INTRAVENOUS | Status: DC

## 2022-09-09 MED ORDER — SERTRALINE HCL 50 MG PO TABS
100.0000 mg | ORAL_TABLET | Freq: Every day | ORAL | Status: DC
  Administered 2022-09-10 – 2022-09-14 (×5): 100 mg via ORAL
  Filled 2022-09-09 (×5): qty 2

## 2022-09-09 MED ORDER — LIDOCAINE HCL (CARDIAC) PF 100 MG/5ML IV SOSY
PREFILLED_SYRINGE | INTRAVENOUS | Status: DC | PRN
  Administered 2022-09-09: 50 mg via INTRAVENOUS

## 2022-09-09 MED ORDER — MIDODRINE HCL 5 MG PO TABS
10.0000 mg | ORAL_TABLET | Freq: Three times a day (TID) | ORAL | Status: DC
  Administered 2022-09-09 – 2022-09-14 (×14): 10 mg via ORAL
  Filled 2022-09-09 (×13): qty 2

## 2022-09-09 MED ORDER — ENOXAPARIN SODIUM 40 MG/0.4ML IJ SOSY: 40.0000 mg | PREFILLED_SYRINGE | INTRAMUSCULAR | Status: DC

## 2022-09-09 MED ORDER — MORPHINE SULFATE (PF) 2 MG/ML IV SOLN: 0.5000 mg | INTRAVENOUS | Status: DC | PRN

## 2022-09-09 MED ORDER — TRAMADOL HCL 50 MG PO TABS
50.0000 mg | ORAL_TABLET | Freq: Four times a day (QID) | ORAL | Status: DC | PRN
  Administered 2022-09-12 (×2): 50 mg via ORAL
  Filled 2022-09-09 (×2): qty 1

## 2022-09-09 MED ORDER — HYDROMORPHONE HCL 1 MG/ML IJ SOLN: 0.5000 mg | INTRAMUSCULAR | Status: DC | PRN

## 2022-09-09 MED ORDER — ONDANSETRON HCL 4 MG PO TABS: 4.0000 mg | ORAL_TABLET | Freq: Four times a day (QID) | ORAL | Status: DC | PRN

## 2022-09-09 MED ORDER — ATORVASTATIN CALCIUM 20 MG PO TABS
40.0000 mg | ORAL_TABLET | Freq: Every day | ORAL | Status: DC
  Administered 2022-09-10 – 2022-09-14 (×5): 40 mg via ORAL
  Filled 2022-09-09 (×5): qty 2

## 2022-09-09 MED ORDER — DOCUSATE SODIUM 100 MG PO CAPS
100.0000 mg | ORAL_CAPSULE | Freq: Two times a day (BID) | ORAL | Status: DC
  Administered 2022-09-09 – 2022-09-14 (×10): 100 mg via ORAL
  Filled 2022-09-09 (×10): qty 1

## 2022-09-09 MED ORDER — BUPIVACAINE-EPINEPHRINE (PF) 0.25% -1:200000 IJ SOLN
INTRAMUSCULAR | Status: DC | PRN
  Administered 2022-09-09: 30 mL

## 2022-09-09 MED ORDER — DEXAMETHASONE SODIUM PHOSPHATE 10 MG/ML IJ SOLN
INTRAMUSCULAR | Status: DC | PRN
  Administered 2022-09-09: 4 mg via INTRAVENOUS

## 2022-09-09 MED ORDER — ONDANSETRON HCL 4 MG/2ML IJ SOLN: 4.0000 mg | Freq: Four times a day (QID) | INTRAMUSCULAR | Status: DC | PRN

## 2022-09-09 MED ORDER — ONDANSETRON HCL 4 MG/2ML IJ SOLN
INTRAMUSCULAR | Status: DC | PRN
  Administered 2022-09-09: 4 mg via INTRAVENOUS

## 2022-09-09 MED ORDER — SUGAMMADEX SODIUM 200 MG/2ML IV SOLN
INTRAVENOUS | Status: DC | PRN
  Administered 2022-09-09: 200 mg via INTRAVENOUS

## 2022-09-09 MED ORDER — DONEPEZIL HCL 5 MG PO TABS
10.0000 mg | ORAL_TABLET | Freq: Every day | ORAL | Status: DC
  Administered 2022-09-09 – 2022-09-13 (×5): 10 mg via ORAL
  Filled 2022-09-09 (×5): qty 2

## 2022-09-09 MED ORDER — PHENOL 1.4 % MT LIQD: 1.0000 | OROMUCOSAL | Status: DC | PRN

## 2022-09-09 MED ORDER — MENTHOL 3 MG MT LOZG: 1.0000 | LOZENGE | OROMUCOSAL | Status: DC | PRN

## 2022-09-09 MED ORDER — ROCURONIUM BROMIDE 100 MG/10ML IV SOLN
INTRAVENOUS | Status: DC | PRN
  Administered 2022-09-09: 40 mg via INTRAVENOUS
  Administered 2022-09-09: 10 mg via INTRAVENOUS

## 2022-09-09 MED ORDER — FENTANYL CITRATE (PF) 100 MCG/2ML IJ SOLN
INTRAMUSCULAR | Status: DC | PRN
  Administered 2022-09-09: 50 ug via INTRAVENOUS

## 2022-09-09 MED ORDER — CEFAZOLIN SODIUM-DEXTROSE 2-3 GM-%(50ML) IV SOLR
INTRAVENOUS | Status: DC | PRN
  Administered 2022-09-09: 2 g via INTRAVENOUS

## 2022-09-09 MED ORDER — ENOXAPARIN SODIUM 40 MG/0.4ML IJ SOSY
40.0000 mg | PREFILLED_SYRINGE | INTRAMUSCULAR | Status: DC
  Administered 2022-09-10 – 2022-09-14 (×5): 40 mg via SUBCUTANEOUS
  Filled 2022-09-09 (×5): qty 0.4

## 2022-09-09 MED ORDER — TRANEXAMIC ACID-NACL 1000-0.7 MG/100ML-% IV SOLN
INTRAVENOUS | Status: DC | PRN
  Administered 2022-09-09: 1000 mg via INTRAVENOUS

## 2022-09-09 MED ORDER — CEFAZOLIN SODIUM-DEXTROSE 2-4 GM/100ML-% IV SOLN
2.0000 g | Freq: Four times a day (QID) | INTRAVENOUS | Status: AC
  Administered 2022-09-09 – 2022-09-10 (×2): 2 g via INTRAVENOUS
  Filled 2022-09-09 (×2): qty 100

## 2022-09-09 MED ORDER — FENTANYL CITRATE PF 50 MCG/ML IJ SOSY
50.0000 ug | PREFILLED_SYRINGE | INTRAMUSCULAR | Status: DC | PRN
  Administered 2022-09-09: 50 ug via INTRAVENOUS
  Filled 2022-09-09: qty 1

## 2022-09-09 MED ORDER — LAMOTRIGINE 25 MG PO TABS
200.0000 mg | ORAL_TABLET | Freq: Every day | ORAL | Status: DC
  Administered 2022-09-10 – 2022-09-14 (×5): 200 mg via ORAL
  Filled 2022-09-09 (×5): qty 8

## 2022-09-09 MED ORDER — CEFAZOLIN SODIUM-DEXTROSE 2-4 GM/100ML-% IV SOLN
INTRAVENOUS | Status: AC
  Filled 2022-09-09: qty 100

## 2022-09-09 MED ORDER — 0.9 % SODIUM CHLORIDE (POUR BTL) OPTIME
TOPICAL | Status: DC | PRN
  Administered 2022-09-09: 500 mL

## 2022-09-09 MED ORDER — ACETAMINOPHEN 500 MG PO TABS
500.0000 mg | ORAL_TABLET | Freq: Four times a day (QID) | ORAL | Status: AC
  Administered 2022-09-09 – 2022-09-10 (×4): 500 mg via ORAL
  Filled 2022-09-09 (×4): qty 1

## 2022-09-09 MED ORDER — FENTANYL CITRATE (PF) 100 MCG/2ML IJ SOLN: 25.0000 ug | INTRAMUSCULAR | Status: DC | PRN

## 2022-09-09 MED ORDER — PROPOFOL 10 MG/ML IV BOLUS
INTRAVENOUS | Status: DC | PRN
  Administered 2022-09-09: 70 mg via INTRAVENOUS

## 2022-09-09 MED ORDER — METOCLOPRAMIDE HCL 5 MG/ML IJ SOLN: 5.0000 mg | Freq: Three times a day (TID) | INTRAMUSCULAR | Status: DC | PRN

## 2022-09-09 MED ORDER — ACETAMINOPHEN 10 MG/ML IV SOLN
INTRAVENOUS | Status: AC
  Filled 2022-09-09: qty 100

## 2022-09-09 MED ORDER — QUETIAPINE FUMARATE 25 MG PO TABS
25.0000 mg | ORAL_TABLET | Freq: Two times a day (BID) | ORAL | Status: DC
  Administered 2022-09-09 – 2022-09-14 (×10): 25 mg via ORAL
  Filled 2022-09-09 (×10): qty 1

## 2022-09-09 MED ORDER — EPINEPHRINE PF 1 MG/ML IJ SOLN
INTRAMUSCULAR | Status: AC
  Filled 2022-09-09: qty 1

## 2022-09-09 MED ORDER — METOCLOPRAMIDE HCL 5 MG PO TABS: 5.0000 mg | ORAL_TABLET | Freq: Three times a day (TID) | ORAL | Status: DC | PRN

## 2022-09-09 MED ORDER — ONDANSETRON HCL 4 MG/2ML IJ SOLN: 4.0000 mg | Freq: Once | INTRAMUSCULAR | Status: DC | PRN

## 2022-09-09 MED ORDER — ACETAMINOPHEN 10 MG/ML IV SOLN
INTRAVENOUS | Status: DC | PRN
  Administered 2022-09-09: 1000 mg via INTRAVENOUS

## 2022-09-09 MED ORDER — PHENYLEPHRINE HCL (PRESSORS) 10 MG/ML IV SOLN
INTRAVENOUS | Status: DC | PRN
  Administered 2022-09-09: 80 ug via INTRAVENOUS

## 2022-09-09 MED ORDER — HYDROCODONE-ACETAMINOPHEN 5-325 MG PO TABS: 1.0000 | ORAL_TABLET | ORAL | Status: DC | PRN

## 2022-09-09 MED ORDER — BUPIVACAINE HCL (PF) 0.25 % IJ SOLN
INTRAMUSCULAR | Status: AC
  Filled 2022-09-09: qty 30

## 2022-09-09 MED ORDER — FENTANYL CITRATE (PF) 100 MCG/2ML IJ SOLN
INTRAMUSCULAR | Status: AC
  Filled 2022-09-09: qty 2

## 2022-09-09 SURGICAL SUPPLY — 46 items
BIT DRILL CALIBRATED 4.2 (BIT) IMPLANT
BIT DRILL CANN 16 HIP (BIT) IMPLANT
BIT DRILL CANN STP 6/9 HIP (BIT) IMPLANT
BIT DRILL TAPERED 10 (BIT) IMPLANT
BLADE TFNA HELICAL 100 NS (Anchor) IMPLANT
BNDG COHESIVE 6X5 TAN ST LF (GAUZE/BANDAGES/DRESSINGS) ×1 IMPLANT
CHLORAPREP W/TINT 26 (MISCELLANEOUS) ×1 IMPLANT
DERMABOND ADVANCED .7 DNX12 (GAUZE/BANDAGES/DRESSINGS) ×1 IMPLANT
DRAPE 3/4 80X56 (DRAPES) ×1 IMPLANT
DRILL BIT CALIBRATED 4.2 (BIT) ×1
DRSG OPSITE POSTOP 3X4 (GAUZE/BANDAGES/DRESSINGS) IMPLANT
DRSG OPSITE POSTOP 4X6 (GAUZE/BANDAGES/DRESSINGS) IMPLANT
ELECT CAUTERY BLADE 6.4 (BLADE) IMPLANT
ELECT REM PT RETURN 9FT ADLT (ELECTROSURGICAL) ×1
ELECTRODE REM PT RTRN 9FT ADLT (ELECTROSURGICAL) IMPLANT
GLOVE PI ORTHO PRO STRL 7.5 (GLOVE) ×2 IMPLANT
GLOVE SURG SYN 7.5  E (GLOVE) ×1
GLOVE SURG SYN 7.5 E (GLOVE) ×1 IMPLANT
GLOVE SURG SYN 7.5 PF PI (GLOVE) ×1 IMPLANT
GOWN SRG XL LVL 3 NONREINFORCE (GOWNS) ×1 IMPLANT
GOWN STRL NON-REIN TWL XL LVL3 (GOWNS) ×1
GOWN STRL REUS W/ TWL LRG LVL3 (GOWN DISPOSABLE) ×1 IMPLANT
GOWN STRL REUS W/TWL LRG LVL3 (GOWN DISPOSABLE) ×1
GUIDEWIRE 3.2X400 (WIRE) IMPLANT
HANDLE YANKAUER SUCT OPEN TIP (MISCELLANEOUS) ×1 IMPLANT
KIT PATIENT CARE HANA TABLE (KITS) ×1 IMPLANT
MANIFOLD NEPTUNE II (INSTRUMENTS) ×1 IMPLANT
MAT ABSORB  FLUID 56X50 GRAY (MISCELLANEOUS) ×1
MAT ABSORB FLUID 56X50 GRAY (MISCELLANEOUS) ×1 IMPLANT
NAIL TROCH FIX 10X170 130 (Nail) IMPLANT
NDL HYPO 21X1.5 SAFETY (NEEDLE) IMPLANT
NEEDLE HYPO 21X1.5 SAFETY (NEEDLE) ×1 IMPLANT
NS IRRIG 500ML POUR BTL (IV SOLUTION) ×1 IMPLANT
PACK HIP COMPR (MISCELLANEOUS) ×1 IMPLANT
PAD ARMBOARD 7.5X6 YLW CONV (MISCELLANEOUS) ×1 IMPLANT
SCREW LOCK STAR 5X38 (Screw) IMPLANT
SLEEVE SCD COMPRESS KNEE MED (STOCKING) ×1 IMPLANT
SUT QUILL MONODERM 3-0 PS-2 (SUTURE) ×1 IMPLANT
SUT VIC AB 1 CT1 36 (SUTURE) ×1 IMPLANT
SUT VIC AB 2-0 CT2 27 (SUTURE) ×1 IMPLANT
SYR 30ML LL (SYRINGE) ×1 IMPLANT
TAPE CLOTH 3X10 WHT NS LF (GAUZE/BANDAGES/DRESSINGS) IMPLANT
TRAP FLUID SMOKE EVACUATOR (MISCELLANEOUS) IMPLANT
TRAY FOLEY SLVR 16FR LF STAT (SET/KITS/TRAYS/PACK) IMPLANT
WATER STERILE IRR 1000ML POUR (IV SOLUTION) IMPLANT
WATER STERILE IRR 500ML POUR (IV SOLUTION) ×1 IMPLANT

## 2022-09-09 NOTE — H&P (Signed)
History and Physical    Patient: Chad LESAR Sr. ZOX:[id number] DOB: 1933/12/14 DOA: 09/09/2022 DOS: the patient was seen and examined on 09/09/2022 PCP: Tessie Fass, MD  Patient coming from: SNF  Chief Complaint:  Chief Complaint  Patient presents with   Fall   HPI: Chad Pattee Ohlinger Sr. is a 89 y.o. male with medical history significant of coronary artery disease, vascular dementia, history of CVA, type 2 diabetes, history of seizures presenting with left hip fracture.  History primarily from patient's son in the setting of end-stage dementia.  Per report, patient had a fall at facility approximately 3 to 4 days ago.  No reported head trauma loss consciousness associated with fall.  Patient minimally ambulating at baseline.  Son who is an EMT evaluated patient and noted that his left leg was shortened and internally rotation with mild bruising.  Upon evaluating patient was unable to ambulate though was noted to have some degree of ambulation with movement with other family seeing him.  No fevers or chills.  No nausea or vomiting.  No hemiparesis.  Baseline generalized confusion. Presented to the ER afebrile, hemodynamically stable.  White count 8.8, hemoglobin 10.3, creatinine 0.8.  CT head within normal limits.  Chest x-ray stable.  Left hip plain films which showed an comminuted and displaced intertrochanteric fracture.  Per Dr. Roxan Hockey in the ER, case discussed with on-call orthopedic surgeon Dr. Audelia Acton who will evaluate the patient. Review of Systems: As mentioned in the history of present illness. All other systems reviewed and are negative. Past Medical History:  Diagnosis Date   Coronary artery disease    Depression    Diabetes mellitus without complication (HCC)    Seizures (HCC)    Stroke (HCC)    Vascular dementia [facility])    Past Surgical History:  Procedure Laterality Date   CARDIAC SURGERY     Social History:  reports that he has quit smoking. He has never used  smokeless tobacco. He reports that he does not drink alcohol and does not use drugs.  No Known Allergies  History reviewed. No pertinent family history.  Prior to Admission medications   Medication Sig Start Date End Date Taking? Authorizing Provider  aspirin 81 MG chewable tablet Chew 81 mg by mouth daily.   Yes [provider]  atorvastatin (LIPITOR) 40 MG tablet Take 40 mg by mouth daily.   Yes [provider]  cholecalciferol (VITAMIN D3) 25 MCG (1000 UNIT) tablet Take 2,000 Units by mouth daily.   Yes [provider]  donepezil (ARICEPT) 10 MG tablet Take 10 mg by mouth at bedtime.   Yes [provider]  lamoTRIgine (LAMICTAL) 200 MG tablet Take 200 mg by mouth daily.   Yes [provider]  midodrine (PROAMATINE) 10 MG tablet Take 10 mg by mouth 3 (three) times daily.   Yes [provider]  QUEtiapine (SEROQUEL) 25 MG tablet Take 25 mg by mouth 2 (two) times daily. 05/28/21  Yes [provider]  sertraline (ZOLOFT) 100 MG tablet Take 100 mg by mouth daily. 05/22/21  Yes [provider]  traMADol (ULTRAM) 50 MG tablet Take 50 mg by mouth every 8 (eight) hours as needed for moderate pain. 09/08/22  Yes [provider]  finasteride (PROSCAR) 5 MG tablet Take 5 mg by mouth daily.    [provider]  isosorbide mononitrate (IMDUR) 30 MG 24 hr tablet Take 30 mg by mouth daily.    [provider]  Physical Exam: Vitals:   09/09/22 1026 09/09/22 1027 09/09/22 1300  BP: 137/64  130/68  Pulse: 68  70  Resp: 17  16  Temp: 98.9 F (37.2 C)    TempSrc: Oral    SpO2: 92%  92%  Weight:  56 kg   Height:  5\' 9"  (1.753 m)    Physical Exam Constitutional:      Comments: Underweight   HENT:     Head: Normocephalic and atraumatic.     Nose: Nose normal.     Mouth/Throat:     Mouth: Mucous membranes are dry.  Eyes:     Pupils: Pupils are equal, round, and reactive to light.  Cardiovascular:      Rate and Rhythm: Normal rate. Rhythm irregular.  Pulmonary:     Effort: Pulmonary effort is normal.  Abdominal:     General: Abdomen is flat. Bowel sounds are normal.  Musculoskeletal:     Cervical back: Normal range of motion.     Comments: Left lower extremity internally rotated and shortened with mild bruising on the medial/posterior thigh  Skin:    Findings: Bruising present.  Neurological:     Comments: Positive generalized confusion Otherwise nonfocal exam  Psychiatric:        Mood and Affect: Mood normal.     Data Reviewed:  There are no new results to review at this time. DG Chest Portable 1 View CLINICAL DATA:  Preoperative respiratory evaluation.  EXAM: PORTABLE CHEST 1 VIEW  COMPARISON:  None Available.  FINDINGS: The lungs are clear without focal pneumonia, edema, pneumothorax or pleural effusion. Interstitial markings are diffusely coarsened with chronic features. The cardiopericardial silhouette is within normal limits for size. Status post CABG. Bones are diffusely demineralized.The heart size and mediastinal contours are within normal limits. Both lungs are clear. The visualized skeletal structures are unremarkable.  IMPRESSION: No active disease.  Electronically Signed   By: Kennith Center M.D.   On: 09/09/2022 12:32 DG FEMUR MIN 2 VIEWS LEFT CLINICAL DATA:  Fall with injury to left hip area.  EXAM: LEFT FEMUR 2 VIEWS  COMPARISON:  CT abdomen/pelvis 08/31/2022.  FINDINGS: There is a comminuted and mildly proximally displaced intertrochanteric fracture. There is approximately 1.3 cm medial displacement of the lesser trochanter. Femoroacetabular alignment is maintained. The symphysis pubis is maintained.  Extensive vascular calcifications are noted.  IMPRESSION: Comminuted and displaced intertrochanteric fracture as above.  Electronically Signed   By: Lesia Hausen M.D.   On: 09/09/2022 12:30 CT HEAD WO CONTRAST ( ) CLINICAL DATA:   Fall yesterday.  EXAM: CT HEAD WITHOUT CONTRAST  TECHNIQUE: Contiguous axial images were obtained from the base of the skull through the vertex without intravenous contrast.  RADIATION DOSE REDUCTION: This exam was performed according to the departmental dose-optimization program which includes automated exposure control, adjustment of the mA and/or kV according to patient size and/or use of iterative reconstruction technique.  COMPARISON:  CT head 08/31/2022.  FINDINGS: Brain: There is no acute intracranial hemorrhage, extra-axial fluid collection, or acute infarct.  Remote infarcts in the right MCA distribution with ex vacuo dilatation of the right lateral ventricle are unchanged. The ventricles are stable in size. Gray-white differentiation is otherwise preserved. Additional hypodensity in the supratentorial white matter consistent with underlying chronic small-vessel ischemic change is stable.  The pituitary and suprasellar region are normal. There is no mass lesion. There is no mass effect or midline shift.  Vascular: There is calcification of the bilateral carotid siphons and vertebral arteries.  Skull: Normal. Negative for fracture or focal lesion.  Sinuses/Orbits: The imaged paranasal sinuses are clear. The mastoid air cells are clear. Bilateral lens implants are in place. The globes and orbits are otherwise unremarkable.  Other: None.  IMPRESSION: Stable noncontrast head CT with no acute intracranial pathology.  Electronically Signed   By: Lesia Hausen M.D.   On: 09/09/2022 12:04  Lab Results  Component Value Date   WBC 8.8 09/09/2022   HGB 10.3 (L) 09/09/2022   HCT 32.6 (L) 09/09/2022   MCV 94.5 09/09/2022   PLT 350 09/09/2022   Last metabolic panel Lab Results  Component Value Date   GLUCOSE 106 (H) 09/09/2022   NA 137 09/09/2022   K 4.2 09/09/2022   CL 102 09/09/2022   CO2 27 09/09/2022   BUN 20 09/09/2022   CREATININE 0.82 09/09/2022    GFRNONAA >60 09/09/2022   CALCIUM 8.4 (L) 09/09/2022   PROT 5.6 (L) 09/09/2022   ALBUMIN 2.5 (L) 09/09/2022   BILITOT 1.1 09/09/2022   ALKPHOS 75 09/09/2022   AST 30 09/09/2022   ALT 24 09/09/2022   ANIONGAP 8 09/09/2022    Assessment and Plan: * Hip fracture (HCC) + recent fall w/ Comminuted and displaced intertrochanteric fracture on imaging Pain control  Per Dr. Roxan Hockey, case discussed w/ on call ortho who will plan to evaluate for possible operative intervention  Pain control  F/u ortho recs.    BPH (benign prostatic hyperplasia) Cont proscar   CAD S/P percutaneous coronary angioplasty No active CP  Cont home regimen    Vascular dementia (HCC) Baseline end stage dementia  Pt not oriented to person, place, time  Cont home regimen  Follow   Diabetes mellitus type 2, uncomplicated (HCC) SSI as needed       Advance Care Planning:   Code Status: Full Code   Consults: Dr. Audelia Acton w/ orthopedics per Dr. Roxan Hockey   Family Communication: Son and wife at the bedside   Severity of Illness: The appropriate patient status for this patient is INPATIENT. Inpatient status is judged to be reasonable and necessary in order to provide the required intensity of service to ensure the patient's safety. The patient's presenting symptoms, physical exam findings, and initial radiographic and laboratory data in the context of their chronic comorbidities is felt to place them at high risk for further clinical deterioration. Furthermore, it is not anticipated that the patient will be medically stable for discharge from the hospital within 2 midnights of admission.   * I certify that at the point of admission it is my clinical judgment that the patient will require inpatient hospital care spanning beyond 2 midnights from the point of admission due to high intensity of service, high risk for further deterioration and high frequency of surveillance required.*  Author: Floydene Flock,  MD 09/09/2022 1:30 PM  For on call review [email or web address].

## 2022-09-09 NOTE — ED Notes (Signed)
Family at bedside. 

## 2022-09-09 NOTE — Assessment & Plan Note (Signed)
Baseline end stage dementia  Pt not oriented to person, place, time  Cont home regimen  Follow

## 2022-09-09 NOTE — Interval H&P Note (Signed)
Patient history and physical updated. Consent reviewed including risks, benefits, and alternatives to surgery. Patient and his family agrees with above plan to proceed with left hip intramedullary nail

## 2022-09-09 NOTE — ED Triage Notes (Signed)
Presents via EMS from [facility]   Per EMS he fell couple of days ago  Injury to left hip area  Deformity noted to left leg with swelling and bruising noted

## 2022-09-09 NOTE — Transfer of Care (Signed)
Immediate Anesthesia Transfer of Care Note  Patient: Chad Mardis Ebron Sr.  Procedure(s) Performed: INTRAMEDULLARY (IM) NAIL INTERTROCHANTERIC (Left: Hip)  Patient Location: PACU  Anesthesia Type:General  Level of Consciousness: drowsy  Airway & Oxygen Therapy: Patient Spontanous Breathing and Patient connected to face mask oxygen  Post-op Assessment: Report given to RN and Post -op Vital signs reviewed and stable  Post vital signs: Reviewed and stable  Last Vitals:  Vitals Value Taken Time  BP 121/60 09/09/22 1531  Temp    Pulse 65 09/09/22 1535  Resp 15 09/09/22 1535  SpO2 100 % 09/09/22 1535  Vitals shown include unvalidated device data.  Last Pain:  Vitals:   09/09/22 1303  TempSrc:   PainSc: 2          Complications: No notable events documented.

## 2022-09-09 NOTE — Progress Notes (Signed)
Spoke with son Tamer Greim over the phone to complete pt admission history and given update of pt status after surgery.

## 2022-09-09 NOTE — H&P (View-Only) (Signed)
ORTHOPAEDIC CONSULTATION  REQUESTING PHYSICIAN: Floydene Flock, MD  Chief Complaint:   Left intertrochanteric femur fracture  History of Present Illness: Chad Wondra Liles Sr. is a 89 y.o. male with a past medical history of dementia who presented to the emergency room today from [facility].  The patient is with his son and wife who reported he had a fall about 3 days ago.  The patient is unable to provide much additional history due to his dementia.  They report they did not witness any fall but they believe he had one and he had been minimally ambulatory around the house since that time requiring wheelchair to get around and assistance with transfers.  The patient is not a great historian about pain and location of issues but does confirm that the pain he has is on the left side.  Per family they deny any recent illnesses or any acute changes in mental status.  Past Medical History:  Diagnosis Date   Coronary artery disease    Depression    Diabetes mellitus without complication (HCC)    Seizures (HCC)    Stroke (HCC)    Vascular dementia (HCC)    Past Surgical History:  Procedure Laterality Date   CARDIAC SURGERY     Social History   Socioeconomic History   Marital status: Married    Spouse name: Not on file   Number of children: Not on file   Years of education: Not on file   Highest education level: Not on file  Occupational History   Not on file  Tobacco Use   Smoking status: Former   Smokeless tobacco: Never  Substance and Sexual Activity   Alcohol use: No   Drug use: Never   Sexual activity: Not on file  Other Topics Concern   Not on file  Social History Narrative   Not on file   Social Determinants of Health   Financial Resource Strain: Not on file  Food Insecurity: Not on file  Transportation Needs: Not on file  Physical Activity: Not on file  Stress: Not on file  Social Connections: Not on  file   History reviewed. No pertinent family history. No Known Allergies Prior to Admission medications   Medication Sig Start Date End Date Taking? Authorizing Provider  aspirin 81 MG chewable tablet Chew 81 mg by mouth daily.   Yes [provider]  atorvastatin (LIPITOR) 40 MG tablet Take 40 mg by mouth daily.   Yes [provider]  cholecalciferol (VITAMIN D3) 25 MCG (1000 UNIT) tablet Take 2,000 Units by mouth daily.   Yes [provider]  donepezil (ARICEPT) 10 MG tablet Take 10 mg by mouth at bedtime.   Yes [provider]  lamoTRIgine (LAMICTAL) 200 MG tablet Take 200 mg by mouth daily.   Yes [provider]  midodrine (PROAMATINE) 10 MG tablet Take 10 mg by mouth 3 (three) times daily.   Yes [provider]  QUEtiapine (SEROQUEL) 25 MG tablet Take 25 mg by mouth 2 (two) times daily. 05/28/21  Yes [provider]  sertraline (ZOLOFT) 100 MG tablet Take 100 mg by mouth daily. 05/22/21  Yes [provider]  traMADol (ULTRAM) 50 MG tablet Take 50 mg by mouth every 8 (eight) hours as needed for moderate pain. 09/08/22  Yes [provider]  finasteride (PROSCAR) 5 MG tablet Take 5 mg by mouth daily.    [provider]  isosorbide mononitrate (IMDUR) 30 MG 24 hr tablet Take  30 mg by mouth daily.    [provider]   DG Chest Portable 1 View  Result Date: 09/09/2022 CLINICAL DATA:  Preoperative respiratory evaluation. EXAM: PORTABLE CHEST 1 VIEW COMPARISON:  None Available. FINDINGS: The lungs are clear without focal pneumonia, edema, pneumothorax or pleural effusion. Interstitial markings are diffusely coarsened with chronic features. The cardiopericardial silhouette is within normal limits for size. Status post CABG. Bones are diffusely demineralized.The heart size and mediastinal contours are within normal limits. Both lungs are clear. The visualized skeletal structures are unremarkable. IMPRESSION:  No active disease. Electronically Signed   By: Kennith Center M.D.   On: 09/09/2022 12:32   DG FEMUR MIN 2 VIEWS LEFT  Result Date: 09/09/2022 CLINICAL DATA:  Fall with injury to left hip area. EXAM: LEFT FEMUR 2 VIEWS COMPARISON:  CT abdomen/pelvis 08/31/2022. FINDINGS: There is a comminuted and mildly proximally displaced intertrochanteric fracture. There is approximately 1.3 cm medial displacement of the lesser trochanter. Femoroacetabular alignment is maintained. The symphysis pubis is maintained. Extensive vascular calcifications are noted. IMPRESSION: Comminuted and displaced intertrochanteric fracture as above. Electronically Signed   By: Lesia Hausen M.D.   On: 09/09/2022 12:30   CT HEAD WO CONTRAST ( )  Result Date: 09/09/2022 CLINICAL DATA:  Fall yesterday. EXAM: CT HEAD WITHOUT CONTRAST TECHNIQUE: Contiguous axial images were obtained from the base of the skull through the vertex without intravenous contrast. RADIATION DOSE REDUCTION: This exam was performed according to the departmental dose-optimization program which includes automated exposure control, adjustment of the mA and/or kV according to patient size and/or use of iterative reconstruction technique. COMPARISON:  CT head 08/31/2022. FINDINGS: Brain: There is no acute intracranial hemorrhage, extra-axial fluid collection, or acute infarct. Remote infarcts in the right MCA distribution with ex vacuo dilatation of the right lateral ventricle are unchanged. The ventricles are stable in size. Gray-white differentiation is otherwise preserved. Additional hypodensity in the supratentorial white matter consistent with underlying chronic small-vessel ischemic change is stable. The pituitary and suprasellar region are normal. There is no mass lesion. There is no mass effect or midline shift. Vascular: There is calcification of the bilateral carotid siphons and vertebral arteries. Skull: Normal. Negative for fracture or focal lesion. Sinuses/Orbits:  The imaged paranasal sinuses are clear. The mastoid air cells are clear. Bilateral lens implants are in place. The globes and orbits are otherwise unremarkable. Other: None. IMPRESSION: Stable noncontrast head CT with no acute intracranial pathology. Electronically Signed   By: Lesia Hausen M.D.   On: 09/09/2022 12:04    Positive ROS: All other systems have been reviewed and were otherwise negative with the exception of those mentioned in the HPI and as above.  Physical Exam: General:  Alert, no acute distress Psychiatric:  Patient is alert and oriented to name and that he is at a hospital Cardiovascular:  No pedal edema Respiratory:  No wheezing, non-labored breathing GI:  Abdomen is soft and non-tender Skin:  No lesions in the area of chief complaint Neurologic:  Sensation intact distally Lymphatic:  No axillary or cervical lymphadenopathy  Orthopedic Exam:  Left lower extremity Skin intact with swelling and aging ecchymosis over the proximal left thigh and knee No tenderness to palpation around the knee, tender to palpation over the hip No tenderness palpation of the foot ankle or toes Pain with any motion of the left leg Foot warm and well-perfused Sensation intact + dorsalis pedis pulse  Secondary survey No tenderness to palpation over other bony prominences in the lower extremities  or bilateral upper extremities No pain with logroll or simulated axial loading of the right lower extremity All compartments soft No tenderness to palpation over the cervical or thoracic spine, no bony step-off Motor grossly intact throughout, no focal deficits Sensation grossly intact throughout, no focal deficits Good distal pulses and capillary refill on all extremities   X-rays:  X-ray of the left hip and femur report above images reviewed myself which show a comminuted intertrochanteric left femur fracture.  Agree with radiology interpretation.  No other fractures noted in the femur or knee.     Assessment: Left intertrochanteric femur fracture  Plan: [name] is a 89 year old male who presents with a left hip intertrochanteric femur fracture.  A long discussion took place about treatment options and alternatives to treatment with the patient's son and wife.  They have decided on surgical treatment for the hip fracture and the patient will need a left hip intramedullary nail.  A long discussion took place with the patient describing what a intramedullary nail is and what the procedure would entail. The xrays were reviewed with the patient and the implants were discussed. The ability to secure the implant utilizing screws/cement/ or cementless (press fit) fixation was discussed. Surgical exposures were discussed with the patient's son.    The hospitalization and post-operative care and rehabilitation were also discussed. The use of perioperative antibiotics and DVT prophylaxis were discussed. The risk, benefits and alternatives to a surgical intervention were discussed at length with the patient. The patient's son was also advised of risks related to the medical comorbidities. A lengthy discussion took place to review the most common complications including but not limited to: deep vein thrombosis, pulmonary embolus, heart attack, stroke, infection, wound breakdown, dislocation, numbness, leg length in-equality, damage to nerves, intraoperative fracture, malunion/ nonunion, tendon,muscles, arteries or other blood vessels, death and other possible complications from anesthesia. The patient's family was told that we will take steps to minimize these risks by using sterile technique, antibiotics and DVT prophylaxis when appropriate and follow the patient postoperatively in the office setting to monitor progress. The possibility of recurrent pain, no improvement in pain and actual worsening of pain were also discussed with the patient and family.      The benefits of surgery were discussed with the  patient including the potential for improving the patient's current clinical condition through operative intervention. Alternatives to surgical intervention including conservative management were also discussed in detail. All questions were answered to the satisfaction of the patient. The patient's family participated and agreed to the plan of care as well as the use of the recommended implants for their surgery.    Plan for surgery today N.p.o. for the operating room Hold anticoagulation   Reinaldo Berber MD     Reinaldo Berber MD  Beeper #:  [phone]  09/09/2022 1:33 PM

## 2022-09-09 NOTE — Op Note (Signed)
Patient Name: Chad [name]  RUE:[id number]  Pre-Operative Diagnosis: Left hip Intertrochanteric fracture  Post-Operative Diagnosis: (same)  Procedure: Left Hip Intramedullary nail   Components/Implants: Nail:10x147mm x 130 deg TFNA  Lag Blade:100cc Locking Screw:34mmx5mm  Date of Surgery: 09/09/2022  Surgeon: Reinaldo Berber MD  Assistant: None   Anesthesiologist: Karlton Lemon  Anesthesia: General   EBL: 100cc  IVF: 600cc  Complications: None   Brief history: The patient is a 89 year old male who presented to the [facility] emergency room after a fall and found to have a  left hip intertrochanteric fracture.  The patient was admitted by the medical team and optimized for surgery.  A thorough discussion was had with the patient and family about the risks and benefits of surgical intervention for their hip fracture as definitive treatment.  The patient and family opted to proceed with the operation.  All preoperative films were reviewed and an appropriate surgical plan was made prior to surgery.   Description of procedure: The patient was brought to the operating room where laterality was confirmed by all those present to be the left side.  The patient was administered anesthesia on a stretcher prior to being moved supine on the operating room table. Patient was given an intravenous dose of antibiotics for surgical prophylaxis and TXA.  All bony prominences and extremities were well padded and the patient was securely attached to the table boots, a perineal post was placed and the patient had a safety strap placed.  Surgical site was prepped with alcohol and chlorhexidine. The surgical site over the hip was and draped in typical sterile fashion with multiple layers of adhesive and nonadhesive drapes.  The incision site was marked out with a sterile marker under fluoroscopic guidance.    A surgical timeout was then called with participation of all staff in the room the  patient was then a confirmed again and laterality confirmed.  An incision was made just proximal to the greater trochanter through the skin subcutaneous tissues and an incision was made in the glut max fascia.  A guidewire was placed through the greater trochanter at the tip under x-ray guidance into the intertrochanteric region.  The position of this wire was assessed on AP and lateral fluoroscopic images to ensure position.  An opening reamer was used to create access at the tip of the greater trochanter under fluoroscopic guidance.   A size 10 nail was advanced through the reamed hole in the proximal femur and seated within the femur to an appropriate depth under fluoroscopic guidance.  The aiming jig was used to mark an incision site for a lag screw to the femoral head which was then incised with a scalpel.  A wire was then advanced through the lateral cortex of the femur into the center of the femoral head on both AP and lateral fluoroscopic imaging stopping at the subchondral bone without penetration of the femoral head.  This wire was then used to measure for a lag screw and a size lag blade was inserted into the femoral head through the lateral cortex of the femur and the nail under fluoroscopic guidance.  The setscrew was then engaged with the lag blade with a small amount of play left so as to allow compression of the fracture.   The lag screw driver was removed and the aiming jig was switched for the distal interlocking screw .  A drill was used to drill a hole for the distal interlocking screw under fluoroscopic guidance and  a size 38mm screw was placed.  The intramedullary nail was assessed on AP and lateral fluoroscopic guidance prior to removal of the aiming jig.  Final fluoroscopic x-rays were then taken after removal of the jig.  The nail was found to be in appropriate position on AP and lateral imaging with appropriate lengths of both the lag screw in the distal locking screw.  The  fascia was closed with 0 Vicryl interrupted figure-of-eight sutures.  The subcutaneous tissues were closed with 2-0 Vicryl and the skin closed with 3-0 Monocryl and Dermabond.  Sterile dressings were applied to the incisions.   The patient was awoken from anesthesia transferred off of the operating room table onto a hospital bed.  The patient had a good pulse postoperatively in the foot . the patient was then transferred to the PACU in stable condition.

## 2022-09-09 NOTE — Anesthesia Procedure Notes (Signed)
Procedure Name: Intubation Date/Time: 09/09/2022 2:05 PM  Performed by: Henrietta Hoover, CRNAPre-anesthesia Checklist: Patient identified, Emergency Drugs available, Suction available and Patient being monitored Patient Re-evaluated:Patient Re-evaluated prior to induction Oxygen Delivery Method: Circle system utilized Preoxygenation: Pre-oxygenation with 100% oxygen Induction Type: IV induction Ventilation: Mask ventilation without difficulty Laryngoscope Size: McGraph and 4 Grade View: Grade II Tube type: Oral Tube size: 7.0 mm Number of attempts: 1 Airway Equipment and Method: Stylet and Video-laryngoscopy Placement Confirmation: ETT inserted through vocal cords under direct vision, positive ETCO2 and breath sounds checked- equal and bilateral Secured at: 21 cm Tube secured with: Tape Dental Injury: Teeth and Oropharynx as per pre-operative assessment and Injury to lip

## 2022-09-09 NOTE — Assessment & Plan Note (Signed)
+   recent fall w/ Comminuted and displaced intertrochanteric fracture on imaging Pain control  Per Dr. Roxan Hockey, case discussed w/ on call ortho who will plan to evaluate for possible operative intervention  Pain control  F/u ortho recs.

## 2022-09-09 NOTE — Consult Note (Addendum)
ORTHOPAEDIC CONSULTATION  REQUESTING PHYSICIAN: Floydene Flock, MD  Chief Complaint:   Left intertrochanteric femur fracture  History of Present Illness: Chad Baerwald Mccreadie Sr. is a 89 y.o. male with a past medical history of dementia who presented to the emergency room today from [facility].  The patient is with his son and wife who reported he had a fall about 3 days ago.  The patient is unable to provide much additional history due to his dementia.  They report they did not witness any fall but they believe he had one and he had been minimally ambulatory around the house since that time requiring wheelchair to get around and assistance with transfers.  The patient is not a great historian about pain and location of issues but does confirm that the pain he has is on the left side.  Per family they deny any recent illnesses or any acute changes in mental status. Patient has a history of stroke with chronic left upper extremity weakness.   Past Medical History:  Diagnosis Date   Coronary artery disease    Depression    Diabetes mellitus without complication (HCC)    Seizures (HCC)    Stroke (HCC)    Vascular dementia (HCC)    Past Surgical History:  Procedure Laterality Date   CARDIAC SURGERY     Social History   Socioeconomic History   Marital status: Married    Spouse name: Not on file   Number of children: Not on file   Years of education: Not on file   Highest education level: Not on file  Occupational History   Not on file  Tobacco Use   Smoking status: Former   Smokeless tobacco: Never  Substance and Sexual Activity   Alcohol use: No   Drug use: Never   Sexual activity: Not on file  Other Topics Concern   Not on file  Social History Narrative   Not on file   Social Determinants of Health   Financial Resource Strain: Not on file  Food Insecurity: Not on file  Transportation Needs: Not on file   Physical Activity: Not on file  Stress: Not on file  Social Connections: Not on file   History reviewed. No pertinent family history. No Known Allergies Prior to Admission medications   Medication Sig Start Date End Date Taking? Authorizing Provider  aspirin 81 MG chewable tablet Chew 81 mg by mouth daily.   Yes [provider]  atorvastatin (LIPITOR) 40 MG tablet Take 40 mg by mouth daily.   Yes [provider]  cholecalciferol (VITAMIN D3) 25 MCG (1000 UNIT) tablet Take 2,000 Units by mouth daily.   Yes [provider]  donepezil (ARICEPT) 10 MG tablet Take 10 mg by mouth at bedtime.   Yes [provider]  lamoTRIgine (LAMICTAL) 200 MG tablet Take 200 mg by mouth daily.   Yes [provider]  midodrine (PROAMATINE) 10 MG tablet Take 10 mg by mouth 3 (three) times daily.   Yes [provider]  QUEtiapine (SEROQUEL) 25 MG tablet Take 25 mg by mouth 2 (two) times daily. 05/28/21  Yes [provider]  sertraline (ZOLOFT) 100 MG tablet Take 100 mg by mouth daily. 05/22/21  Yes [provider]  traMADol (ULTRAM) 50 MG tablet Take 50 mg by mouth every 8 (eight) hours as needed for moderate pain. 09/08/22  Yes [provider]  finasteride (PROSCAR) 5 MG tablet Take 5 mg by mouth daily.  [provider]  isosorbide mononitrate (IMDUR) 30 MG 24 hr tablet Take 30 mg by mouth daily.    [provider]   DG Chest Portable 1 View  Result Date: 09/09/2022 CLINICAL DATA:  Preoperative respiratory evaluation. EXAM: PORTABLE CHEST 1 VIEW COMPARISON:  None Available. FINDINGS: The lungs are clear without focal pneumonia, edema, pneumothorax or pleural effusion. Interstitial markings are diffusely coarsened with chronic features. The cardiopericardial silhouette is within normal limits for size. Status post CABG. Bones are diffusely demineralized.The heart size and mediastinal contours are within normal limits. Both  lungs are clear. The visualized skeletal structures are unremarkable. IMPRESSION: No active disease. Electronically Signed   By: Kennith Center M.D.   On: 09/09/2022 12:32   DG FEMUR MIN 2 VIEWS LEFT  Result Date: 09/09/2022 CLINICAL DATA:  Fall with injury to left hip area. EXAM: LEFT FEMUR 2 VIEWS COMPARISON:  CT abdomen/pelvis 08/31/2022. FINDINGS: There is a comminuted and mildly proximally displaced intertrochanteric fracture. There is approximately 1.3 cm medial displacement of the lesser trochanter. Femoroacetabular alignment is maintained. The symphysis pubis is maintained. Extensive vascular calcifications are noted. IMPRESSION: Comminuted and displaced intertrochanteric fracture as above. Electronically Signed   By: Lesia Hausen M.D.   On: 09/09/2022 12:30   CT HEAD WO CONTRAST ( )  Result Date: 09/09/2022 CLINICAL DATA:  Fall yesterday. EXAM: CT HEAD WITHOUT CONTRAST TECHNIQUE: Contiguous axial images were obtained from the base of the skull through the vertex without intravenous contrast. RADIATION DOSE REDUCTION: This exam was performed according to the departmental dose-optimization program which includes automated exposure control, adjustment of the mA and/or kV according to patient size and/or use of iterative reconstruction technique. COMPARISON:  CT head 08/31/2022. FINDINGS: Brain: There is no acute intracranial hemorrhage, extra-axial fluid collection, or acute infarct. Remote infarcts in the right MCA distribution with ex vacuo dilatation of the right lateral ventricle are unchanged. The ventricles are stable in size. Gray-white differentiation is otherwise preserved. Additional hypodensity in the supratentorial white matter consistent with underlying chronic small-vessel ischemic change is stable. The pituitary and suprasellar region are normal. There is no mass lesion. There is no mass effect or midline shift. Vascular: There is calcification of the bilateral carotid siphons and  vertebral arteries. Skull: Normal. Negative for fracture or focal lesion. Sinuses/Orbits: The imaged paranasal sinuses are clear. The mastoid air cells are clear. Bilateral lens implants are in place. The globes and orbits are otherwise unremarkable. Other: None. IMPRESSION: Stable noncontrast head CT with no acute intracranial pathology. Electronically Signed   By: Lesia Hausen M.D.   On: 09/09/2022 12:04    Positive ROS: All other systems have been reviewed and were otherwise negative with the exception of those mentioned in the HPI and as above.  Physical Exam: General:  Alert, no acute distress Psychiatric:  Patient is alert and oriented to name and that he is at a hospital Cardiovascular:  No pedal edema Respiratory:  No wheezing, non-labored breathing GI:  Abdomen is soft and non-tender Skin:  No lesions in the area of chief complaint Neurologic:  Sensation intact distally Lymphatic:  No axillary or cervical lymphadenopathy  Orthopedic Exam:  Left lower extremity Skin intact with swelling and aging ecchymosis over the proximal left thigh and knee No tenderness to palpation around the knee, tender to palpation over the hip No tenderness palpation of the foot ankle or toes Pain with any motion of the left leg Foot warm and well-perfused Sensation intact + dorsalis pedis pulse  Secondary  survey No tenderness to palpation over other bony prominences in the lower extremities or bilateral upper extremities No pain with logroll or simulated axial loading of the right lower extremity All compartments soft No tenderness to palpation over the cervical or thoracic spine, no bony step-off Motor grossly intact throughout, no focal deficits except for no left upper extremity motor function at the patients baseline Sensation grossly intact throughout, no focal deficits Good distal pulses and capillary refill on all extremities   X-rays:  X-ray of the left hip and femur report above images  reviewed myself which show a comminuted intertrochanteric left femur fracture.  Agree with radiology interpretation.  No other fractures noted in the femur or knee.    Assessment: Left intertrochanteric femur fracture  Plan: [name] is a 89 year old male who presents with a left hip intertrochanteric femur fracture.  A long discussion took place about treatment options and alternatives to treatment with the patient's son and wife.  They have decided on surgical treatment for the hip fracture and the patient will need a left hip intramedullary nail.  A long discussion took place with the patient describing what a intramedullary nail is and what the procedure would entail. The xrays were reviewed with the patient and the implants were discussed. The ability to secure the implant utilizing screws/cement/ or cementless (press fit) fixation was discussed. Surgical exposures were discussed with the patient's son.    The hospitalization and post-operative care and rehabilitation were also discussed. The use of perioperative antibiotics and DVT prophylaxis were discussed. The risk, benefits and alternatives to a surgical intervention were discussed at length with the patient. The patient's son was also advised of risks related to the medical comorbidities. A lengthy discussion took place to review the most common complications including but not limited to: deep vein thrombosis, pulmonary embolus, heart attack, stroke, infection, wound breakdown, dislocation, numbness, leg length in-equality, damage to nerves, intraoperative fracture, malunion/ nonunion, tendon,muscles, arteries or other blood vessels, death and other possible complications from anesthesia. The patient's family was told that we will take steps to minimize these risks by using sterile technique, antibiotics and DVT prophylaxis when appropriate and follow the patient postoperatively in the office setting to monitor progress. The possibility of recurrent  pain, no improvement in pain and actual worsening of pain were also discussed with the patient and family.      The benefits of surgery were discussed with the patient including the potential for improving the patient's current clinical condition through operative intervention. Alternatives to surgical intervention including conservative management were also discussed in detail. All questions were answered to the satisfaction of the patient. The patient's family participated and agreed to the plan of care as well as the use of the recommended implants for their surgery.    Plan for surgery today N.p.o. for the operating room Hold anticoagulation   Reinaldo Berber MD     Reinaldo Berber MD  Beeper #:  [phone]  09/09/2022 1:33 PM

## 2022-09-09 NOTE — Anesthesia Preprocedure Evaluation (Signed)
Anesthesia Evaluation  Patient identified by MRN, date of birth, ID band Patient awake    Reviewed: Allergy & Precautions, H&P , NPO status , Patient's Chart, lab work & pertinent test results, reviewed documented beta blocker date and time   History of Anesthesia Complications Negative for: history of anesthetic complications  Airway Mallampati: III  TM Distance: >3 FB Neck ROM: full    Dental  (+) Dental Advidsory Given, Poor Dentition   Pulmonary neg pulmonary ROS, former smoker   Pulmonary exam normal breath sounds clear to auscultation       Cardiovascular Exercise Tolerance: Good (-) hypertension(-) angina + CAD and + CABG  (-) Past MI and (-) Cardiac Stents Normal cardiovascular exam(-) dysrhythmias (-) Valvular Problems/Murmurs Rhythm:regular Rate:Normal     Neuro/Psych Seizures - (in the past), Well Controlled,  PSYCHIATRIC DISORDERS     Dementia CVA (left arm), Residual Symptoms    GI/Hepatic negative GI ROS, Neg liver ROS,,,  Endo/Other  diabetes    Renal/GU negative Renal ROS  negative genitourinary   Musculoskeletal   Abdominal   Peds  Hematology negative hematology ROS (+)   Anesthesia Other Findings Past Medical History: No date: Coronary artery disease No date: Depression No date: Diabetes mellitus without complication (HCC) No date: Seizures (HCC) No date: Stroke (HCC) No date: Vascular dementia (HCC)   Reproductive/Obstetrics negative OB ROS                             Anesthesia Physical Anesthesia Plan  ASA: 3  Anesthesia Plan: General   Post-op Pain Management:    Induction: Intravenous  PONV Risk Score and Plan: 3 and Ondansetron, Dexamethasone and Treatment may vary due to age or medical condition  Airway Management Planned: LMA and Oral ETT  Additional Equipment:   Intra-op Plan:   Post-operative Plan: Extubation in OR  Informed Consent: I have  reviewed the patients History and Physical, chart, labs and discussed the procedure including the risks, benefits and alternatives for the proposed anesthesia with the patient or authorized representative who has indicated his/her understanding and acceptance.     Dental Advisory Given  Plan Discussed with: Anesthesiologist, CRNA and Surgeon  Anesthesia Plan Comments:        Anesthesia Quick Evaluation

## 2022-09-09 NOTE — Assessment & Plan Note (Signed)
Cont proscar  

## 2022-09-09 NOTE — Assessment & Plan Note (Signed)
No active CP  Cont home regimen   

## 2022-09-09 NOTE — ED Provider Notes (Signed)
[facility] Provider Note    Event Date/Time   First MD Initiated Contact with Patient 09/09/22 1030     (approximate)   History   No chief complaint on file. Level V Caveat:  dementia  HPI  Chad J Lieske Sr. is a 89 y.o. male with a history of dementia presents to the ER from [facility].  Reportedly had a fall 3 days ago.  Patient unable to provide much additional history.  Brought to the ER today due to swelling bruising worsening pain.  Has not been ambulatory since the fall.  Patient unable to provide much additional history.     Physical Exam   Triage Vital Signs: ED Triage Vitals  Enc Vitals Group     BP 09/09/22 1026 137/64     Pulse Rate 09/09/22 1026 68     Resp 09/09/22 1026 17     Temp 09/09/22 1026 98.9 F (37.2 C)     Temp Source 09/09/22 1026 Oral     SpO2 09/09/22 1026 92 %     Weight 09/09/22 1027 123 lb 7.3 oz (56 kg)     Height 09/09/22 1027 5\' 9"  (1.753 m)     Head Circumference --      Peak Flow --      Pain Score 09/09/22 1027 8     Pain Loc --      Pain Edu? --      Excl. in GC? --     Most recent vital signs: Vitals:   09/09/22 1026  BP: 137/64  Pulse: 68  Resp: 17  Temp: 98.9 F (37.2 C)  SpO2: 92%     Constitutional: Alert  Eyes: Conjunctivae are normal.  Head: Atraumatic. Nose: No congestion/rhinnorhea. Mouth/Throat: Mucous membranes are moist.   Neck: Painless ROM.  Cardiovascular:   Good peripheral circulation. Respiratory: Normal respiratory effort.  No retractions.  Gastrointestinal: Soft and nontender.  Musculoskeletal: Left leg is externally rotated and shortened with moderate amount of ecchymosis and swelling of the thigh.  Compartment is soft.  Neurovascular intact distally. Neurologic:  MAE spontaneously. No gross focal neurologic deficits are appreciated.  Skin:  Skin is warm, dry and intact. No rash noted. Psychiatric: Mood and affect are normal. Speech and behavior are normal.    ED  Results / Procedures / Treatments   Labs (all labs ordered are listed, but only abnormal results are displayed) Labs Reviewed  CBC WITH DIFFERENTIAL/PLATELET - Abnormal; Notable for the following components:      Result Value   RBC 3.45 (*)    Hemoglobin 10.3 (*)    HCT 32.6 (*)    All other components within normal limits  COMPREHENSIVE METABOLIC PANEL - Abnormal; Notable for the following components:   Glucose, Bld 106 (*)    Calcium 8.4 (*)    Total Protein 5.6 (*)    Albumin 2.5 (*)    All other components within normal limits     EKG  ED ECG REPORT I, Willy Eddy, the attending physician, personally viewed and interpreted this ECG.   Date: 09/09/2022  EKG Time: 12:08  Rate: 60  Rhythm: sinus  Axis: normal  Intervals: normal  ST&T Change: no stemi, no depressions    RADIOLOGY Please see ED Course for my review and interpretation.  I personally reviewed all radiographic images ordered to evaluate for the above acute complaints and reviewed radiology reports and findings.  These findings were personally discussed with the patient.  Please see medical record for radiology report.    PROCEDURES:  Critical Care performed: No  Procedures   MEDICATIONS ORDERED IN ED: Medications  fentaNYL (SUBLIMAZE) injection 50 mcg (50 mcg Intravenous Given 09/09/22 1104)     IMPRESSION / MDM / ASSESSMENT AND PLAN / ED COURSE  I reviewed the triage vital signs and the nursing notes.                              Differential diagnosis includes, but is not limited to, Dr., Contusion, dislocation, electrolyte abnormality, SDH, IPH  Patient presenting to the ER for evaluation of symptoms as described above.  Based on symptoms, risk factors and considered above differential, this presenting complaint could reflect a potentially life-threatening illness therefore the patient will be placed on continuous pulse oximetry and telemetry for monitoring.  Laboratory evaluation will  be sent to evaluate for the above complaints.  SPECT of left hip fracture based on exam.  Will order x-rays.  Will order IV fentanyl for pain    Clinical Course as of 09/09/22 1235  Thu Sep 09, 2022  1134 CT head on my review and interpretation without evidence of SDH or IPH. [PR]  1153 XR on my review and interpretation shows evidence of left intertrochanteric fracture [PR]  1225 For a prevention of orthopedics consulted regarding left intertrochanteric fracture.  Will consult hospitalist for admission. [PR]    Clinical Course User Index [PR] Willy Eddy, MD     FINAL CLINICAL IMPRESSION(S) / ED DIAGNOSES   Final diagnoses:  Closed displaced intertrochanteric fracture of left femur, initial encounter [facility])     Rx / DC Orders   ED Discharge Orders     None        Note:  This document was prepared using Dragon voice recognition software and may include unintentional dictation errors.Willy Eddy, MD 09/09/22 1235

## 2022-09-09 NOTE — Assessment & Plan Note (Addendum)
SSI as needed

## 2022-09-10 ENCOUNTER — Encounter: Payer: Self-pay | Admitting: Orthopedic Surgery

## 2022-09-10 DIAGNOSIS — E119 Type 2 diabetes mellitus without complications: Secondary | ICD-10-CM

## 2022-09-10 DIAGNOSIS — Z9861 Coronary angioplasty status: Secondary | ICD-10-CM

## 2022-09-10 DIAGNOSIS — S72142A Displaced intertrochanteric fracture of left femur, initial encounter for closed fracture: Principal | ICD-10-CM

## 2022-09-10 DIAGNOSIS — E440 Moderate protein-calorie malnutrition: Secondary | ICD-10-CM | POA: Insufficient documentation

## 2022-09-10 DIAGNOSIS — F01C0 Vascular dementia, severe, without behavioral disturbance, psychotic disturbance, mood disturbance, and anxiety: Secondary | ICD-10-CM

## 2022-09-10 DIAGNOSIS — N400 Enlarged prostate without lower urinary tract symptoms: Secondary | ICD-10-CM

## 2022-09-10 LAB — COMPREHENSIVE METABOLIC PANEL
ALT: 20 U/L (ref 0–44)
AST: 25 U/L (ref 15–41)
Albumin: 2.5 g/dL — ABNORMAL LOW (ref 3.5–5.0)
Alkaline Phosphatase: 68 U/L (ref 38–126)
Anion gap: 5 (ref 5–15)
BUN: 26 mg/dL — ABNORMAL HIGH (ref 8–23)
CO2: 27 mmol/L (ref 22–32)
Calcium: 7.9 mg/dL — ABNORMAL LOW (ref 8.9–10.3)
Chloride: 104 mmol/L (ref 98–111)
Creatinine, Ser: 0.98 mg/dL (ref 0.61–1.24)
GFR, Estimated: >60 mL/min (ref >60)
Glucose, Bld: 148 mg/dL — ABNORMAL HIGH (ref 70–99)
Potassium: 4.1 mmol/L (ref 3.5–5.1)
Sodium: 136 mmol/L (ref 135–145)
Total Bilirubin: 0.6 mg/dL (ref 0.3–1.2)
Total Protein: 5.1 g/dL — ABNORMAL LOW (ref 6.5–8.1)

## 2022-09-10 LAB — CBC
HCT: 27.7 % — ABNORMAL LOW (ref 39.0–52.0)
Hemoglobin: 8.9 g/dL — ABNORMAL LOW (ref 13.0–17.0)
MCH: 30 pg (ref 26.0–34.0)
MCHC: 32.1 g/dL (ref 30.0–36.0)
MCV: 93.3 fL (ref 80.0–100.0)
Platelets: 316 10*3/uL (ref 150–400)
RBC: 2.97 MIL/uL — ABNORMAL LOW (ref 4.22–5.81)
RDW: 13.7 % (ref 11.5–15.5)
WBC: 10.9 10*3/uL — ABNORMAL HIGH (ref 4.0–10.5)
nRBC: 0 % (ref 0.0–0.2)

## 2022-09-10 LAB — GLUCOSE, CAPILLARY: Glucose-Capillary: 109 mg/dL — ABNORMAL HIGH (ref 70–99)

## 2022-09-10 MED ORDER — ADULT MULTIVITAMIN W/MINERALS CH
1.0000 | ORAL_TABLET | Freq: Every day | ORAL | Status: DC
  Administered 2022-09-10 – 2022-09-14 (×5): 1 via ORAL
  Filled 2022-09-10 (×5): qty 1

## 2022-09-10 MED ORDER — ENSURE ENLIVE PO LIQD
237.0000 mL | Freq: Two times a day (BID) | ORAL | Status: DC
  Administered 2022-09-10 – 2022-09-14 (×8): 237 mL via ORAL

## 2022-09-10 MED ORDER — ASPIRIN 81 MG PO CHEW
81.0000 mg | CHEWABLE_TABLET | Freq: Every day | ORAL | Status: DC
  Administered 2022-09-10 – 2022-09-14 (×5): 81 mg via ORAL
  Filled 2022-09-10 (×5): qty 1

## 2022-09-10 MED ORDER — FINASTERIDE 5 MG PO TABS
5.0000 mg | ORAL_TABLET | Freq: Every day | ORAL | Status: DC
  Administered 2022-09-10 – 2022-09-14 (×5): 5 mg via ORAL
  Filled 2022-09-10 (×5): qty 1

## 2022-09-10 MED ORDER — ORAL CARE MOUTH RINSE: 15.0000 mL | OROMUCOSAL | Status: DC | PRN

## 2022-09-10 MED ORDER — INSULIN ASPART 100 UNIT/ML IJ SOLN
0.0000 [IU] | INTRAMUSCULAR | Status: DC
  Administered 2022-09-11: 1 [IU] via SUBCUTANEOUS
  Administered 2022-09-13 (×2): 2 [IU] via SUBCUTANEOUS
  Administered 2022-09-14 (×2): 1 [IU] via SUBCUTANEOUS
  Filled 2022-09-10 (×5): qty 1

## 2022-09-10 NOTE — Evaluation (Addendum)
Physical Therapy Evaluation Patient Details Name: Chad FELS Sr. MRN: [id number] DOB: 18-Mar-1934 Today's Date: 09/10/2022  History of Present Illness  Chad [name] is an 46yoM who comes to [facility] after a fall at facility, subsequent pain and difficulty tolerating baseline mobility. Imaging revealing of Lt hip fracture, pt now s/p IM nail fixation anfd WBAT. PMH: vascular dementia, CAD, DM2, chronic orthostatic hypotension on midodrine, frequent falls, remote CVA c LUE hemiplegia. Per son, pt has been mostly WC transfers over past few months, only minimal walking, most of which has been with PT at [facility] in Feb. Pt recently transitioned from home with wife to ALF at [facility] about 1-2 weeks prior to this admission. Son reports pt has had a hard time avoiding deconditioning at home this year.  Clinical Impression  Pt in bed on entry, appears comfortable. Author orients pt to place and situation. Pt familiar to author from 2022 OPPT, but familiarity is not reciprocal given underlying vascular dementia. Pt agreeable to session. Tolerates some assisted ROM of limb in bed, but once at EOB, pain is intolerable with impulsive weight shifting off of hip for pain control, putting pt into less safe posturing. Pt becomes somewhat hypotonic of posture at EOB, no LOC, but difficult to attribute to pain avoidance or presyncope given his prior history of asymptomatic orthostatic hypotension. Unsafe to spend more time at EOB, unsafe to attempt transfer. Pt assisted back into bed, sitting tall, appears to tolerate well. Son and orthopedist in room at [facility]. Will continue to follow.      Recommendations for follow up therapy are one component of a multi-disciplinary discharge planning process, led by the attending physician.  Recommendations may be updated based on patient status, additional functional criteria and insurance authorization.  Follow Up Recommendations Can patient physically be transported by  private vehicle: No     Assistance Recommended at Discharge Intermittent Supervision/Assistance  Patient can return home with the following  Two people to help with walking and/or transfers;Two people to help with bathing/dressing/bathroom;Direct supervision/assist for financial management;Direct supervision/assist for medications management;Help with stairs or ramp for entrance    Equipment Recommendations None recommended by PT  Recommendations for Other Services       Functional Status Assessment Patient has had a recent decline in their functional status and demonstrates the ability to make significant improvements in function in a reasonable and predictable amount of time.     Precautions / Restrictions Precautions Precautions: Fall Precaution Comments: chronic orthostatic hypotnesion without symptoms due to dementia, takes midodrine at baseline Restrictions Weight Bearing Restrictions: Yes LLE Weight Bearing: Weight bearing as tolerated      Mobility  Bed Mobility Overal bed mobility: Needs Assistance Bed Mobility: Supine to Sit, Sit to Supine     Supine to sit: Max assist Sit to supine: Max assist        Transfers Overall transfer level:  (unable to safely attempt, unable to toelrate sitting EOB)                      Ambulation/Gait                  Stairs            Wheelchair Mobility    Modified Rankin (Stroke Patients Only)       Balance  Pertinent Vitals/Pain Pain Assessment Pain Assessment: Faces Faces Pain Scale: Hurts even more Pain Location: Left hip while seated    Home Living Family/patient expects to be discharged to:: Assisted living                 Home Equipment:  (uses a WC at facility for [facility] mobility needs) Additional Comments: very limited upright/standing/AMB tolerance in years past due to hypotension issues.    Prior Function  Prior Level of Function : Needs assist  Cognitive Assist : ADLs (cognitive);Mobility (cognitive)     Physical Assist : ADLs (physical);Mobility (physical)     Mobility Comments: mostly WC level mobility; weakness and debility have progressed. Long history of limited upright tolerance due to orthostatic hypotension.       Hand Dominance        Extremity/Trunk Assessment                Communication      Cognition Arousal/Alertness: Awake/alert Behavior During Therapy: WFL for tasks assessed/performed Overall Cognitive Status: Within Functional Limits for tasks assessed                                          General Comments      Exercises General Exercises - Lower Extremity Heel Slides: AAROM, Left, Supine, 5 reps Hip ABduction/ADduction: AAROM, Left, 5 reps, Supine   Assessment/Plan    PT Assessment Patient needs continued PT services  PT Problem List Decreased strength;Decreased activity tolerance;Decreased balance;Decreased mobility;Decreased cognition       PT Treatment Interventions Stair training;Functional mobility training;Therapeutic activities;Therapeutic exercise;DME instruction;Neuromuscular re-education;Patient/family education;Wheelchair mobility training    PT Goals (Current goals can be found in the Care Plan section)  Acute Rehab PT Goals Patient Stated Goal: be able to transition back to [facility] PT Goal Formulation: With family Time For Goal Achievement: 09/24/22 Potential to Achieve Goals: Fair    Frequency 7X/week     Co-evaluation               AM-PAC PT "6 Clicks" Mobility  Outcome Measure Help needed turning from your back to your side while in a flat bed without using bedrails?: Total Help needed moving from lying on your back to sitting on the side of a flat bed without using bedrails?: Total Help needed moving to and from a bed to a chair (including a wheelchair)?: Total Help needed standing up  from a chair using your arms (e.g., wheelchair or bedside chair)?: Total Help needed to walk in hospital room?: Total Help needed climbing 3-5 steps with a railing? : Total 6 Click Score: 6    End of Session   Activity Tolerance: Patient limited by pain Patient left: in bed;with call bell/phone within reach;with family/visitor present Nurse Communication: Mobility status PT Visit Diagnosis: History of falling (Z91.81);Muscle weakness (generalized) (M62.81)    Time: 1610-9604 PT Time Calculation (min) (ACUTE ONLY): 26 min   Charges:   PT Evaluation $PT Eval Moderate Complexity: 1 Mod        12:23 PM, 09/10/22 Rosamaria Lints, PT, DPT Physical Therapist - [facility]  [phone] (ASCOM)    Tamasha Laplante C 09/10/2022, 12:23 PM

## 2022-09-10 NOTE — Progress Notes (Signed)
Discussed with Dr Joseph Art regarding pt foley catheter, instructed to leave Foley overnight, just restarting his finasteride for BPH.will DC in the AM

## 2022-09-10 NOTE — Progress Notes (Signed)
Chad Massaquoi Goh Sr. WGN:[id number] DOB: Oct 30, 1933 DOA: 09/09/2022 PCP: Tessie Fass, MD   SubjElaina Pattee Krehbiel Sr. is a 89 y.o. male PMHx CAD, Vascular Dementia end-stage, Hx CVA, DM type II without complication, Hx seizures, BPH  Presenting with left hip fracture.  History primarily from patient's son in the setting of end-stage dementia.  Per report, patient had a fall at facility approximately 3 to 4 days ago.  No reported head trauma loss consciousness associated with fall.  Patient minimally ambulating at baseline.  Son who is an EMT evaluated patient and noted that his left leg was shortened and internally rotation with mild bruising.  Upon evaluating patient was unable to ambulate though was noted to have some degree of ambulation with movement with other family seeing him.  No fevers or chills.  No nausea or vomiting.  No hemiparesis.  Baseline generalized confusion. Presented to the ER afebrile, hemodynamically stable.  White count 8.8, hemoglobin 10.3, creatinine 0.8.  CT head within normal limits.  Chest x-ray stable.  Left hip plain films which showed an comminuted and displaced intertrochanteric fracture.  Per Dr. Roxan Hockey in the ER, case discussed with on-call orthopedic surgeon Dr. Audelia Acton who will evaluate the patient. Review of Systems: As mentioned in the history of present illness. All other systems reviewed and are negative.   Obj: A/O x 2 (does not know when, where).  Knows he has a fracture LEFT hip but cannot tell you how he obtained it.   Objective: VITAL SIGNS: Temp: 98.4 F (36.9 C) (05/10 0756) BP: 104/50 (05/10 0756) Pulse Rate: 68 (05/10 0756)   VENTILATOR SETTINGS: **  Procedures/Significant Events: 5/9 s/p INTRAMEDULLARY (IM) NAIL INTERTROCHANTERIC (Left)    Consultants:  Orthopedic surgery Dr. Reinaldo Berber   Cultures   Antimicrobials:   Intake/Output Summary (Last 24 hours) at 09/10/2022 1515 Last data filed at 09/10/2022 0641 Gross  per 24 hour  Intake 882.14 ml  Output 750 ml  Net 132.14 ml     Exam: Physical Exam:  General: A/O x 2 (does not know when, where), No acute respiratory distress Eyes: negative scleral hemorrhage, negative anisocoria, negative icterus ENT: Negative Runny nose, negative gingival bleeding, Neck:  Negative scars, masses, torticollis, lymphadenopathy, JVD Lungs: Clear to auscultation bilaterally without wheezes or crackles Cardiovascular: Regular rate and rhythm without murmur gallop or rub normal S1 and S2 Abdomen: negative abdominal pain, nondistended, positive soft, bowel sounds, no rebound, no ascites, no appreciable mass Extremities: LEFT hip appropriately tender s/p repair.  Honeycomb bandages in place over incision sites negative sign of infection or bleeding. Skin: Negative rashes, lesions, ulcers Psychiatric:  Negative depression, negative anxiety, negative fatigue, negative mania, could not fully assess secondary to dementia Central nervous system:  Cranial nerves II through XII intact, tongue/uvula midline, all extremities muscle strength 5/5, sensation intact throughout, negative dysarthria, negative expressive aphasia, negative receptive aphasia.   .   DVT prophylaxis: Lovenox Code Status: Full Family Communication:  Status is: Inpatient    Dispo: The patient is from: Home              Anticipated d/c is to:SNF?              Anticipated d/c date is:               Patient currently is not medically stable to d/c.      Assessment & Plan: Covid vaccination;   Principal Problem:   Hip fracture (HCC) Active Problems:  Diabetes mellitus type 2, uncomplicated (HCC)   Vascular dementia (HCC)   CAD S/P percutaneous coronary angioplasty   BPH (benign prostatic hyperplasia)   Closed displaced intertrochanteric fracture of left femur (HCC)   Malnutrition of moderate degree   Hip fracture (HCC) -Recent fall w/ Comminuted and displaced intertrochanteric fracture on  imaging -5/9 s/p INTRAMEDULLARY (IM) NAIL INTERTROCHANTERIC (Left)   -5/10 per orthopedic surgery  -Lovenox 40 mg subcu daily x 14 days at discharge  -Weightbearing as tolerated left lower extremity  -Patient can begin showering/bathing in 3 days with honeycomb dressing.   -Patient can remove honeycomb dressing 1 week postop and continue showering/bathing with no  Dressing    BPH (benign prostatic hyperplasia) -Finasteride 5 mg daily   CAD S/P percutaneous coronary angioplasty/Hypotension -Hold Imdur secondary to patient's relative hypotension - Midodrine 10 mg TID   Vascular dementia end-stage (HCC) -Baseline (at baseline not oriented to person, place, time) -Benazepril 10 mg daily - Lamictal 200 mg daily - Seroquel 25 mg BID -Zoloft 100 mg daily  Diabetes mellitus type 2, uncomplicated (HCC) -5/10 hemoglobin A1c pending -5/10 sensitive SSI  Malnutrition of moderate degree -  Hypocalcemia -Calcium goal> 8.9 -5/10 corrected calcium= 9.1 no action required       Mobility Assessment (last 72 hours)     Mobility Assessment     Row Name 09/10/22 1212 09/10/22 0900 09/09/22 2053 09/09/22 1645     Does patient have an order for bedrest or is patient medically unstable -- -- No - Continue assessment No - Continue assessment    What is the highest level of mobility based on the progressive mobility assessment? Level 1 (Bedfast) - Unable to balance while sitting on edge of bed Level 1 (Bedfast) - Unable to balance while sitting on edge of bed Level 1 (Bedfast) - Unable to balance while sitting on edge of bed --    Is the above level different from baseline mobility prior to current illness? -- Yes - Recommend PT order Yes - Recommend PT order --                  Time: 50 minutes         Care during the described time interval was provided by me .  I have reviewed this patient's available data, including medical history, events of note, physical examination,  and all test results as part of my evaluation.

## 2022-09-10 NOTE — TOC Initial Note (Addendum)
Transition of Care (TOC) - Initial/Assessment Note    Patient Details  Name: Chad Kuramoto Steinert Sr. MRN: [id number] Date of Birth: March 23, 1934  Transition of Care [facility]) CM/SW Contact:    Liliana Cline, LCSW Phone Number: 09/10/2022, 1:40 PM  Clinical Narrative:                 Spoke with son Damarious Goodgion. re SNF recommendation. Patient moved into [facility] 1 1/2 weeks ago. He is active with [facility] per [employer]. [place] is agreeable to SNF, patient went to [facility] and he stated he thinks that would be their preference for SNF. Andrick stated patient was there in Feb or March, he is not sure exactly how long patient was there or how long it has been. CSW left VM for Tiffany in Admissions at [facility] to inquire if they can make a bed offer and if patient has Medicare days left.  SNF workup started. PASRR pending.   4:55- Call from Tiffany at [facility]. She states they can make a bed offer. They stated patient discharged from there 3/1 so he does have Medicare days. The earliest they could take patient is Monday if medically ready.     Expected Discharge Plan: Skilled Nursing Facility Barriers to Discharge: Continued Medical Work up   Patient Goals and CMS Choice Patient states their goals for this hospitalization and ongoing recovery are:: SNF CMS Medicare.gov Compare Post Acute Care list provided to:: Patient Represenative (must comment) Choice offered to / list presented to : Adult Children      Expected Discharge Plan and Services       Living arrangements for the past 2 months: Assisted Living Facility                                      Prior Living Arrangements/Services Living arrangements for the past 2 months: Assisted Living Facility Lives with:: Facility Resident Patient language and need for interpreter reviewed:: Yes Do you feel safe going back to the place where you live?: Yes      Need for Family Participation  in Patient Care: Yes (Comment) Care giver support system in place?: Yes (comment) Current home services: DME, Home PT Criminal Activity/Legal Involvement Pertinent to Current Situation/Hospitalization: No - Comment as needed  Activities of Daily Living Home Assistive Devices/Equipment: Cane (specify quad or straight), None ADL Screening (condition at time of admission) Patient's cognitive ability adequate to safely complete daily activities?: No Is the patient deaf or have difficulty hearing?: No Does the patient have difficulty seeing, even when wearing glasses/contacts?: No Does the patient have difficulty concentrating, remembering, or making decisions?: Yes Patient able to express need for assistance with ADLs?: Yes (pt has dementiaa, pt wears depends according to son) Does the patient have difficulty dressing or bathing?: Yes Independently performs ADLs?: No Dressing (OT): Needs assistance Grooming: Needs assistance Feeding: Independent Bathing: Needs assistance Toileting: Needs assistance In/Out Bed: Needs assistance Does the patient have difficulty walking or climbing stairs?: Yes Weakness of Legs: Both Weakness of Arms/Hands: Left  Permission Sought/Granted Permission sought to share information with : Facility Industrial/product designer granted to share information with : Yes, Verbal Permission Granted     Permission granted to share info w AGENCY: SNFs        Emotional Assessment       Orientation: : Fluctuating Orientation (Suspected and/or reported  Sundowners) Alcohol / Substance Use: Not Applicable Psych Involvement: No (comment)  Admission diagnosis:  Closed displaced intertrochanteric fracture of left femur, initial encounter (HCC) [S72.142A] Hip fracture (HCC) [S72.009A] Patient Active Problem List   Diagnosis Date Noted   Hip fracture (HCC) 09/09/2022   BPH (benign prostatic hyperplasia) 09/09/2022   Closed displaced intertrochanteric fracture of  left femur (HCC) 09/09/2022   Aspiration pneumonia (HCC) 06/25/2021   CAP (community acquired pneumonia) 06/23/2021   Diabetes mellitus type 2, uncomplicated (HCC) 06/23/2021   Vascular dementia (HCC) 06/23/2021   Generalized weakness 06/23/2021   CAD S/P percutaneous coronary angioplasty 06/23/2021   PCP:  Tessie Fass, MD Pharmacy:   [facility] - [place], [place] - [address] [place] [address] Phone: [phone] Fax: [phone]     Social Determinants of Health (SDOH) Social History: SDOH Screenings   Food Insecurity: No Food Insecurity (09/09/2022)  Housing: Low Risk  (09/09/2022)  Transportation Needs: No Transportation Needs (09/09/2022)  Utilities: Not At Risk (09/09/2022)  Tobacco Use: Medium Risk (09/09/2022)   SDOH Interventions:     Readmission Risk Interventions     No data to display

## 2022-09-10 NOTE — Progress Notes (Addendum)
Initial Nutrition Assessment  DOCUMENTATION CODES:   Non-severe (moderate) malnutrition in context of chronic illness, Underweight  INTERVENTION:   -Ensure Enlive po BID, each supplement provides 350 kcal and 20 grams of protein -MVI with minerals daily -Continue regular diet  NUTRITION DIAGNOSIS:   Moderate Malnutrition related to chronic illness (dementia) as evidenced by mild fat depletion, moderate fat depletion, mild muscle depletion, moderate muscle depletion.  GOAL:   Patient will meet greater than or equal to 90% of their needs  MONITOR:   PO intake, Supplement acceptance  REASON FOR ASSESSMENT:   Malnutrition Screening Tool    ASSESSMENT:   Pt with medical history significant of coronary artery disease, vascular dementia, history of CVA, type 2 diabetes, history of seizures presenting with left hip fracture.  Pt admitted with lt hip fracture.   5/9- s/p Left Hip Intramedullary nail   Reviewed I/O's: +807 ml x 24 hours  UOP: 825 ml x 24 hours   Pt sitting up in bed at time of visit. Pt smiling and reports feeling better today. He reports he ate a good breakfast this morning ("all of it"). Noted meal completions 100%. Per pt, he has a good appetite and typically consumes 3 meals per day (pt reports living home with his wife, but lives in [place]). Pt denies any chewing or swallowing issues.   Reviewed wt hx; pt has experienced a 23.7% wt loss over the past 15 months. While this is not significant for time frame, it is concerning given advanced age and dementia.   Discussed importance of good meal and supplement intake to promote healing. Pt amenable to supplements.   Medications reviewed and include colace and 0.9% sodium chloride infusion @ 50 ml/hr.   Lab Results  Component Value Date   HGBA1C 6.6 (H) 06/23/2021   PTA DM medications are none.   Labs reviewed: CBGS: 115 (inpatient orders for glycemic control are none).    NUTRITION - FOCUSED PHYSICAL  EXAM:  Flowsheet Row Most Recent Value  Orbital Region Mild depletion  Upper Arm Region Moderate depletion  Thoracic and Lumbar Region Moderate depletion  Buccal Region Moderate depletion  Temple Region Mild depletion  Clavicle Bone Region Mild depletion  Clavicle and Acromion Bone Region Mild depletion  Scapular Bone Region Mild depletion  Dorsal Hand Moderate depletion  Patellar Region Moderate depletion  Anterior Thigh Region Moderate depletion  Posterior Calf Region Moderate depletion  Edema (RD Assessment) None  Hair Reviewed  Eyes Reviewed  Mouth Reviewed  Skin Reviewed  Nails Reviewed       Diet Order:   Diet Order             Diet regular Room service appropriate? Yes; Fluid consistency: Thin  Diet effective now                   EDUCATION NEEDS:   Education needs have been addressed  Skin:  Skin Assessment: Skin Integrity Issues: Skin Integrity Issues:: Incisions Incisions: closed lt hip  Last BM:  Unknown  Height:   Ht Readings from Last 1 Encounters:  09/09/22 5\' 9"  (1.753 m)    Weight:   Wt Readings from Last 1 Encounters:  09/09/22 56 kg    Ideal Body Weight:  72.7 kg  BMI:  Body mass index is 18.23 kg/m.  Estimated Nutritional Needs:   Kcal:  1700-1900  Protein:  85-100 grams  Fluid:  > 1.7 L    Levada Schilling, RD, LDN, CDCES Registered Dietitian II  Certified Diabetes Care and Education Specialist Please refer to [facility] for RD and/or RD on-call/weekend/after hours pager

## 2022-09-10 NOTE — Progress Notes (Addendum)
Subjective: 1 Day Post-Op Procedure(s) (LRB): INTRAMEDULLARY (IM) NAIL INTERTROCHANTERIC (Left) Patient reports pain as mild.   Patient is well, and has had no acute complaints or problems Denies any CP, SOB, ABD pain. We will start physical therapy today.  Plan is to go Skilled nursing facility after hospital stay.  Objective: Vital signs in last 24 hours: Temp:  [97.2 F (36.2 C)-98.9 F (37.2 C)] 98 F (36.7 C) (05/10 0328) Pulse Rate:  [65-82] 67 (05/10 0328) Resp:  [13-20] 16 (05/10 0328) BP: (95-137)/(49-78) 96/54 (05/10 0328) SpO2:  [92 %-100 %] 98 % (05/10 0328) Weight:  [56 kg] 56 kg (05/09 1027)  Intake/Output from previous day: 05/09 0701 - 05/10 0700 In: 1732.1 [P.O.:240; I.V.:1042.1; IV Piggyback:450] Out: 925 [Urine:825; Blood:100] Intake/Output this shift: No intake/output data recorded.  Recent Labs    09/09/22 1104 09/10/22 0435  HGB 10.3* 8.9*   Recent Labs    09/09/22 1104 09/10/22 0435  WBC 8.8 10.9*  RBC 3.45* 2.97*  HCT 32.6* 27.7*  PLT 350 316   Recent Labs    09/09/22 1104 09/10/22 0435  NA 137 136  K 4.2 4.1  CL 102 104  CO2 27 27  BUN 20 26*  CREATININE 0.82 0.98  GLUCOSE 106* 148*  CALCIUM 8.4* 7.9*   No results for input(s): "LABPT", "INR" in the last 72 hours.  EXAM General - Patient is Alert, Appropriate, and Confused Extremity - Neurovascular intact Sensation intact distally Intact pulses distally Dorsiflexion/Plantar flexion intact No cellulitis present Compartment soft Mild ecchymosis Dressing - dressing C/D/I and no drainage Motor Function - intact, moving foot and toes well on exam.   Past Medical History:  Diagnosis Date   Coronary artery disease    Depression    Diabetes mellitus without complication (HCC)    Seizures (HCC)    Stroke (HCC)    Vascular dementia (HCC)     Assessment/Plan:   1 Day Post-Op Procedure(s) (LRB): INTRAMEDULLARY (IM) NAIL INTERTROCHANTERIC (Left) Principal Problem:    Hip fracture (HCC) Active Problems:   Diabetes mellitus type 2, uncomplicated (HCC)   Vascular dementia (HCC)   CAD S/P percutaneous coronary angioplasty   BPH (benign prostatic hyperplasia)   Closed displaced intertrochanteric fracture of left femur (HCC)  Estimated body mass index is 18.23 kg/m as calculated from the following:   Height as of this encounter: 5\' 9"  (1.753 m).   Weight as of this encounter: 56 kg. Advance diet Up with therapy  Vital signs are stable, BP soft.  Patient asymptomatic.  Continue to monitor.  Pain well-controlled  Acute on chronic postop blood loss anemia -hemoglobin 8.9.  Recheck hemoglobin in the morning.  Care management to assist with discharge to skilled nursing facility.   Patient will need 2-week follow-up with [facility] orthopedics for staple removal and x-rays. Lovenox 40 mg subcu daily x 14 days at discharge Weightbearing as tolerated left lower extremity Patient can begin showering/bathing in 3 days with honeycomb dressing.  Patient can remove honeycomb dressing 1 week postop and continue showering/bathing with no dressing    DVT Prophylaxis - Lovenox, TED hose, and SCDs Weight-Bearing as tolerated to left leg   T. Cranston Neighbor, PA-C [facility] 09/10/2022, 7:42 AM   Patient seen and examined, agree with above plan.  The patient is doing well status post left hip intramedullary nanil, no concerns at this time. Some post op anemia will recheck tomorrow morning.  Pain is controlled.  Discussed DVT prophylaxis, pain medication use,  and safe transition to back to rehab with son.  All questions answered .   Reinaldo Berber MD

## 2022-09-10 NOTE — NC FL2 (Signed)
w [place] MEDICAID FL2 LEVEL OF CARE FORM     IDENTIFICATION  Patient Name: Chad NIEBEL Sr. Birthdate: 07-27-1933 Sex: male Admission Date (Current Location): 09/09/2022  [facility] and [place] Number:  Chiropodist and Address:  [facility], [address] [place], [place] [address]      Provider Number: [id number]  Attending Physician Name and Address:  Drema Dallas, MD  Relative Name and Phone Number:  Khaden, Kiddy [facility]) [phone] [facility])    Current Level of Care: Hospital Recommended Level of Care: Skilled Nursing Facility Prior Approval Number:    Date Approved/Denied:   PASRR Number: pending  Discharge Plan:      Current Diagnoses: Patient Active Problem List   Diagnosis Date Noted   Hip fracture (HCC) 09/09/2022   BPH (benign prostatic hyperplasia) 09/09/2022   Closed displaced intertrochanteric fracture of left femur (HCC) 09/09/2022   Aspiration pneumonia (HCC) 06/25/2021   CAP (community acquired pneumonia) 06/23/2021   Diabetes mellitus type 2, uncomplicated (HCC) 06/23/2021   Vascular dementia (HCC) 06/23/2021   Generalized weakness 06/23/2021   CAD S/P percutaneous coronary angioplasty 06/23/2021    Orientation RESPIRATION BLADDER Height & Weight     Self  Normal Indwelling catheter Weight: 123 lb 7.3 oz (56 kg) Height:  5\' 9"  (175.3 cm)  BEHAVIORAL SYMPTOMS/MOOD NEUROLOGICAL BOWEL NUTRITION STATUS        Diet (regular)  AMBULATORY STATUS COMMUNICATION OF NEEDS Skin     Verbally Surgical wounds, Bruising (L hip)                       Personal Care Assistance Level of Assistance  Bathing, Feeding, Dressing Bathing Assistance: Maximum assistance Feeding assistance: Maximum assistance Dressing Assistance: Maximum assistance     Functional Limitations Info             SPECIAL CARE FACTORS FREQUENCY  PT (By licensed PT), OT (By licensed OT)     PT Frequency: 5 times per week OT  Frequency: 5 times per week            Contractures      Additional Factors Info  Code Status, Allergies Code Status Info: full Allergies Info: nka           Current Medications (09/10/2022):  This is the current hospital active medication list Current Facility-Administered Medications  Medication Dose Route Frequency Provider Last Rate Last Admin   0.9 %  sodium chloride infusion   Intravenous Continuous Floydene Flock, MD 50 mL/hr at 09/10/22 0641 Infusion Verify at 09/10/22 0641   acetaminophen (TYLENOL) tablet 500 mg  500 mg Oral Q6H Reinaldo Berber, MD   500 mg at 09/10/22 0919   atorvastatin (LIPITOR) tablet 40 mg  40 mg Oral Daily Floydene Flock, MD   40 mg at 09/10/22 0920   docusate sodium (COLACE) capsule 100 mg  100 mg Oral BID Reinaldo Berber, MD   100 mg at 09/10/22 0919   donepezil (ARICEPT) tablet 10 mg  10 mg Oral QHS Floydene Flock, MD   10 mg at 09/09/22 2052   enoxaparin (LOVENOX) injection 40 mg  40 mg Subcutaneous Q24H Reinaldo Berber, MD   40 mg at 09/10/22 0919   feeding supplement (ENSURE ENLIVE / ENSURE PLUS) liquid 237 mL  237 mL Oral BID BM Drema Dallas, MD       HYDROcodone-acetaminophen (NORCO/VICODIN) 5-325 MG per tablet 1-2 tablet  1-2 tablet Oral Q4H  PRN Reinaldo Berber, MD       HYDROmorphone (DILAUDID) injection 0.5 mg  0.5 mg Intravenous Q4H PRN Floydene Flock, MD       lamoTRIgine (LAMICTAL) tablet 200 mg  200 mg Oral Daily Floydene Flock, MD   200 mg at 09/10/22 0919   menthol-cetylpyridinium (CEPACOL) lozenge 3 mg  1 lozenge Oral PRN Reinaldo Berber, MD       Or   phenol (CHLORASEPTIC) mouth spray 1 spray  1 spray Mouth/Throat PRN Reinaldo Berber, MD       metoCLOPramide (REGLAN) tablet 5-10 mg  5-10 mg Oral Q8H PRN Reinaldo Berber, MD       Or   metoCLOPramide (REGLAN) injection 5-10 mg  5-10 mg Intravenous Q8H PRN Reinaldo Berber, MD       midodrine (PROAMATINE) tablet 10 mg  10 mg Oral TID with meals Floydene Flock,  MD   10 mg at 09/10/22 1229   morphine (PF) 2 MG/ML injection 0.5-1 mg  0.5-1 mg Intravenous Q2H PRN Reinaldo Berber, MD       multivitamin with minerals tablet 1 tablet  1 tablet Oral Daily Drema Dallas, MD   1 tablet at 09/10/22 1230   ondansetron (ZOFRAN) tablet 4 mg  4 mg Oral Q6H PRN Reinaldo Berber, MD       Or   ondansetron (ZOFRAN) injection 4 mg  4 mg Intravenous Q6H PRN Reinaldo Berber, MD       Oral care mouth rinse  15 mL Mouth Rinse PRN Floydene Flock, MD       QUEtiapine (SEROQUEL) tablet 25 mg  25 mg Oral BID Floydene Flock, MD   25 mg at 09/10/22 0920   sertraline (ZOLOFT) tablet 100 mg  100 mg Oral Daily Floydene Flock, MD   100 mg at 09/10/22 0920   traMADol (ULTRAM) tablet 50 mg  50 mg Oral Q6H PRN Reinaldo Berber, MD         Discharge Medications: Please see discharge summary for a list of discharge medications.  Relevant Imaging Results:  Relevant Lab Results:   Additional Information SS #: [id number]  Chad [name] E Chad Lambert, LCSW

## 2022-09-10 NOTE — Plan of Care (Signed)
  Problem: Clinical Measurements: Goal: Ability to maintain clinical measurements within normal limits will improve Outcome: Progressing Goal: Will remain free from infection Outcome: Progressing Goal: Diagnostic test results will improve Outcome: Progressing Goal: Respiratory complications will improve Outcome: Progressing Goal: Cardiovascular complication will be avoided Outcome: Progressing   Problem: Activity: Goal: Risk for activity intolerance will decrease Outcome: Progressing   Problem: Nutrition: Goal: Adequate nutrition will be maintained Outcome: Progressing   Problem: Coping: Goal: Level of anxiety will decrease Outcome: Progressing   Problem: Elimination: Goal: Will not experience complications related to bowel motility Outcome: Progressing Goal: Will not experience complications related to urinary retention Outcome: Progressing   Problem: Pain Managment: Goal: General experience of comfort will improve Outcome: Progressing   Problem: Safety: Goal: Ability to remain free from injury will improve Outcome: Progressing   Problem: Skin Integrity: Goal: Risk for impaired skin integrity will decrease Outcome: Progressing   Problem: Activity: Goal: Ability to ambulate and perform ADLs will improve Outcome: Progressing   Problem: Clinical Measurements: Goal: Postoperative complications will be avoided or minimized Outcome: Progressing   Problem: Self-Concept: Goal: Ability to maintain and perform role responsibilities to the fullest extent possible will improve Outcome: Progressing   Problem: Pain Management: Goal: Pain level will decrease Outcome: Progressing   Problem: Education: Goal: Knowledge of General Education information will improve Description: Including pain rating scale, medication(s)/side effects and non-pharmacologic comfort measures Outcome: Not Progressing Note: Patient experiencing confusion at this time. Will continue to educate  and reorient as needed.   Problem: Health Behavior/Discharge Planning: Goal: Ability to manage health-related needs will improve Outcome: Not Progressing Note: Patient experiencing confusion at this time. Will continue to educate and reorient as needed.   Problem: Education: Goal: Verbalization of understanding the information provided (i.e., activity precautions, restrictions, etc) will improve Outcome: Not Progressing Note: Patient experiencing confusion at this time. Will continue to educate and reorient as needed. Goal: Individualized Educational Video(s) Outcome: Not Progressing Note: Patient experiencing confusion at this time. Will continue to educate and reorient as needed.   

## 2022-09-11 DIAGNOSIS — S72002D Fracture of unspecified part of neck of left femur, subsequent encounter for closed fracture with routine healing: Secondary | ICD-10-CM

## 2022-09-11 LAB — PHOSPHORUS: Phosphorus: 2.4 mg/dL — ABNORMAL LOW (ref 2.5–4.6)

## 2022-09-11 LAB — GLUCOSE, CAPILLARY
Glucose-Capillary: 106 mg/dL — ABNORMAL HIGH (ref 70–99)
Glucose-Capillary: 108 mg/dL — ABNORMAL HIGH (ref 70–99)
Glucose-Capillary: 109 mg/dL — ABNORMAL HIGH (ref 70–99)
Glucose-Capillary: 113 mg/dL — ABNORMAL HIGH (ref 70–99)
Glucose-Capillary: 121 mg/dL — ABNORMAL HIGH (ref 70–99)
Glucose-Capillary: 89 mg/dL (ref 70–99)
Glucose-Capillary: 96 mg/dL (ref 70–99)

## 2022-09-11 LAB — CBC WITH DIFFERENTIAL/PLATELET
Abs Immature Granulocytes: 0.07 10*3/uL (ref 0.00–0.07)
Basophils Absolute: 0 10*3/uL (ref 0.0–0.1)
Basophils Relative: 0 %
Eosinophils Absolute: 0.3 10*3/uL (ref 0.0–0.5)
Eosinophils Relative: 3 %
HCT: 26.4 % — ABNORMAL LOW (ref 39.0–52.0)
Hemoglobin: 8.5 g/dL — ABNORMAL LOW (ref 13.0–17.0)
Immature Granulocytes: 1 %
Lymphocytes Relative: 11 %
Lymphs Abs: 1.1 10*3/uL (ref 0.7–4.0)
MCH: 30.4 pg (ref 26.0–34.0)
MCHC: 32.2 g/dL (ref 30.0–36.0)
MCV: 94.3 fL (ref 80.0–100.0)
Monocytes Absolute: 0.6 10*3/uL (ref 0.1–1.0)
Monocytes Relative: 6 %
Neutro Abs: 8.5 10*3/uL — ABNORMAL HIGH (ref 1.7–7.7)
Neutrophils Relative %: 79 %
Platelets: 337 10*3/uL (ref 150–400)
RBC: 2.8 MIL/uL — ABNORMAL LOW (ref 4.22–5.81)
RDW: 13.6 % (ref 11.5–15.5)
WBC: 10.7 10*3/uL — ABNORMAL HIGH (ref 4.0–10.5)
nRBC: 0 % (ref 0.0–0.2)

## 2022-09-11 LAB — COMPREHENSIVE METABOLIC PANEL
ALT: 15 U/L (ref 0–44)
AST: 26 U/L (ref 15–41)
Albumin: 2.5 g/dL — ABNORMAL LOW (ref 3.5–5.0)
Alkaline Phosphatase: 71 U/L (ref 38–126)
Anion gap: 6 (ref 5–15)
BUN: 26 mg/dL — ABNORMAL HIGH (ref 8–23)
CO2: 27 mmol/L (ref 22–32)
Calcium: 7.8 mg/dL — ABNORMAL LOW (ref 8.9–10.3)
Chloride: 104 mmol/L (ref 98–111)
Creatinine, Ser: 0.77 mg/dL (ref 0.61–1.24)
GFR, Estimated: >60 mL/min (ref >60)
Glucose, Bld: 107 mg/dL — ABNORMAL HIGH (ref 70–99)
Potassium: 4.4 mmol/L (ref 3.5–5.1)
Sodium: 137 mmol/L (ref 135–145)
Total Bilirubin: 0.7 mg/dL (ref 0.3–1.2)
Total Protein: 4.9 g/dL — ABNORMAL LOW (ref 6.5–8.1)

## 2022-09-11 LAB — HEMOGLOBIN A1C
Hgb A1c MFr Bld: 5.8 % — ABNORMAL HIGH (ref 4.8–5.6)
Mean Plasma Glucose: 119.76 mg/dL

## 2022-09-11 LAB — MAGNESIUM: Magnesium: 1.9 mg/dL (ref 1.7–2.4)

## 2022-09-11 MED ORDER — CHLORHEXIDINE GLUCONATE CLOTH 2 % EX PADS: 6.0000 | MEDICATED_PAD | Freq: Every day | CUTANEOUS | Status: DC

## 2022-09-11 NOTE — Anesthesia Postprocedure Evaluation (Signed)
Anesthesia Post Note  Patient: Chad Sewer Carrico Sr.  Procedure(s) Performed: INTRAMEDULLARY (IM) NAIL INTERTROCHANTERIC (Left: Hip)  Patient location during evaluation: PACU Anesthesia Type: General Level of consciousness: awake and alert Pain management: pain level controlled Vital Signs Assessment: post-procedure vital signs reviewed and stable Respiratory status: spontaneous breathing, nonlabored ventilation, respiratory function stable and patient connected to nasal cannula oxygen Cardiovascular status: blood pressure returned to baseline and stable Postop Assessment: no apparent nausea or vomiting Anesthetic complications: no   No notable events documented.   Last Vitals:  Vitals:   09/10/22 2014 09/10/22 2340  BP: (!) 90/42 (!) 102/55  Pulse: 76 91  Resp: 15 16  Temp: 36.9 C 36.9 C  SpO2: 96% 93%    Last Pain:  Vitals:   09/10/22 1530  TempSrc: Oral  PainSc:                  Lenard Simmer

## 2022-09-11 NOTE — Progress Notes (Signed)
Physical Therapy Treatment Patient Details Name: Chad [name] Sr. MRN: [id number] DOB: 1933/07/18 Today's Date: 09/11/2022   History of Present Illness Chad [name] is an 52yoM who comes to [facility] after a fall at facility, subsequent pain and difficulty tolerating baseline mobility. Imaging revealing of Lt hip fracture, pt now s/p IM nail fixation anfd WBAT. PMH: vascular dementia, CAD, DM2, chronic orthostatic hypotension on midodrine, frequent falls, remote CVA c LUE hemiplegia. Per son, pt has been mostly WC transfers over past few months, only minimal walking, most of which has been with PT at [facility] in Feb. Pt recently transitioned from home with wife to ALF at [facility] about 1-2 weeks prior to this admission. Son reports pt has had a hard time avoiding deconditioning at home this year.    PT Comments    Participated in exercises as described below.  Generally comfortable with supine ex.  BP 116/64 P 75 in anticipation of orhtostatic BP's being taken.  Initiated transition to supine but pt resisting pushing backwards and yelling out that he cannot do it.  Supine rest given and tried to re-initiate but he again resists.  Repositioned in bed for comfort and further mobility deferred at this time.    Recommendations for follow up therapy are one component of a multi-disciplinary discharge planning process, led by the attending physician.  Recommendations may be updated based on patient status, additional functional criteria and insurance authorization.  Follow Up Recommendations       Assistance Recommended at Discharge Intermittent Supervision/Assistance  Patient can return home with the following Two people to help with walking and/or transfers;Two people to help with bathing/dressing/bathroom;Direct supervision/assist for financial management;Direct supervision/assist for medications management;Help with stairs or ramp for entrance   Equipment Recommendations        Recommendations for Other Services       Precautions / Restrictions Precautions Precautions: Fall Precaution Comments: chronic orthostatic hypotnesion without symptoms due to dementia, takes midodrine at baseline Restrictions LLE Weight Bearing: Weight bearing as tolerated     Mobility  Bed Mobility Overal bed mobility: Needs Assistance Bed Mobility: Supine to Sit, Sit to Supine     Supine to sit: Total assist Sit to supine: Total assist        Transfers                        Ambulation/Gait                   Stairs             Wheelchair Mobility    Modified Rankin (Stroke Patients Only)       Balance                                            Cognition Arousal/Alertness: Awake/alert Behavior During Therapy: WFL for tasks assessed/performed Overall Cognitive Status: Within Functional Limits for tasks assessed                                          Exercises Other Exercises Other Exercises: BLE AAROM x 10    General Comments        Pertinent Vitals/Pain Pain Assessment Pain Assessment: Faces Faces Pain Scale: Hurts whole lot Pain  Descriptors / Indicators: Sore Pain Intervention(s): Limited activity within patient's tolerance, Monitored during session, Repositioned    Home Living                          Prior Function            PT Goals (current goals can now be found in the care plan section) Progress towards PT goals: Progressing toward goals    Frequency    7X/week      PT Plan Current plan remains appropriate    Co-evaluation              AM-PAC PT "6 Clicks" Mobility   Outcome Measure  Help needed turning from your back to your side while in a flat bed without using bedrails?: Total Help needed moving from lying on your back to sitting on the side of a flat bed without using bedrails?: Total Help needed moving to and from a bed to a chair  (including a wheelchair)?: Total Help needed standing up from a chair using your arms (e.g., wheelchair or bedside chair)?: Total Help needed to walk in hospital room?: Total Help needed climbing 3-5 steps with a railing? : Total 6 Click Score: 6    End of Session Equipment Utilized During Treatment: Gait belt Activity Tolerance: Patient limited by pain Patient left: in bed;with call bell/phone within reach Nurse Communication: Mobility status PT Visit Diagnosis: History of falling (Z91.81);Muscle weakness (generalized) (M62.81)     Time: 1610-9604 PT Time Calculation (min) (ACUTE ONLY): 16 min  Charges:  $Therapeutic Exercise: 8-22 mins                   Danielle Dess, PTA 09/11/22, 10:34 AM

## 2022-09-11 NOTE — Progress Notes (Signed)
Dyon Waychoff Caley Sr. ZOX:[id number] DOB: 26-Mar-1934 DOA: 09/09/2022 PCP: Tessie Fass, MD   SubjElaina Pattee Laurel Sr. is a 89 y.o. male PMHx CAD, Vascular Dementia end-stage, Hx CVA, DM type II without complication, Hx seizures, BPH  Presenting with left hip fracture.  History primarily from patient's son in the setting of end-stage dementia.  Per report, patient had a fall at facility approximately 3 to 4 days ago.  No reported head trauma loss consciousness associated with fall.  Patient minimally ambulating at baseline.  Son who is an EMT evaluated patient and noted that his left leg was shortened and internally rotation with mild bruising.  Upon evaluating patient was unable to ambulate though was noted to have some degree of ambulation with movement with other family seeing him.  No fevers or chills.  No nausea or vomiting.  No hemiparesis.  Baseline generalized confusion. Presented to the ER afebrile, hemodynamically stable.  White count 8.8, hemoglobin 10.3, creatinine 0.8.  CT head within normal limits.  Chest x-ray stable.  Left hip plain films which showed an comminuted and displaced intertrochanteric fracture.  Per Dr. Roxan Hockey in the ER, case discussed with on-call orthopedic surgeon Dr. Audelia Acton who will evaluate the patient. Review of Systems: As mentioned in the history of present illness. All other systems reviewed and are negative.   Obj: 5/11 afebrile overnight A/O x 4, does not remember whether he has been out of bed to chair today.   Objective: VITAL SIGNS: Temp: 98.2 F (36.8 C) (05/11 1551) Temp Source: Oral (05/11 1551) BP: 118/61 (05/11 1551) Pulse Rate: 73 (05/11 1551)   VENTILATOR SETTINGS: **  Procedures/Significant Events: 5/9 s/p INTRAMEDULLARY (IM) NAIL INTERTROCHANTERIC (Left)    Consultants:  Orthopedic surgery Dr. Reinaldo Berber   Cultures   Antimicrobials:   Intake/Output Summary (Last 24 hours) at 09/11/2022 1608 Last data filed at  09/11/2022 1440 Gross per 24 hour  Intake 360 ml  Output 1100 ml  Net -740 ml     Physical Exam:  General: A/O x 4, positive short-term memory loss, No acute respiratory distress Eyes: negative scleral hemorrhage, negative anisocoria, negative icterus ENT: Negative Runny nose, negative gingival bleeding, Neck:  Negative scars, masses, torticollis, lymphadenopathy, JVD Lungs: Clear to auscultation bilaterally without wheezes or crackles Cardiovascular: Regular rate and rhythm without murmur gallop or rub normal S1 and S2 Abdomen: negative abdominal pain, nondistended, positive soft, bowel sounds, no rebound, no ascites, no appreciable mass Extremities: LEFT hip decreased tenderness to touch.  Honeycomb bandages in place over incision site negative sign of infection  Skin: Negative rashes, lesions, ulcers Psychiatric:  Negative depression, negative anxiety, negative fatigue, negative mania  Central nervous system:  Cranial nerves II through XII intact, tongue/uvula midline, all extremities muscle strength 5/5, sensation intact throughout, negative dysarthria, negative expressive aphasia, negative receptive aphasia.   .   DVT prophylaxis: Lovenox Code Status: Full Family Communication:  Status is: Inpatient    Dispo: The patient is from: Home              Anticipated d/c is to: SNF pending              Anticipated d/c date is:               Patient currently is not medically stable to d/c.      Assessment & Plan: Covid vaccination;   Principal Problem:   Hip fracture (HCC) Active Problems:   Diabetes mellitus type 2, uncomplicated (HCC)  Vascular dementia (HCC)   CAD S/P percutaneous coronary angioplasty   BPH (benign prostatic hyperplasia)   Closed displaced intertrochanteric fracture of left femur (HCC)   Malnutrition of moderate degree   Hip fracture (HCC) -Recent fall w/ Comminuted and displaced intertrochanteric fracture on imaging -5/9 s/p INTRAMEDULLARY  (IM) NAIL INTERTROCHANTERIC (Left)   -5/10 per orthopedic surgery  -Lovenox 40 mg subcu daily x 14 days at discharge  -Weightbearing as tolerated left lower extremity  -Patient can begin showering/bathing in 3 days with honeycomb dressing.   -Patient can remove honeycomb dressing 1 week postop and continue showering/bathing with no  Dressing -5/11 out of bed to chair every shift, WBOT QID    BPH (benign prostatic hyperplasia) -Finasteride 5 mg daily -5/11 Foley removed: Voiding trial   CAD S/P percutaneous coronary angioplasty/Hypotension -Hold Imdur secondary to patient's relative hypotension - Midodrine 10 mg TID   Vascular dementia end-stage (HCC) -Baseline (at baseline not oriented to person, place, time) -Benazepril 10 mg daily - Lamictal 200 mg daily - Seroquel 25 mg BID -Zoloft 100 mg daily  Diabetes mellitus type 2, uncomplicated (HCC) -5/10 hemoglobin A1c pending -5/10 sensitive SSI  Malnutrition of moderate degree -  Hypocalcemia -Calcium goal> 8.9 -5/10 corrected calcium= 9.1 no action required  Goals of care - 5/11 PASRR pending for SNF placement     Mobility Assessment (last 72 hours)     Mobility Assessment     Row Name 09/11/22 1029 09/10/22 2029 09/10/22 1212 09/10/22 0900 09/09/22 2053   Does patient have an order for bedrest or is patient medically unstable -- No - Continue assessment -- -- No - Continue assessment   What is the highest level of mobility based on the progressive mobility assessment? Level 1 (Bedfast) - Unable to balance while sitting on edge of bed Level 1 (Bedfast) - Unable to balance while sitting on edge of bed Level 1 (Bedfast) - Unable to balance while sitting on edge of bed Level 1 (Bedfast) - Unable to balance while sitting on edge of bed Level 1 (Bedfast) - Unable to balance while sitting on edge of bed   Is the above level different from baseline mobility prior to current illness? -- Yes - Recommend PT order -- Yes -  Recommend PT order Yes - Recommend PT order    Row Name 09/09/22 1645           Does patient have an order for bedrest or is patient medically unstable No - Continue assessment                     Time: 50 minutes         Care during the described time interval was provided by me .  I have reviewed this patient's available data, including medical history, events of note, physical examination, and all test results as part of my evaluation.

## 2022-09-11 NOTE — TOC Progression Note (Signed)
Transition of Care (TOC) - Progression Note    Patient Details  Name: Jonh Poffenberger Yount Sr. MRN: [id number] Date of Birth: [birth year]  Transition of [facility]) CM/SW Contact  Liliana Cline, LCSW Phone Number: 09/11/2022, 11:26 AM  Clinical Narrative:    Spoke to son and provided bed offers. He chose [facility]. PASRR information uploaded. PASRR CURRENTLY PENDING.  Reached out to Tiffany at [facility] to accept bed offer and inquire if patient can come Monday if medically ready and if PASRR is back. Awaiting reply.    Expected Discharge Plan: Skilled Nursing Facility Barriers to Discharge: Continued Medical Work up  Expected Discharge Plan and Services       Living arrangements for the past 2 months: Assisted Living Facility                                       Social Determinants of Health (SDOH) Interventions SDOH Screenings   Food Insecurity: No Food Insecurity (09/09/2022)  Housing: Low Risk  (09/09/2022)  Transportation Needs: No Transportation Needs (09/09/2022)  Utilities: Not At Risk (09/09/2022)  Tobacco Use: Medium Risk (09/10/2022)    Readmission Risk Interventions     No data to display

## 2022-09-11 NOTE — Progress Notes (Signed)
Subjective: 2 Days Post-Op Procedure(s) (LRB): INTRAMEDULLARY (IM) NAIL INTERTROCHANTERIC (Left) Patient reports pain as mild.   Patient is well, and has had no acute complaints or problems Denies any CP, SOB, ABD pain. We will continue with physical therapy today.  Plan is to go Skilled nursing facility after hospital stay.  Objective: Vital signs in last 24 hours: Temp:  [98.1 F (36.7 C)-98.9 F (37.2 C)] 98.9 F (37.2 C) (05/11 0815) Pulse Rate:  [68-91] 68 (05/11 0815) Resp:  [15-18] 18 (05/11 0815) BP: (90-131)/(42-67) 118/67 (05/11 0815) SpO2:  [93 %-100 %] 100 % (05/11 0815)  Intake/Output from previous day: 05/10 0701 - 05/11 0700 In: -  Out: 1400 [Urine:1400] Intake/Output this shift: No intake/output data recorded.  Recent Labs    09/09/22 1104 09/10/22 0435 09/11/22 0607  HGB 10.3* 8.9* 8.5*   Recent Labs    09/10/22 0435 09/11/22 0607  WBC 10.9* 10.7*  RBC 2.97* 2.80*  HCT 27.7* 26.4*  PLT 316 337   Recent Labs    09/10/22 0435 09/11/22 0607  NA 136 137  K 4.1 4.4  CL 104 104  CO2 27 27  BUN 26* 26*  CREATININE 0.98 0.77  GLUCOSE 148* 107*  CALCIUM 7.9* 7.8*   No results for input(s): "LABPT", "INR" in the last 72 hours.  Labs reviewed -patient's white count is improving, however he is still decreasing with his H&H.  We will reassess with another CBC tomorrow morning.  EXAM General - Patient is Alert, Appropriate, and Confused Extremity - Neurovascular intact Sensation intact distally Intact pulses distally Dorsiflexion/Plantar flexion intact No cellulitis present Compartment soft Surrounding skin to be incision site is slightly warm to the touch, however there is no redness or swelling noted with mild ecchymosis -no drainage appreciated Dressing - dressing C/D/I and no drainage Motor Function - intact, moving foot and toes well on exam.  Patient attempts to straight leg raise, is able to just get his foot off the bed, before  stating that it is too painful.  Past Medical History:  Diagnosis Date   Coronary artery disease    Depression    Diabetes mellitus without complication (HCC)    Seizures (HCC)    Stroke (HCC)    Vascular dementia (HCC)     Assessment/Plan:   2 Days Post-Op Procedure(s) (LRB): INTRAMEDULLARY (IM) NAIL INTERTROCHANTERIC (Left) Principal Problem:   Hip fracture (HCC) Active Problems:   Diabetes mellitus type 2, uncomplicated (HCC)   Vascular dementia (HCC)   CAD S/P percutaneous coronary angioplasty   BPH (benign prostatic hyperplasia)   Closed displaced intertrochanteric fracture of left femur (HCC)   Malnutrition of moderate degree  Estimated body mass index is 18.23 kg/m as calculated from the following:   Height as of this encounter: 5\' 9"  (1.753 m).   Weight as of this encounter: 56 kg. Advance diet Up with therapy -can attempt to work on in bed exercises if patient does not tolerate out edge of bed or out of bed therapy.  Vital signs are stable, not tachycardic, BP has improved.  Patient asymptomatic.  Continue to monitor.  Pain well-controlled at rest  Acute on chronic postop blood loss anemia -hemoglobin 8.5.  Recheck hemoglobin tomorrow morning.  Can continue to trend hematocrit and white blood count with a CBC (ordered).  Care management to assist with discharge to skilled nursing facility.  Patient will need 2-week follow-up with [facility] orthopedics for staple removal and x-rays. Lovenox 40 mg subcu daily x 14  days at discharge Weightbearing as tolerated left lower extremity Patient can begin showering/bathing in 3 days with honeycomb dressing.  Patient can remove honeycomb dressing 1 week postop and continue showering/bathing with no dressing   DVT Prophylaxis - Lovenox, TED hose, and SCDs Weight-Bearing as tolerated to left leg  Danise Edge, PA-C [facility] 09/11/2022, 9:04 AM   Patient seen and examined, agree with above plan.  The  patient is doing well status post left hip intramedullary nanil, no concerns at this time. Some post op anemia will recheck tomorrow morning.  Pain is controlled.  Discussed DVT prophylaxis, pain medication use, and safe transition to back to rehab with son.  All questions answered .   Reinaldo Berber MD

## 2022-09-12 LAB — CBC WITH DIFFERENTIAL/PLATELET
Abs Immature Granulocytes: 0.1 10*3/uL — ABNORMAL HIGH (ref 0.00–0.07)
Basophils Absolute: 0.1 10*3/uL (ref 0.0–0.1)
Basophils Relative: 1 %
Eosinophils Absolute: 0.3 10*3/uL (ref 0.0–0.5)
Eosinophils Relative: 3 %
HCT: 27.3 % — ABNORMAL LOW (ref 39.0–52.0)
Hemoglobin: 8.7 g/dL — ABNORMAL LOW (ref 13.0–17.0)
Immature Granulocytes: 1 %
Lymphocytes Relative: 11 %
Lymphs Abs: 1.2 10*3/uL (ref 0.7–4.0)
MCH: 29.8 pg (ref 26.0–34.0)
MCHC: 31.9 g/dL (ref 30.0–36.0)
MCV: 93.5 fL (ref 80.0–100.0)
Monocytes Absolute: 0.8 10*3/uL (ref 0.1–1.0)
Monocytes Relative: 8 %
Neutro Abs: 8.3 10*3/uL — ABNORMAL HIGH (ref 1.7–7.7)
Neutrophils Relative %: 76 %
Platelets: 367 10*3/uL (ref 150–400)
RBC: 2.92 MIL/uL — ABNORMAL LOW (ref 4.22–5.81)
RDW: 13.6 % (ref 11.5–15.5)
WBC: 10.9 10*3/uL — ABNORMAL HIGH (ref 4.0–10.5)
nRBC: 0 % (ref 0.0–0.2)

## 2022-09-12 LAB — COMPREHENSIVE METABOLIC PANEL
ALT: 14 U/L (ref 0–44)
AST: 23 U/L (ref 15–41)
Albumin: 2.5 g/dL — ABNORMAL LOW (ref 3.5–5.0)
Alkaline Phosphatase: 75 U/L (ref 38–126)
Anion gap: 8 (ref 5–15)
BUN: 22 mg/dL (ref 8–23)
CO2: 25 mmol/L (ref 22–32)
Calcium: 8.1 mg/dL — ABNORMAL LOW (ref 8.9–10.3)
Chloride: 99 mmol/L (ref 98–111)
Creatinine, Ser: 0.83 mg/dL (ref 0.61–1.24)
GFR, Estimated: >60 mL/min (ref >60)
Glucose, Bld: 107 mg/dL — ABNORMAL HIGH (ref 70–99)
Potassium: 4.1 mmol/L (ref 3.5–5.1)
Sodium: 132 mmol/L — ABNORMAL LOW (ref 135–145)
Total Bilirubin: 1.1 mg/dL (ref 0.3–1.2)
Total Protein: 5.2 g/dL — ABNORMAL LOW (ref 6.5–8.1)

## 2022-09-12 LAB — PHOSPHORUS: Phosphorus: 2.9 mg/dL (ref 2.5–4.6)

## 2022-09-12 LAB — GLUCOSE, CAPILLARY
Glucose-Capillary: 102 mg/dL — ABNORMAL HIGH (ref 70–99)
Glucose-Capillary: 91 mg/dL (ref 70–99)
Glucose-Capillary: 103 mg/dL — ABNORMAL HIGH (ref 70–99)
Glucose-Capillary: 101 mg/dL — ABNORMAL HIGH (ref 70–99)
Glucose-Capillary: 101 mg/dL — ABNORMAL HIGH (ref 70–99)

## 2022-09-12 LAB — MAGNESIUM: Magnesium: 1.9 mg/dL (ref 1.7–2.4)

## 2022-09-12 NOTE — TOC Progression Note (Addendum)
Transition of Care (TOC) - Progression Note    Patient Details  Name: Chad Gildea Inthavong Sr. MRN: [id number] Date of Birth: [birth year]  Transition of Care [facility]) CM/SW Contact  Liliana Cline, LCSW Phone Number: 09/12/2022, 8:57 AM  Clinical Narrative:    Cherlyn Roberts is still pending at this time. TOC will continue to follow. CSW also reached out to [place] at [facility] again to inquire if they can take patient tomorrow if PASRR is back. Awaiting response.  4:43- Spoke with Market researcher at [facility]. She confirms patient can come tomorrow if PASRR screening comes back.   Expected Discharge Plan: Skilled Nursing Facility Barriers to Discharge: Continued Medical Work up  Expected Discharge Plan and Services       Living arrangements for the past 2 months: Assisted Living Facility                                       Social Determinants of Health (SDOH) Interventions SDOH Screenings   Food Insecurity: No Food Insecurity (09/09/2022)  Housing: Low Risk  (09/09/2022)  Transportation Needs: No Transportation Needs (09/09/2022)  Utilities: Not At Risk (09/09/2022)  Tobacco Use: Medium Risk (09/10/2022)    Readmission Risk Interventions     No data to display

## 2022-09-12 NOTE — Progress Notes (Signed)
Subjective: 3 Days Post-Op Procedure(s) (LRB): INTRAMEDULLARY (IM) NAIL INTERTROCHANTERIC (Left) Patient is pleasantly confused, however, reports pain as mild at rest but increases when attempting to move. States he's very tired. Patient is well, and has had no acute complaints or problems Denies any CP, SOB, ABD pain. We will continue with physical therapy today. Physical therapy present in room with examination - Was able to get to the edge of the bed. Patient reported pain of his back and L leg with any attempted movement, very stiff. States he feels a little dizzy sitting at the edge of the bed. Plan is to go Skilled nursing facility after hospital stay.  Objective: Vital signs in last 24 hours: Temp:  [98.2 F (36.8 C)-98.4 F (36.9 C)] 98.4 F (36.9 C) (05/12 0901) Pulse Rate:  [62-73] 62 (05/12 0901) Resp:  [17-18] 17 (05/12 0901) BP: (100-119)/(52-61) 100/52 (05/12 0901) SpO2:  [93 %-95 %] 94 % (05/12 0901)  Intake/Output from previous day: 05/11 0701 - 05/12 0700 In: 360 [P.O.:360] Out: -  Intake/Output this shift: No intake/output data recorded.  Recent Labs    09/09/22 1104 09/10/22 0435 09/11/22 0607 09/12/22 0530  HGB 10.3* 8.9* 8.5* 8.7*   Recent Labs    09/11/22 0607 09/12/22 0530  WBC 10.7* 10.9*  RBC 2.80* 2.92*  HCT 26.4* 27.3*  PLT 337 367   Recent Labs    09/11/22 0607 09/12/22 0530  NA 137 132*  K 4.4 4.1  CL 104 99  CO2 27 25  BUN 26* 22  CREATININE 0.77 0.83  GLUCOSE 107* 107*  CALCIUM 7.8* 8.1*   No results for input(s): "LABPT", "INR" in the last 72 hours.  Labs reviewed -patient's white count is up from 10.7 to 10.9, but his H&H has improved. Has had a notable drop in Na+ from 137 to 132. Continue to monitor with daily labs.  EXAM General - Patient is Alert, Appropriate, and Confused Extremity - Neurovascular intact Sensation intact distally Intact pulses distally Dorsiflexion/Plantar flexion intact No cellulitis  present Compartment soft Surrounding skin to be incision site is slightly warm to the touch, however there is no redness or drainage. There is mild swelling noted with moderate ecchymosis down the incision site and into the knee, no drainage appreciated Peripheral pulses palpated L hand contracture - consistent with past CVA Dressing - dressing C/D/I and no drainage Motor Function - intact, moving foot and toes well on exam.  Patient attempts to straight leg raise, is able to just get his foot off the bed, before stating that it is too painful.  Past Medical History:  Diagnosis Date   Coronary artery disease    Depression    Diabetes mellitus without complication (HCC)    Seizures (HCC)    Stroke (HCC)    Vascular dementia (HCC)     Assessment/Plan:   3 Days Post-Op Procedure(s) (LRB): INTRAMEDULLARY (IM) NAIL INTERTROCHANTERIC (Left) Principal Problem:   Hip fracture (HCC) Active Problems:   Diabetes mellitus type 2, uncomplicated (HCC)   Vascular dementia (HCC)   CAD S/P percutaneous coronary angioplasty   BPH (benign prostatic hyperplasia)   Closed displaced intertrochanteric fracture of left femur (HCC)   Malnutrition of moderate degree  Estimated body mass index is 18.23 kg/m as calculated from the following:   Height as of this encounter: 5\' 9"  (1.753 m).   Weight as of this encounter: 56 kg. Advance diet Up with therapy -can attempt to work on in bed exercises as patient is  not tolerating out of bed therapy.  Vital signs are stable, not tachycardic, BP has improved, however still soft.  Patient asymptomatic.  Continue to monitor.  Pain well-controlled at rest, however a lot of pain with any movement of L leg or sitting up  Acute on chronic postop blood loss anemia -hemoglobin up to 8.9 today.   Care management to assist with discharge to skilled nursing facility.  Patient will need 2-week follow-up with [facility] orthopedics for staple removal and x-rays. Lovenox 40 mg  subcu daily x 14 days at discharge Weightbearing as tolerated left lower extremity Patient can begin showering/bathing in 3 days with honeycomb dressing.  Patient can remove honeycomb dressing 1 week postop and continue showering/bathing with no dressing   DVT Prophylaxis - Lovenox, TED hose, and SCDs Weight-Bearing as tolerated to left leg  Danise Edge, PA-C [facility] 09/12/2022, 9:55 AM

## 2022-09-12 NOTE — Progress Notes (Signed)
Physical Therapy Treatment Patient Details Name: Chad [name]. MRN: [id number] DOB: 08/13/1933 Today's Date: 09/12/2022   History of Present Illness Chad [name] is an 67yoM who comes to [facility] after a fall at facility, subsequent pain and difficulty tolerating baseline mobility. Imaging revealing of Lt hip fracture, pt now s/p IM nail fixation anfd WBAT. PMH: vascular dementia, CAD, DM2, chronic orthostatic hypotension on midodrine, frequent falls, remote CVA c LUE hemiplegia. Per son, pt has been mostly WC transfers over past few months, only minimal walking, most of which has been with PT at [facility] in Feb. Pt recently transitioned from home with wife to ALF at [facility] about 1-2 weeks prior to this admission. Son reports pt has had a hard time avoiding deconditioning at home this year.    PT Comments    Pt in bed with ortho PA in room.  Assisted pt to EOB with PA assist.  Max a x 2 to transition to sitting.  He continued to need varied support for sitting with min-mod a x 2.  Pt continues with post lean primarily.  He is able to hold position with light assist briefly but will quickly return to post lean.  He c/o mostly back pain in sitting and some dizziness.  Max a x 2 lateral scoot to reposition in bed in sitting then dependant transition back to supine and to reposition.  Pt noted to be inc urine and required dependant assist for rolling to allow for linen change.  Pt generally fatigued with activity.     Recommendations for follow up therapy are one component of a multi-disciplinary discharge planning process, led by the attending physician.  Recommendations may be updated based on patient status, additional functional criteria and insurance authorization.  Follow Up Recommendations       Assistance Recommended at Discharge Intermittent Supervision/Assistance  Patient can return home with the following Two people to help with walking and/or transfers;Two people to help with  bathing/dressing/bathroom;Direct supervision/assist for financial management;Direct supervision/assist for medications management;Help with stairs or ramp for entrance;Assist for transportation;Assistance with cooking/housework   Equipment Recommendations       Recommendations for Other Services       Precautions / Restrictions Precautions Precautions: Fall Precaution Comments: chronic orthostatic hypotnesion without symptoms due to dementia, takes midodrine at baseline Restrictions LLE Weight Bearing: Weight bearing as tolerated     Mobility  Bed Mobility Overal bed mobility: Needs Assistance Bed Mobility: Supine to Sit, Sit to Supine     Supine to sit: Max assist, +2 for physical assistance Sit to supine: Max assist, +2 for physical assistance     Patient Response: Flat affect  Transfers                   General transfer comment: deferred due to needing significant assist for sitting balance    Ambulation/Gait               General Gait Details: not appropriate to attempt   Stairs             Wheelchair Mobility    Modified Rankin (Stroke Patients Only)       Balance Overall balance assessment: Needs assistance Sitting-balance support: Feet supported Sitting balance-Leahy Scale: Poor Sitting balance - Comments: +1 - +2 hands on assist at all times to remain upright.  is able to support himself briefly with light assist but will return to post lean soon after. Postural control: Posterior lean, Left lateral lean  Standing balance-Leahy Scale: Zero Standing balance comment: anticiapte dependant assist for standing attempts given sitting balance quality                            Cognition Arousal/Alertness: Awake/alert Behavior During Therapy: WFL for tasks assessed/performed Overall Cognitive Status: Within Functional Limits for tasks assessed                                          Exercises Other  Exercises Other Exercises: rolling left/rigth for linen change due to inc urine.    General Comments        Pertinent Vitals/Pain Pain Assessment Pain Assessment: Faces Faces Pain Scale: Hurts even more Pain Location: Left hip while seated and with transitions Pain Descriptors / Indicators: Sore Pain Intervention(s): Limited activity within patient's tolerance, Monitored during session, Repositioned, Premedicated before session    Home Living                          Prior Function            PT Goals (current goals can now be found in the care plan section) Progress towards PT goals: Progressing toward goals    Frequency    7X/week      PT Plan Current plan remains appropriate    Co-evaluation              AM-PAC PT "6 Clicks" Mobility   Outcome Measure  Help needed turning from your back to your side while in a flat bed without using bedrails?: Total Help needed moving from lying on your back to sitting on the side of a flat bed without using bedrails?: Total Help needed moving to and from a bed to a chair (including a wheelchair)?: Total Help needed standing up from a chair using your arms (e.g., wheelchair or bedside chair)?: Total Help needed to walk in hospital room?: Total Help needed climbing 3-5 steps with a railing? : Total 6 Click Score: 6    End of Session   Activity Tolerance: Patient limited by pain;Patient limited by fatigue Patient left: in bed;with call bell/phone within reach;with bed alarm set Nurse Communication: Mobility status PT Visit Diagnosis: History of falling (Z91.81);Muscle weakness (generalized) (M62.81)     Time: 0981-1914 PT Time Calculation (min) (ACUTE ONLY): 16 min  Charges:  $Therapeutic Activity: 8-22 mins                   Danielle Dess, PTA 09/12/22, 10:50 AM

## 2022-09-12 NOTE — Progress Notes (Signed)
Chad [name]. ZOX:[id number] DOB: 02-07-1934 DOA: 09/09/2022 PCP: Tessie Fass, MD   SubjElaina Pattee Counts [name]. is a 89 y.o. male PMHx CAD, Vascular Dementia end-stage, Hx CVA, DM type II without complication, Hx seizures, BPH  Presenting with left hip fracture.  History primarily from patient's son in the setting of end-stage dementia.  Per report, patient had a fall at facility approximately 3 to 4 days ago.  No reported head trauma loss consciousness associated with fall.  Patient minimally ambulating at baseline.  Son who is an EMT evaluated patient and noted that his left leg was shortened and internally rotation with mild bruising.  Upon evaluating patient was unable to ambulate though was noted to have some degree of ambulation with movement with other family seeing him.  No fevers or chills.  No nausea or vomiting.  No hemiparesis.  Baseline generalized confusion. Presented to the ER afebrile, hemodynamically stable.  White count 8.8, hemoglobin 10.3, creatinine 0.8.  CT head within normal limits.  Chest x-ray stable.  Left hip plain films which showed an comminuted and displaced intertrochanteric fracture.  Per Dr. Roxan Hockey in the ER, case discussed with on-call orthopedic surgeon Dr. Audelia Acton who will evaluate the patient. Review of Systems: As mentioned in the history of present illness. All other systems reviewed and are negative.   Obj: 5/12 afebrile overnight A/O x 4, LEFT hip pain well-controlled per patient.  Sitting in bed comfortably eating meal   Objective: VITAL SIGNS: Temp: 98.4 F (36.9 C) (05/12 0901) Temp Source: Oral (05/12 0901) BP: 100/52 (05/12 0901) Pulse Rate: 62 (05/12 0901)   VENTILATOR SETTINGS: **  Procedures/Significant Events: 5/9 s/p INTRAMEDULLARY (IM) NAIL INTERTROCHANTERIC (Left)    Consultants:  Orthopedic surgery Dr. Reinaldo Berber   Cultures   Antimicrobials:   Intake/Output Summary (Last 24 hours) at 09/12/2022 1424 Last  data filed at 09/12/2022 1026 Gross per 24 hour  Intake 360 ml  Output --  Net 360 ml    Physical Exam:  General: A/O x 4, No acute respiratory distress Eyes: negative scleral hemorrhage, negative anisocoria, negative icterus ENT: Negative Runny nose, negative gingival bleeding, Neck:  Negative scars, masses, torticollis, lymphadenopathy, JVD Lungs: Clear to auscultation bilaterally without wheezes or crackles Cardiovascular: Regular rate and rhythm without murmur gallop or rub normal S1 and S2 Abdomen: negative abdominal pain, nondistended, positive soft, bowel sounds, no rebound, no ascites, no appreciable mass Extremities: negative LEFT hip tenderness to palpation, honeycomb bandages negative sign of infection or bleeding. Skin: Negative rashes, lesions, ulcers Psychiatric:  Negative depression, negative anxiety, negative fatigue, negative mania  Central nervous system:  Cranial nerves II through XII intact, tongue/uvula midline, all extremities muscle strength 5/5, sensation intact throughout, negative dysarthria, negative expressive aphasia, negative receptive aphasia.   .   DVT prophylaxis: Lovenox Code Status: Full Family Communication:  Status is: Inpatient    Dispo: The patient is from: Home              Anticipated d/c is to: SNF pending              Anticipated d/c date is:               Patient currently is not medically stable to d/c.      Assessment & Plan: Covid vaccination;   Principal Problem:   Hip fracture (HCC) Active Problems:   Diabetes mellitus type 2, uncomplicated (HCC)   Vascular dementia (HCC)   CAD S/P percutaneous coronary angioplasty  BPH (benign prostatic hyperplasia)   Closed displaced intertrochanteric fracture of left femur (HCC)   Malnutrition of moderate degree   Hip fracture (HCC) -Recent fall w/ Comminuted and displaced intertrochanteric fracture on imaging -5/9 s/p INTRAMEDULLARY (IM) NAIL INTERTROCHANTERIC (Left)   -5/10  per orthopedic surgery  -Lovenox 40 mg subcu daily x 14 days at discharge  -Weightbearing as tolerated left lower extremity  -Patient can begin showering/bathing in 3 days with honeycomb dressing.   -Patient can remove honeycomb dressing 1 week postop and continue showering/bathing with no  Dressing -5/11 out of bed to chair every shift, WBOT QID    BPH (benign prostatic hyperplasia) -Finasteride 5 mg daily -5/11 Foley removed: Voiding trial   CAD S/P percutaneous coronary angioplasty/Hypotension -Hold Imdur secondary to patient's relative hypotension - Midodrine 10 mg TID   Vascular dementia end-stage (HCC) -Baseline (at baseline not oriented to person, place, time) -Benazepril 10 mg daily - Lamictal 200 mg daily - Seroquel 25 mg BID -Zoloft 100 mg daily  Diabetes mellitus type 2, uncomplicated (HCC) -5/10 hemoglobin A1c pending -5/10 sensitive SSI  Anemia unspecified - 5/12 possibly acute blood loss secondary to surgery, will obtain anemia panel. - Transfuse for hemoglobin<7  Malnutrition of moderate degree -  Hypocalcemia -Calcium goal> 8.9 -5/10 corrected calcium= 9.1 no action required  Goals of care - 5/11 PASRR pending for SNF placement     Mobility Assessment (last 72 hours)     Mobility Assessment     Row Name 09/12/22 1046 09/11/22 2110 09/11/22 1029 09/10/22 2029 09/10/22 1212   Does patient have an order for bedrest or is patient medically unstable -- No - Continue assessment -- No - Continue assessment --   What is the highest level of mobility based on the progressive mobility assessment? Level 1 (Bedfast) - Unable to balance while sitting on edge of bed Level 1 (Bedfast) - Unable to balance while sitting on edge of bed Level 1 (Bedfast) - Unable to balance while sitting on edge of bed Level 1 (Bedfast) - Unable to balance while sitting on edge of bed Level 1 (Bedfast) - Unable to balance while sitting on edge of bed   Is the above level different from  baseline mobility prior to current illness? -- Yes - Recommend PT order -- Yes - Recommend PT order --    Row Name 09/10/22 0900 09/09/22 2053 09/09/22 1645       Does patient have an order for bedrest or is patient medically unstable -- No - Continue assessment No - Continue assessment     What is the highest level of mobility based on the progressive mobility assessment? Level 1 (Bedfast) - Unable to balance while sitting on edge of bed Level 1 (Bedfast) - Unable to balance while sitting on edge of bed --     Is the above level different from baseline mobility prior to current illness? Yes - Recommend PT order Yes - Recommend PT order --                   Time: 50 minutes         Care during the described time interval was provided by me .  I have reviewed this patient's available data, including medical history, events of note, physical examination, and all test results as part of my evaluation.

## 2022-09-13 DIAGNOSIS — D509 Iron deficiency anemia, unspecified: Secondary | ICD-10-CM

## 2022-09-13 DIAGNOSIS — D638 Anemia in other chronic diseases classified elsewhere: Secondary | ICD-10-CM

## 2022-09-13 LAB — COMPREHENSIVE METABOLIC PANEL
ALT: 15 U/L (ref 0–44)
AST: 24 U/L (ref 15–41)
Albumin: 2.6 g/dL — ABNORMAL LOW (ref 3.5–5.0)
Alkaline Phosphatase: 78 U/L (ref 38–126)
Anion gap: 6 (ref 5–15)
BUN: 28 mg/dL — ABNORMAL HIGH (ref 8–23)
CO2: 30 mmol/L (ref 22–32)
Calcium: 8.5 mg/dL — ABNORMAL LOW (ref 8.9–10.3)
Chloride: 103 mmol/L (ref 98–111)
Creatinine, Ser: 0.91 mg/dL (ref 0.61–1.24)
GFR, Estimated: >60 mL/min (ref >60)
Glucose, Bld: 97 mg/dL (ref 70–99)
Potassium: 4.5 mmol/L (ref 3.5–5.1)
Sodium: 137 mmol/L (ref 135–145)
Total Bilirubin: 0.9 mg/dL (ref 0.3–1.2)
Total Protein: 5.4 g/dL — ABNORMAL LOW (ref 6.5–8.1)

## 2022-09-13 LAB — GLUCOSE, CAPILLARY
Glucose-Capillary: 102 mg/dL — ABNORMAL HIGH (ref 70–99)
Glucose-Capillary: 129 mg/dL — ABNORMAL HIGH (ref 70–99)
Glucose-Capillary: 154 mg/dL — ABNORMAL HIGH (ref 70–99)
Glucose-Capillary: 172 mg/dL — ABNORMAL HIGH (ref 70–99)
Glucose-Capillary: 90 mg/dL (ref 70–99)
Glucose-Capillary: 96 mg/dL (ref 70–99)
Glucose-Capillary: 98 mg/dL (ref 70–99)

## 2022-09-13 LAB — CBC WITH DIFFERENTIAL/PLATELET
Abs Immature Granulocytes: 0.05 10*3/uL (ref 0.00–0.07)
Basophils Absolute: 0 10*3/uL (ref 0.0–0.1)
Basophils Relative: 1 %
Eosinophils Absolute: 0.4 10*3/uL (ref 0.0–0.5)
Eosinophils Relative: 5 %
HCT: 28.5 % — ABNORMAL LOW (ref 39.0–52.0)
Hemoglobin: 9 g/dL — ABNORMAL LOW (ref 13.0–17.0)
Immature Granulocytes: 1 %
Lymphocytes Relative: 15 %
Lymphs Abs: 1.2 10*3/uL (ref 0.7–4.0)
MCH: 29.9 pg (ref 26.0–34.0)
MCHC: 31.6 g/dL (ref 30.0–36.0)
MCV: 94.7 fL (ref 80.0–100.0)
Monocytes Absolute: 0.7 10*3/uL (ref 0.1–1.0)
Monocytes Relative: 8 %
Neutro Abs: 5.7 10*3/uL (ref 1.7–7.7)
Neutrophils Relative %: 70 %
Platelets: 413 10*3/uL — ABNORMAL HIGH (ref 150–400)
RBC: 3.01 MIL/uL — ABNORMAL LOW (ref 4.22–5.81)
RDW: 13.9 % (ref 11.5–15.5)
WBC: 8.1 10*3/uL (ref 4.0–10.5)
nRBC: 0 % (ref 0.0–0.2)

## 2022-09-13 LAB — IRON AND TIBC
Iron: 23 ug/dL — ABNORMAL LOW (ref 45–182)
Saturation Ratios: 11 % — ABNORMAL LOW (ref 17.9–39.5)
TIBC: 216 ug/dL — ABNORMAL LOW (ref 250–450)
UIBC: 193 ug/dL

## 2022-09-13 LAB — RETICULOCYTES
Immature Retic Fract: 23.5 % — ABNORMAL HIGH (ref 2.3–15.9)
RBC.: 2.99 MIL/uL — ABNORMAL LOW (ref 4.22–5.81)
Retic Count, Absolute: 78.3 10*3/uL (ref 19.0–186.0)
Retic Ct Pct: 2.6 % (ref 0.4–3.1)

## 2022-09-13 LAB — PHOSPHORUS: Phosphorus: 4.1 mg/dL (ref 2.5–4.6)

## 2022-09-13 LAB — FOLATE: Folate: 16.2 ng/mL (ref >5.9)

## 2022-09-13 LAB — FERRITIN: Ferritin: 362 ng/mL — ABNORMAL HIGH (ref 24–336)

## 2022-09-13 LAB — MAGNESIUM: Magnesium: 2.1 mg/dL (ref 1.7–2.4)

## 2022-09-13 LAB — VITAMIN B12: Vitamin B-12: 331 pg/mL (ref 180–914)

## 2022-09-13 MED ORDER — FERROUS SULFATE 325 (65 FE) MG PO TABS
325.0000 mg | ORAL_TABLET | Freq: Every day | ORAL | Status: DC
  Administered 2022-09-13 – 2022-09-14 (×2): 325 mg via ORAL
  Filled 2022-09-13 (×2): qty 1

## 2022-09-13 MED ORDER — ENOXAPARIN SODIUM 40 MG/0.4ML IJ SOSY: 40.0000 mg | PREFILLED_SYRINGE | INTRAMUSCULAR | 0 refills | Status: AC

## 2022-09-13 MED ORDER — TRAMADOL HCL 50 MG PO TABS: 25.0000 mg | ORAL_TABLET | Freq: Four times a day (QID) | ORAL | 0 refills | Status: AC | PRN

## 2022-09-13 MED ORDER — ALBUMIN HUMAN 25 % IV SOLN
50.0000 g | Freq: Once | INTRAVENOUS | Status: AC
  Administered 2022-09-13: 50 g via INTRAVENOUS
  Filled 2022-09-13: qty 200

## 2022-09-13 MED ORDER — VITAMIN C 500 MG PO TABS
500.0000 mg | ORAL_TABLET | Freq: Every day | ORAL | Status: DC
  Administered 2022-09-13 – 2022-09-14 (×2): 500 mg via ORAL
  Filled 2022-09-13 (×2): qty 1

## 2022-09-13 MED ORDER — ACETAMINOPHEN 500 MG PO TABS: 500.0000 mg | ORAL_TABLET | Freq: Four times a day (QID) | ORAL | 0 refills | Status: AC | PRN

## 2022-09-13 NOTE — Progress Notes (Signed)
Chad Malinsky Trego Sr. BJY:[id number] DOB: [birth year] DOA: 09/09/2022 PCP: Tessie Fass, MD   SubjElaina Pattee Califano Sr. is a 89 y.o. male PMHx CAD, Vascular Dementia end-stage, Hx CVA, DM type II without complication, Hx seizures, BPH  Presenting with left hip fracture.  History primarily from patient's son in the setting of end-stage dementia.  Per report, patient had a fall at facility approximately 3 to 4 days ago.  No reported head trauma loss consciousness associated with fall.  Patient minimally ambulating at baseline.  Son who is an EMT evaluated patient and noted that his left leg was shortened and internally rotation with mild bruising.  Upon evaluating patient was unable to ambulate though was noted to have some degree of ambulation with movement with other family seeing him.  No fevers or chills.  No nausea or vomiting.  No hemiparesis.  Baseline generalized confusion. Presented to the ER afebrile, hemodynamically stable.  White count 8.8, hemoglobin 10.3, creatinine 0.8.  CT head within normal limits.  Chest x-ray stable.  Left hip plain films which showed an comminuted and displaced intertrochanteric fracture.  Per Dr. Roxan Hockey in the ER, case discussed with on-call orthopedic surgeon Dr. Audelia Acton who will evaluate the patient. Review of Systems: As mentioned in the history of present illness. All other systems reviewed and are negative.   Obj: 5/13 A/O x 4, states negative left hip pain.  States that in the chair for couple hours today.    Objective: VITAL SIGNS: Temp: 97.8 F (36.6 C) (05/13 0845) BP: 90/52 (05/13 1157) Pulse Rate: 78 (05/13 1157)   VENTILATOR SETTINGS: **  Procedures/Significant Events: 5/9 s/p INTRAMEDULLARY (IM) NAIL INTERTROCHANTERIC (Left)    Consultants:  Orthopedic surgery Dr. Reinaldo Berber   Cultures   Antimicrobials:  Physical Exam:  General: A/O x 4, No acute respiratory distress, cachectic Eyes: negative scleral hemorrhage,  negative anisocoria, negative icterus ENT: Negative Runny nose, negative gingival bleeding, Neck:  Negative scars, masses, torticollis, lymphadenopathy, JVD Lungs: Clear to auscultation bilaterally without wheezes or crackles Cardiovascular: Regular rate and rhythm without murmur gallop or rub normal S1 and S2 Abdomen: negative abdominal pain, nondistended, positive soft, bowel sounds, no rebound, no ascites, no appreciable mass EXTREMITIES: LEFT hip negative tenderness to palpation, honeycomb bandages remain in place negative sign of infection Skin: Negative rashes, lesions, ulcers Psychiatric:  Negative depression, negative anxiety, negative fatigue, negative mania  Central nervous system:  Cranial nerves II through XII intact, tongue/uvula midline, all extremities muscle strength 5/5, sensation intact throughout, negative dysarthria, negative expressive aphasia, negative receptive aphasia.   .   DVT prophylaxis: Lovenox Code Status: Full Family Communication:  Status is: Inpatient    Dispo: The patient is from: Home              Anticipated d/c is to: SNF pending              Anticipated d/c date is:               Patient currently is not medically stable to d/c.      Assessment & Plan: Covid vaccination;   Principal Problem:   Hip fracture (HCC) Active Problems:   Diabetes mellitus type 2, uncomplicated (HCC)   Vascular dementia (HCC)   CAD S/P percutaneous coronary angioplasty   BPH (benign prostatic hyperplasia)   Closed displaced intertrochanteric fracture of left femur (HCC)   Malnutrition of moderate degree   Hip fracture (HCC) -Recent fall w/ Comminuted and displaced intertrochanteric fracture  on imaging -5/9 s/p INTRAMEDULLARY (IM) NAIL INTERTROCHANTERIC (Left)   -5/10 per orthopedic surgery  -Lovenox 40 mg subcu daily x 14 days at discharge  -Weightbearing as tolerated left lower extremity  -Patient can begin showering/bathing in 3 days with honeycomb  dressing.   -Patient can remove honeycomb dressing 1 week postop and continue showering/bathing with no  Dressing -5/11 out of bed to chair every shift, WBOT QID -5/13 patient stable for discharge, awaiting PASARR paperwork    BPH (benign prostatic hyperplasia) -Finasteride 5 mg daily -5/11 Foley removed: Voiding trial   CAD S/P percutaneous coronary angioplasty/Hypotension -Hold Imdur secondary to patient's relative hypotension - Midodrine 10 mg TID -5/13 Albumin 50 g x 1   Vascular dementia end-stage (HCC) -Baseline (at baseline not oriented to person, place, time) -Benazepril 10 mg daily - Lamictal 200 mg daily - Seroquel 25 mg BID -Zoloft 100 mg daily  Diabetes mellitus type 2, uncomplicated (HCC) -5/10 hemoglobin A1c= 5.8 -5/10 sensitive SSI  Anemia chronic disease/iron deficiency anemia. - 5/12 possibly acute blood loss secondary to surgery, will obtain anemia panel. - Transfuse for hemoglobin<7 Lab Results  Component Value Date   HGB 9.0 (L) 09/13/2022   HGB 8.7 (L) 09/12/2022   HGB 8.5 (L) 09/11/2022   HGB 8.9 (L) 09/10/2022   HGB 10.3 (L) 09/09/2022  -5/13 iron sulfate 325 mg daily - 5/13 vitamin C 500 mg daily  Malnutrition of moderate degree - Encourage patient to continue eating meals.  Hypocalcemia -Calcium goal> 8.9 -5/10 corrected calcium= 9.1 no action required  Goals of care - 5/11 PASRR pending for SNF placement     Mobility Assessment (last 72 hours)     Mobility Assessment     Row Name 09/13/22 1223 09/12/22 2150 09/12/22 2057 09/12/22 1046 09/11/22 2110   Does patient have an order for bedrest or is patient medically unstable -- -- No - Continue assessment -- No - Continue assessment   What is the highest level of mobility based on the progressive mobility assessment? Level 1 (Bedfast) - Unable to balance while sitting on edge of bed Level 1 (Bedfast) - Unable to balance while sitting on edge of bed Level 1 (Bedfast) - Unable to balance  while sitting on edge of bed Level 1 (Bedfast) - Unable to balance while sitting on edge of bed Level 1 (Bedfast) - Unable to balance while sitting on edge of bed   Is the above level different from baseline mobility prior to current illness? -- -- Yes - Recommend PT order -- Yes - Recommend PT order    Row Name 09/11/22 1029 09/10/22 2029         Does patient have an order for bedrest or is patient medically unstable -- No - Continue assessment      What is the highest level of mobility based on the progressive mobility assessment? Level 1 (Bedfast) - Unable to balance while sitting on edge of bed Level 1 (Bedfast) - Unable to balance while sitting on edge of bed      Is the above level different from baseline mobility prior to current illness? -- Yes - Recommend PT order                    Time: 50 minutes         Care during the described time interval was provided by me .  I have reviewed this patient's available data, including medical history, events of note, physical examination, and all  test results as part of my evaluation.

## 2022-09-13 NOTE — Progress Notes (Addendum)
   Subjective: 4 Days Post-Op Procedure(s) (LRB): INTRAMEDULLARY (IM) NAIL INTERTROCHANTERIC (Left) Patient reports pain as mild.   Patient is well, and has had no acute complaints or problems Denies any CP, SOB, ABD pain. We will continue with physical therapy today.  Plan is to go Skilled nursing facility after hospital stay.  Objective: Vital signs in last 24 hours: Temp:  [98.2 F (36.8 C)-98.4 F (36.9 C)] 98.2 F (36.8 C) (05/13 0047) Pulse Rate:  [62-72] 72 (05/13 0117) Resp:  [16-17] 16 (05/13 0047) BP: (94-108)/(43-57) 108/57 (05/13 0117) SpO2:  [93 %-94 %] 93 % (05/13 0117)  Intake/Output from previous day: 05/12 0701 - 05/13 0700 In: 120 [P.O.:120] Out: -  Intake/Output this shift: No intake/output data recorded.  Recent Labs    09/11/22 0607 09/12/22 0530 09/13/22 0438  HGB 8.5* 8.7* 9.0*   Recent Labs    09/12/22 0530 09/13/22 0438  WBC 10.9* 8.1  RBC 2.92* 3.01*  2.99*  HCT 27.3* 28.5*  PLT 367 413*   Recent Labs    09/12/22 0530 09/13/22 0438  NA 132* 137  K 4.1 4.5  CL 99 103  CO2 25 30  BUN 22 28*  CREATININE 0.83 0.91  GLUCOSE 107* 97  CALCIUM 8.1* 8.5*   No results for input(s): "LABPT", "INR" in the last 72 hours.  EXAM General - Patient is Alert, Appropriate, and Confused Extremity - Neurovascular intact Sensation intact distally Intact pulses distally Dorsiflexion/Plantar flexion intact No cellulitis present Compartment soft Mild ecchymosis, imoproving Dressing - dressing C/D/I and no drainage Motor Function - intact, moving foot and toes well on exam.   Past Medical History:  Diagnosis Date   Coronary artery disease    Depression    Diabetes mellitus without complication (HCC)    Seizures (HCC)    Stroke (HCC)    Vascular dementia (HCC)     Assessment/Plan:   4 Days Post-Op Procedure(s) (LRB): INTRAMEDULLARY (IM) NAIL INTERTROCHANTERIC (Left) Principal Problem:   Hip fracture (HCC) Active Problems:    Diabetes mellitus type 2, uncomplicated (HCC)   Vascular dementia (HCC)   CAD S/P percutaneous coronary angioplasty   BPH (benign prostatic hyperplasia)   Closed displaced intertrochanteric fracture of left femur (HCC)   Malnutrition of moderate degree  Estimated body mass index is 18.23 kg/m as calculated from the following:   Height as of this encounter: 5\' 9"  (1.753 m).   Weight as of this encounter: 56 kg. Advance diet Up with therapy  Vital signs are stable, BP soft.  Patient asymptomatic.   Pain well-controlled  Acute on chronic postop blood loss anemia -hemoglobin 9.0, stable and trending up.    Care management to assist with discharge to skilled nursing facility.   Patient will need 2-week follow-up with [facility] orthopedics for staple removal and x-rays. Lovenox 40 mg subcu daily x 14 days at discharge Weightbearing as tolerated left lower extremity Patient can remove honeycomb dressing 1 week postop and continue showering/bathing with no dressing   DVT Prophylaxis - Lovenox, TED hose, and SCDs Weight-Bearing as tolerated to left leg   T. Cranston Neighbor, PA-C [facility] 09/13/2022, 8:25 AM

## 2022-09-13 NOTE — TOC Progression Note (Signed)
Transition of Care (TOC) - Progression Note    Patient Details  Name: Chad Bushey Grissinger Sr. MRN: [id number] Date of Birth: 1934/04/18  Transition of Care [facility]) CM/SW Contact  Marlowe Sax, RN Phone Number: 09/13/2022, 2:03 PM  Clinical Narrative:   PASSR number [id number]    Expected Discharge Plan: Skilled Nursing Facility Barriers to Discharge: Continued Medical Work up  Expected Discharge Plan and Services       Living arrangements for the past 2 months: Assisted Living Facility                                       Social Determinants of Health (SDOH) Interventions SDOH Screenings   Food Insecurity: No Food Insecurity (09/09/2022)  Housing: Low Risk  (09/09/2022)  Transportation Needs: No Transportation Needs (09/09/2022)  Utilities: Not At Risk (09/09/2022)  Tobacco Use: Medium Risk (09/10/2022)    Readmission Risk Interventions     No data to display

## 2022-09-13 NOTE — Care Management Important Message (Signed)
Important Message  Patient Details  Name: Chad BRIDEWELL Sr. MRN: [id number] Date of Birth: 1934-02-27   Medicare Important Message Given:  N/A - LOS <3 / Initial given by admissions     Tannor Pyon A Martia Dalby 09/13/2022, 9:03 AM

## 2022-09-13 NOTE — Progress Notes (Signed)
Physical Therapy Treatment Patient Details Name: Chad MANDIA Sr. MRN: [id number] DOB: 1933-10-29 Today's Date: 09/13/2022   History of Present Illness Chad [name] is an 44yoM who [facility] after a fall at facility, subsequent pain and difficulty tolerating baseline mobility. Imaging revealing of Lt hip fracture, pt now s/p IM nail fixation anfd WBAT. PMH: vascular dementia, CAD, DM2, chronic orthostatic hypotension on midodrine, frequent falls, remote CVA c LUE hemiplegia. Per son, pt has been mostly WC transfers over past few months, only minimal walking, most of which has been with PT at [facility] in Feb. Pt recently transitioned from home with wife to ALF at [facility] about 1-2 weeks prior to this admission. Son reports pt has had a hard time avoiding deconditioning at home this year.    PT Comments    Pt pulled up in bed to assist with feeding.  Assisted pt with peaches and 2 orange juices.  BLE PROM x 10.  +2 assist for transition to EOB.  Pt with increased resistance today to sitting with more post pushing.  Despite assist and cues, pt pushing back causing hips to slide forward to EOB.  Assisted pt back to bed as he is very vocal about returning to supine.  Assisted and positioned with max a x 2 for comfort.   Recommendations for follow up therapy are one component of a multi-disciplinary discharge planning process, led by the attending physician.  Recommendations may be updated based on patient status, additional functional criteria and insurance authorization.  Follow Up Recommendations       Assistance Recommended at Discharge Intermittent Supervision/Assistance  Patient can return home with the following Two people to help with walking and/or transfers;Two people to help with bathing/dressing/bathroom;Direct supervision/assist for financial management;Direct supervision/assist for medications management;Help with stairs or ramp for entrance;Assist for  transportation;Assistance with cooking/housework   Equipment Recommendations       Recommendations for Other Services       Precautions / Restrictions Precautions Precautions: Fall Precaution Comments: chronic orthostatic hypotnesion without symptoms due to dementia, takes midodrine at baseline Restrictions LLE Weight Bearing: Weight bearing as tolerated     Mobility  Bed Mobility Overal bed mobility: Needs Assistance Bed Mobility: Supine to Sit, Sit to Supine     Supine to sit: Max assist, +2 for physical assistance Sit to supine: Max assist, +2 for physical assistance   General bed mobility comments: resists more today transition to sitting and remaining upright.    Transfers                   General transfer comment: deferred due to needing significant assist for sitting balance    Ambulation/Gait               General Gait Details: not appropriate to attempt   Stairs             Wheelchair Mobility    Modified Rankin (Stroke Patients Only)       Balance Overall balance assessment: Needs assistance Sitting-balance support: Feet supported Sitting balance-Leahy Scale: Zero Sitting balance - Comments: resists today pushing backwards with no attempt to remain upright.     Standing balance-Leahy Scale: Zero Standing balance comment: anticiapte dependant assist for standing attempts given sitting balance quality                            Cognition Arousal/Alertness: Awake/alert Behavior During Therapy: WFL for tasks assessed/performed  Overall Cognitive Status: Within Functional Limits for tasks assessed                                          Exercises Other Exercises Other Exercises: BLE PROM x 10    General Comments        Pertinent Vitals/Pain Pain Assessment Pain Assessment: Faces Faces Pain Scale: Hurts whole lot Pain Location: Left hip while seated and with transitions Pain Descriptors  / Indicators: Sore Pain Intervention(s): Limited activity within patient's tolerance, Monitored during session, Repositioned    Home Living                          Prior Function            PT Goals (current goals can now be found in the care plan section) Progress towards PT goals: Progressing toward goals    Frequency    7X/week      PT Plan Current plan remains appropriate    Co-evaluation              AM-PAC PT "6 Clicks" Mobility   Outcome Measure  Help needed turning from your back to your side while in a flat bed without using bedrails?: Total Help needed moving from lying on your back to sitting on the side of a flat bed without using bedrails?: Total Help needed moving to and from a bed to a chair (including a wheelchair)?: Total Help needed standing up from a chair using your arms (e.g., wheelchair or bedside chair)?: Total Help needed to walk in hospital room?: Total Help needed climbing 3-5 steps with a railing? : Total 6 Click Score: 6    End of Session   Activity Tolerance: Patient limited by pain;Patient limited by fatigue Patient left: in bed;with call bell/phone within reach;with bed alarm set Nurse Communication: Mobility status PT Visit Diagnosis: History of falling (Z91.81);Muscle weakness (generalized) (M62.81)     Time: 8119-1478 PT Time Calculation (min) (ACUTE ONLY): 18 min  Charges:  $Therapeutic Exercise: 8-22 mins                   Danielle Dess, PTA 09/13/22, 12:36 PM

## 2022-09-13 NOTE — Plan of Care (Signed)
  Problem: Activity: Goal: Ability to ambulate and perform ADLs will improve Outcome: Progressing   Problem: Clinical Measurements: Goal: Postoperative complications will be avoided or minimized Outcome: Progressing   Problem: Pain Management: Goal: Pain level will decrease Outcome: Progressing   Problem: Fluid Volume: Goal: Ability to maintain a balanced intake and output will improve Outcome: Progressing   Problem: Metabolic: Goal: Ability to maintain appropriate glucose levels will improve Outcome: Progressing   Problem: Nutritional: Goal: Maintenance of adequate nutrition will improve Outcome: Progressing Goal: Progress toward achieving an optimal weight will improve Outcome: Progressing   Problem: Skin Integrity: Goal: Risk for impaired skin integrity will decrease Outcome: Progressing

## 2022-09-13 NOTE — TOC Progression Note (Signed)
Transition of Care (TOC) - Progression Note    Patient Details  Name: Chad Buchko Ferrero Sr. MRN: [id number] Date of Birth: [birth year]  Transition of Care [facility]) CM/SW Contact  Marlowe Sax, RN Phone Number: 09/13/2022, 10:52 AM  Clinical Narrative:    PASSR pending, Uploaded Clinical documents requested to [facility] MUST   Expected Discharge Plan: Skilled Nursing Facility Barriers to Discharge: Continued Medical Work up  Expected Discharge Plan and Services       Living arrangements for the past 2 months: Assisted Living Facility                                       Social Determinants of Health (SDOH) Interventions SDOH Screenings   Food Insecurity: No Food Insecurity (09/09/2022)  Housing: Low Risk  (09/09/2022)  Transportation Needs: No Transportation Needs (09/09/2022)  Utilities: Not At Risk (09/09/2022)  Tobacco Use: Medium Risk (09/10/2022)    Readmission Risk Interventions     No data to display

## 2022-09-13 NOTE — Progress Notes (Signed)
The above named patient is recommended to go to Short Term Rehab for strengthening and gait training for balance.  It is expected that the Short Term Rehab stay will be less than 30 days.  The patient is expected to return home after Rehab.  

## 2022-09-14 LAB — COMPREHENSIVE METABOLIC PANEL
ALT: 17 U/L (ref 0–44)
AST: 28 U/L (ref 15–41)
Albumin: 3.3 g/dL — ABNORMAL LOW (ref 3.5–5.0)
Alkaline Phosphatase: 80 U/L (ref 38–126)
Anion gap: 5 (ref 5–15)
BUN: 35 mg/dL — ABNORMAL HIGH (ref 8–23)
CO2: 30 mmol/L (ref 22–32)
Calcium: 8.6 mg/dL — ABNORMAL LOW (ref 8.9–10.3)
Chloride: 103 mmol/L (ref 98–111)
Creatinine, Ser: 0.93 mg/dL (ref 0.61–1.24)
GFR, Estimated: >60 mL/min (ref >60)
Glucose, Bld: 112 mg/dL — ABNORMAL HIGH (ref 70–99)
Potassium: 4.2 mmol/L (ref 3.5–5.1)
Sodium: 138 mmol/L (ref 135–145)
Total Bilirubin: 1.1 mg/dL (ref 0.3–1.2)
Total Protein: 5.6 g/dL — ABNORMAL LOW (ref 6.5–8.1)

## 2022-09-14 LAB — CBC WITH DIFFERENTIAL/PLATELET
Abs Immature Granulocytes: 0.06 10*3/uL (ref 0.00–0.07)
Basophils Absolute: 0.1 10*3/uL (ref 0.0–0.1)
Basophils Relative: 1 %
Eosinophils Absolute: 0.3 10*3/uL (ref 0.0–0.5)
Eosinophils Relative: 4 %
HCT: 25.3 % — ABNORMAL LOW (ref 39.0–52.0)
Hemoglobin: 8.1 g/dL — ABNORMAL LOW (ref 13.0–17.0)
Immature Granulocytes: 1 %
Lymphocytes Relative: 13 %
Lymphs Abs: 1.1 10*3/uL (ref 0.7–4.0)
MCH: 30.3 pg (ref 26.0–34.0)
MCHC: 32 g/dL (ref 30.0–36.0)
MCV: 94.8 fL (ref 80.0–100.0)
Monocytes Absolute: 0.7 10*3/uL (ref 0.1–1.0)
Monocytes Relative: 9 %
Neutro Abs: 5.8 10*3/uL (ref 1.7–7.7)
Neutrophils Relative %: 72 %
Platelets: 391 10*3/uL (ref 150–400)
RBC: 2.67 MIL/uL — ABNORMAL LOW (ref 4.22–5.81)
RDW: 13.6 % (ref 11.5–15.5)
WBC: 8 10*3/uL (ref 4.0–10.5)
nRBC: 0 % (ref 0.0–0.2)

## 2022-09-14 LAB — GLUCOSE, CAPILLARY
Glucose-Capillary: 107 mg/dL — ABNORMAL HIGH (ref 70–99)
Glucose-Capillary: 111 mg/dL — ABNORMAL HIGH (ref 70–99)
Glucose-Capillary: 130 mg/dL — ABNORMAL HIGH (ref 70–99)
Glucose-Capillary: 104 mg/dL — ABNORMAL HIGH (ref 70–99)
Glucose-Capillary: 123 mg/dL — ABNORMAL HIGH (ref 70–99)

## 2022-09-14 LAB — MAGNESIUM: Magnesium: 2.3 mg/dL (ref 1.7–2.4)

## 2022-09-14 LAB — PHOSPHORUS: Phosphorus: 2.8 mg/dL (ref 2.5–4.6)

## 2022-09-14 MED ORDER — METOCLOPRAMIDE HCL 5 MG PO TABS: 5.0000 mg | ORAL_TABLET | Freq: Three times a day (TID) | ORAL | 0 refills | Status: AC | PRN

## 2022-09-14 MED ORDER — MIDODRINE HCL 10 MG PO TABS: 10.0000 mg | ORAL_TABLET | Freq: Three times a day (TID) | ORAL | 0 refills | Status: AC

## 2022-09-14 MED ORDER — MENTHOL 3 MG MT LOZG: 1.0000 | LOZENGE | OROMUCOSAL | 0 refills | Status: AC | PRN

## 2022-09-14 MED ORDER — DOCUSATE SODIUM 100 MG PO CAPS: 100.0000 mg | ORAL_CAPSULE | Freq: Two times a day (BID) | ORAL | 0 refills | Status: AC

## 2022-09-14 MED ORDER — ADULT MULTIVITAMIN W/MINERALS CH: 1.0000 | ORAL_TABLET | Freq: Every day | ORAL | 0 refills | Status: AC

## 2022-09-14 MED ORDER — ASCORBIC ACID 500 MG PO TABS: 500.0000 mg | ORAL_TABLET | Freq: Every day | ORAL | 0 refills | Status: AC

## 2022-09-14 MED ORDER — ONDANSETRON HCL 4 MG PO TABS: 4.0000 mg | ORAL_TABLET | Freq: Four times a day (QID) | ORAL | 0 refills | Status: AC | PRN

## 2022-09-14 MED ORDER — FERROUS SULFATE 325 (65 FE) MG PO TABS: 325.0000 mg | ORAL_TABLET | Freq: Every day | ORAL | 0 refills | Status: AC

## 2022-09-14 MED ORDER — FINASTERIDE 5 MG PO TABS: 5.0000 mg | ORAL_TABLET | Freq: Every day | ORAL | 0 refills | Status: AC

## 2022-09-14 MED ORDER — ENSURE ENLIVE PO LIQD: 237.0000 mL | Freq: Two times a day (BID) | ORAL | 0 refills | Status: AC

## 2022-09-14 NOTE — Care Management Important Message (Signed)
Important Message  Patient Details  Name: Chad ROBERG Sr. MRN: [id number] Date of Birth: May 05, 1933   Medicare Important Message Given:  Yes     Olegario Messier A Sacora Hawbaker 09/14/2022, 2:26 PM

## 2022-09-14 NOTE — Discharge Summary (Signed)
Physician Discharge Summary  Chad [name]. ZOX:[id number] DOB: 1933/11/27 DOA: 09/09/2022  PCP: Tessie Fass, MD  Admit date: 09/09/2022 Discharge date: 09/14/2022  Time spent: 35 minutes  Recommendations for Outpatient Follow-up: Hip fracture [facility]) -Recent fall w/ Comminuted and displaced intertrochanteric fracture on imaging -5/9 s/p INTRAMEDULLARY (IM) NAIL INTERTROCHANTERIC (Left)   -5/10 per orthopedic surgery             -Lovenox 40 mg subcu daily x 14 days at discharge             -Weightbearing as tolerated left lower extremity             -Patient can begin showering/bathing in 3 days with honeycomb dressing.   -Patient can remove honeycomb dressing 1 week postop and continue showering/bathing with no  Dressing -5/11 out of bed to chair every shift, WBOT QID -5/13 patient stable for discharge, awaiting PASARR paperwork -Follow-up in 2-week with [facility]opedics for staple removal and x-rays, with Dr. Reinaldo Berber.      BPH (benign prostatic hyperplasia) -Finasteride 5 mg daily -5/11 Foley removed: Voiding trial - Urinary retention resolved   CAD S/P percutaneous coronary angioplasty/Hypotension -Hold Imdur secondary to patient's relative hypotension - Midodrine 10 mg TID -5/13 Albumin 50 g x 1   Vascular dementia end-stage (HCC) -Baseline (at baseline not oriented to person, place, time) -Benazepril 10 mg daily - Lamictal 200 mg daily - Seroquel 25 mg BID -Zoloft 100 mg daily   Diabetes mellitus type 2, uncomplicated (HCC) -5/10 hemoglobin A1c= 5.8 -5/10 sensitive SSI   Anemia chronic disease/iron deficiency anemia. - 5/12 possibly acute blood loss secondary to surgery, will obtain anemia panel. - Transfuse for hemoglobin<7 Lab Results  Component Value Date   HGB 8.1 (L) 09/14/2022   HGB 9.0 (L) 09/13/2022   HGB 8.7 (L) 09/12/2022   HGB 8.5 (L) 09/11/2022   HGB 8.9 (L) 09/10/2022  -5/13 iron sulfate 325 mg daily - 5/13 vitamin C 500 mg  daily   Malnutrition of moderate degree - Encourage patient to continue eating meals.   Hypocalcemia -Calcium goal> 8.9 -5/10 corrected calcium= 9.1 no action required    Discharge Diagnoses:  Principal Problem:   Hip fracture (HCC) Active Problems:   Diabetes mellitus type 2, uncomplicated (HCC)   Vascular dementia (HCC)   CAD S/P percutaneous coronary angioplasty   BPH (benign prostatic hyperplasia)   Closed displaced intertrochanteric fracture of left femur (HCC)   Malnutrition of moderate degree   Discharge Condition: Stable  Diet recommendation: Regular  Filed Weights   09/09/22 1027  Weight: 56 kg    History of present illness:  Chad J Mikkelson [name]. is a 89 y.o. male PMHx CAD, Vascular Dementia end-stage, Hx CVA, DM type II without complication, Hx seizures, BPH   Presenting with left hip fracture.  History primarily from patient's son in the setting of end-stage dementia.  Per report, patient had a fall at facility approximately 3 to 4 days ago.  No reported head trauma loss consciousness associated with fall.  Patient minimally ambulating at baseline.  Son who is an EMT evaluated patient and noted that his left leg was shortened and internally rotation with mild bruising.  Upon evaluating patient was unable to ambulate though was noted to have some degree of ambulation with movement with other family seeing him.  No fevers or chills.  No nausea or vomiting.  No hemiparesis.  Baseline generalized confusion. Presented to the ER afebrile, hemodynamically stable.  White count 8.8, hemoglobin 10.3, creatinine 0.8.  CT head within normal limits.  Chest x-ray stable.  Left hip plain films which showed an comminuted and displaced intertrochanteric fracture.  Per Dr. Roxan Hockey in the ER, case discussed with on-call orthopedic surgeon Dr. Audelia Acton who will evaluate the patient.  Hospital Course:  See above    Procedures/Significant Events: 5/9 s/p INTRAMEDULLARY (IM) NAIL  INTERTROCHANTERIC (Left)      Consultants:  Orthopedic surgery Dr. Reinaldo Berber    Discharge Exam: Vitals:   09/13/22 1157 09/13/22 1729 09/13/22 2338 09/14/22 0831  BP: (!) 90/52 (!) 95/48 (!) 103/56 (!) 97/54  Pulse: 78 93 81   Resp: 18 18 20 16   Temp:   98.2 F (36.8 C) 98 F (36.7 C)  TempSrc:    Oral  SpO2:  94% 100% 96%  Weight:      Height:        General: A/O x 4, No acute respiratory distress, cachectic Eyes: negative scleral hemorrhage, negative anisocoria, negative icterus ENT: Negative Runny nose, negative gingival bleeding, Neck:  Negative scars, masses, torticollis, lymphadenopathy, JVD Lungs: Clear to auscultation bilaterally without wheezes or crackles Cardiovascular: Regular rate and rhythm without murmur gallop or rub normal S1 and S2  Discharge Instructions   Allergies as of 09/14/2022   No Known Allergies      Medication List     STOP taking these medications    isosorbide mononitrate 30 MG 24 hr tablet Commonly known as: IMDUR       TAKE these medications    acetaminophen 500 MG tablet Commonly known as: TYLENOL Take 1 tablet (500 mg total) by mouth every 6 (six) hours as needed for moderate pain or mild pain.   ascorbic acid 500 MG tablet Commonly known as: VITAMIN C Take 1 tablet (500 mg total) by mouth daily. Start taking on: Sep 15, 2022   aspirin 81 MG chewable tablet Chew 81 mg by mouth daily.   atorvastatin 40 MG tablet Commonly known as: LIPITOR Take 40 mg by mouth daily.   cholecalciferol 25 MCG (1000 UNIT) tablet Commonly known as: VITAMIN D3 Take 2,000 Units by mouth daily.   docusate sodium 100 MG capsule Commonly known as: COLACE Take 1 capsule (100 mg total) by mouth 2 (two) times daily.   donepezil 10 MG tablet Commonly known as: ARICEPT Take 10 mg by mouth at bedtime.   enoxaparin 40 MG/0.4ML injection Commonly known as: LOVENOX Inject 0.4 mLs (40 mg total) into the skin daily for 14 days.    feeding supplement Liqd Take 237 mLs by mouth 2 (two) times daily between meals. Start taking on: Sep 15, 2022   ferrous sulfate 325 (65 FE) MG tablet Take 1 tablet (325 mg total) by mouth daily with breakfast. Start taking on: Sep 15, 2022   finasteride 5 MG tablet Commonly known as: PROSCAR Take 1 tablet (5 mg total) by mouth daily. Start taking on: Sep 15, 2022   lamoTRIgine 200 MG tablet Commonly known as: LAMICTAL Take 200 mg by mouth daily.   menthol-cetylpyridinium 3 MG lozenge Commonly known as: CEPACOL Take 1 lozenge (3 mg total) by mouth as needed for sore throat.   metoCLOPramide 5 MG tablet Commonly known as: REGLAN Take 1-2 tablets (5-10 mg total) by mouth every 8 (eight) hours as needed for nausea (if ondansetron (ZOFRAN) ineffective.).   midodrine 10 MG tablet Commonly known as: PROAMATINE Take 1 tablet (10 mg total) by mouth with breakfast, with lunch, and  with evening meal. What changed: when to take this   multivitamin with minerals Tabs tablet Take 1 tablet by mouth daily. Start taking on: Sep 15, 2022   ondansetron 4 MG tablet Commonly known as: ZOFRAN Take 1 tablet (4 mg total) by mouth every 6 (six) hours as needed for nausea.   QUEtiapine 25 MG tablet Commonly known as: SEROQUEL Take 25 mg by mouth 2 (two) times daily.   sertraline 100 MG tablet Commonly known as: ZOLOFT Take 100 mg by mouth daily.   traMADol 50 MG tablet Commonly known as: ULTRAM Take 0.5-1 tablets (25-50 mg total) by mouth every 6 (six) hours as needed for moderate pain. What changed:  how much to take when to take this       No Known Allergies  Follow-up Information     Evon Slack, PA-C Follow up in 2 week(s).   Specialties: Orthopedic Surgery, Emergency Medicine Contact information: [address] [place] [address] [phone]         Cooner, Ruben Gottron, MD. Schedule an appointment as soon as possible for a visit in 2 week(s).    Specialty: Internal Medicine Contact information: [address] [place] [address] [phone]                  The results of significant diagnostics from this hospitalization (including imaging, microbiology, ancillary and laboratory) are listed below for reference.    Significant Diagnostic Studies: DG HIP UNILAT WITH PELVIS 2-3 VIEWS LEFT  Result Date: 09/09/2022 CLINICAL DATA:  Hip surgery EXAM: DG HIP (WITH OR WITHOUT PELVIS) 2-3V LEFT COMPARISON:  09/09/2022 FINDINGS: Four low resolution intraoperative spot views of the left hip. Total fluoroscopy time was 2 minutes 8 seconds, fluoroscopic dose of 19.96 mGy. The images demonstrate intramedullary rodding and screw fixation of comminuted left intertrochanteric fracture IMPRESSION: Intraoperative fluoroscopic assistance provided during left hip surgery. Electronically Signed   By: Jasmine Pang M.D.   On: 09/09/2022 15:43   DG C-Arm 1-60 Min-No Report  Result Date: 09/09/2022 Fluoroscopy was utilized by the requesting physician.  No radiographic interpretation.   DG C-Arm 1-60 Min-No Report  Result Date: 09/09/2022 Fluoroscopy was utilized by the requesting physician.  No radiographic interpretation.   DG Chest Portable 1 View  Result Date: 09/09/2022 CLINICAL DATA:  Preoperative respiratory evaluation. EXAM: PORTABLE CHEST 1 VIEW COMPARISON:  None Available. FINDINGS: The lungs are clear without focal pneumonia, edema, pneumothorax or pleural effusion. Interstitial markings are diffusely coarsened with chronic features. The cardiopericardial silhouette is within normal limits for size. Status post CABG. Bones are diffusely demineralized.The heart size and mediastinal contours are within normal limits. Both lungs are clear. The visualized skeletal structures are unremarkable. IMPRESSION: No active disease. Electronically Signed   By: Kennith Center M.D.   On: 09/09/2022 12:32   DG FEMUR MIN 2 VIEWS LEFT  Result  Date: 09/09/2022 CLINICAL DATA:  Fall with injury to left hip area. EXAM: LEFT FEMUR 2 VIEWS COMPARISON:  CT abdomen/pelvis 08/31/2022. FINDINGS: There is a comminuted and mildly proximally displaced intertrochanteric fracture. There is approximately 1.3 cm medial displacement of the lesser trochanter. Femoroacetabular alignment is maintained. The symphysis pubis is maintained. Extensive vascular calcifications are noted. IMPRESSION: Comminuted and displaced intertrochanteric fracture as above. Electronically Signed   By: Lesia Hausen M.D.   On: 09/09/2022 12:30   CT HEAD WO CONTRAST ( )  Result Date: 09/09/2022 CLINICAL DATA:  Fall yesterday. EXAM: CT HEAD WITHOUT CONTRAST TECHNIQUE:  Contiguous axial images were obtained from the base of the skull through the vertex without intravenous contrast. RADIATION DOSE REDUCTION: This exam was performed according to the departmental dose-optimization program which includes automated exposure control, adjustment of the mA and/or kV according to patient size and/or use of iterative reconstruction technique. COMPARISON:  CT head 08/31/2022. FINDINGS: Brain: There is no acute intracranial hemorrhage, extra-axial fluid collection, or acute infarct. Remote infarcts in the right MCA distribution with ex vacuo dilatation of the right lateral ventricle are unchanged. The ventricles are stable in size. Gray-white differentiation is otherwise preserved. Additional hypodensity in the supratentorial white matter consistent with underlying chronic small-vessel ischemic change is stable. The pituitary and suprasellar region are normal. There is no mass lesion. There is no mass effect or midline shift. Vascular: There is calcification of the bilateral carotid siphons and vertebral arteries. Skull: Normal. Negative for fracture or focal lesion. Sinuses/Orbits: The imaged paranasal sinuses are clear. The mastoid air cells are clear. Bilateral lens implants are in place. The globes and  orbits are otherwise unremarkable. Other: None. IMPRESSION: Stable noncontrast head CT with no acute intracranial pathology. Electronically Signed   By: Lesia Hausen M.D.   On: 09/09/2022 12:04   CT ABDOMEN PELVIS W CONTRAST  Result Date: 08/31/2022 CLINICAL DATA:  Acute generalized abdominal pain. EXAM: CT ABDOMEN AND PELVIS WITH CONTRAST TECHNIQUE: Multidetector CT imaging of the abdomen and pelvis was performed using the standard protocol following bolus administration of intravenous contrast. Exam is somewhat limited due to persistent patient motion artifact. RADIATION DOSE REDUCTION: This exam was performed according to the departmental dose-optimization program which includes automated exposure control, adjustment of the mA and/or kV according to patient size and/or use of iterative reconstruction technique. CONTRAST:  OMNIPAQUE IOHEXOL 300 MG/ML  SOLN COMPARISON:  None Available. FINDINGS: Lower chest: No acute abnormality. Hepatobiliary: No focal liver abnormality is seen. No gallstones, gallbladder wall thickening, or biliary dilatation. Pancreas: Unremarkable. No pancreatic ductal dilatation or surrounding inflammatory changes. Spleen: Normal in size without focal abnormality. Adrenals/Urinary Tract: Adrenal glands appear normal. Two right renal cysts are noted for which no further follow-up is required. No hydronephrosis or renal obstruction is noted. Urinary bladder is decompressed. Stomach/Bowel: Stomach is unremarkable. Large amount of stool is noted in the rectum concerning for impaction. No abnormal bowel inflammation is noted. No small bowel dilatation is noted. The appendix is not clearly visualized. Vascular/Lymphatic: Aortic atherosclerosis. No enlarged abdominal or pelvic lymph nodes. Reproductive: Prostate is unremarkable. Other: No abdominal wall hernia or abnormality. No abdominopelvic ascites. Musculoskeletal: No acute or significant osseous findings. IMPRESSION: Large amount of  stool noted in rectum concerning for impaction. No acute abnormality seen in the abdomen or pelvis. Aortic Atherosclerosis (ICD10-I70.0). Electronically Signed   By: Lupita Raider M.D.   On: 08/31/2022 08:49   DG Chest Portable 1 View  Result Date: 08/31/2022 CLINICAL DATA:  89 year old male with history of weakness. EXAM: PORTABLE CHEST 1 VIEW COMPARISON:  Chest x-ray 06/23/2021. FINDINGS: Lung volumes are normal. No consolidative airspace disease. No pleural effusions. No pneumothorax. No pulmonary nodule or mass noted. Pulmonary vasculature and the cardiomediastinal silhouette are within normal limits. Status post median sternotomy for CABG. IMPRESSION: 1.  No radiographic evidence of acute cardiopulmonary disease. Electronically Signed   By: Trudie Reed M.D.   On: 08/31/2022 07:22   CT Head Wo Contrast  Result Date: 08/31/2022 CLINICAL DATA:  Head trauma EXAM: CT HEAD WITHOUT CONTRAST CT CERVICAL SPINE WITHOUT CONTRAST TECHNIQUE: Multidetector  CT imaging of the head and cervical spine was performed following the standard protocol without intravenous contrast. Multiplanar CT image reconstructions of the cervical spine were also generated. RADIATION DOSE REDUCTION: This exam was performed according to the departmental dose-optimization program which includes automated exposure control, adjustment of the mA and/or kV according to patient size and/or use of iterative reconstruction technique. COMPARISON:  06/23/2021 FINDINGS: CT HEAD FINDINGS Brain: No acute territorial infarction, hemorrhage or intracranial mass. Atrophy and chronic small vessel ischemic changes of the white matter. Encephalomalacia within the right frontal and parietal lobes with involvement of right insula consistent with chronic right MCA infarcts. Stable ventricle size. Vascular: No hyperdense vessels. Vertebral and carotid vascular calcification Skull: Normal. Negative for fracture or focal lesion. Sinuses/Orbits: Postsurgical  changes of the maxillary sinuses. Mild mucosal thickening Other: None CT CERVICAL SPINE FINDINGS Alignment: Straightening of the cervical spine. No subluxation. Facet alignment is within normal limits. Skull base and vertebrae: No acute fracture. No primary bone lesion or focal pathologic process. Soft tissues and spinal canal: No prevertebral fluid or swelling. No visible canal hematoma. Disc levels: Multilevel degenerative changes. Advanced disc space narrowing C4 through C7. Facet degenerative changes at multiple levels with multiple level foraminal stenosis. Advanced C1-C2 degenerative change Upper chest: Negative. Other: None IMPRESSION: No CT evidence for acute intracranial abnormality. Atrophy and chronic small vessel ischemic changes of the white matter. Chronic right MCA infarcts. Straightening of the cervical spine with degenerative changes. No acute osseous abnormality. Electronically Signed   By: Jasmine Pang M.D.   On: 08/31/2022 03:19   CT Cervical Spine Wo Contrast  Result Date: 08/31/2022 CLINICAL DATA:  Head trauma EXAM: CT HEAD WITHOUT CONTRAST CT CERVICAL SPINE WITHOUT CONTRAST TECHNIQUE: Multidetector CT imaging of the head and cervical spine was performed following the standard protocol without intravenous contrast. Multiplanar CT image reconstructions of the cervical spine were also generated. RADIATION DOSE REDUCTION: This exam was performed according to the departmental dose-optimization program which includes automated exposure control, adjustment of the mA and/or kV according to patient size and/or use of iterative reconstruction technique. COMPARISON:  06/23/2021 FINDINGS: CT HEAD FINDINGS Brain: No acute territorial infarction, hemorrhage or intracranial mass. Atrophy and chronic small vessel ischemic changes of the white matter. Encephalomalacia within the right frontal and parietal lobes with involvement of right insula consistent with chronic right MCA infarcts. Stable ventricle  size. Vascular: No hyperdense vessels. Vertebral and carotid vascular calcification Skull: Normal. Negative for fracture or focal lesion. Sinuses/Orbits: Postsurgical changes of the maxillary sinuses. Mild mucosal thickening Other: None CT CERVICAL SPINE FINDINGS Alignment: Straightening of the cervical spine. No subluxation. Facet alignment is within normal limits. Skull base and vertebrae: No acute fracture. No primary bone lesion or focal pathologic process. Soft tissues and spinal canal: No prevertebral fluid or swelling. No visible canal hematoma. Disc levels: Multilevel degenerative changes. Advanced disc space narrowing C4 through C7. Facet degenerative changes at multiple levels with multiple level foraminal stenosis. Advanced C1-C2 degenerative change Upper chest: Negative. Other: None IMPRESSION: No CT evidence for acute intracranial abnormality. Atrophy and chronic small vessel ischemic changes of the white matter. Chronic right MCA infarcts. Straightening of the cervical spine with degenerative changes. No acute osseous abnormality. Electronically Signed   By: Jasmine Pang M.D.   On: 08/31/2022 03:19    Microbiology: No results found for this or any previous visit (from the past 240 hour(s)).   Labs: Basic Metabolic Panel: Recent Labs  Lab 09/10/22 0435 09/11/22 0607 09/12/22 0530  09/13/22 0438 09/14/22 0556  NA 136 137 132* 137 138  K 4.1 4.4 4.1 4.5 4.2  CL 104 104 99 103 103  CO2 27 27 25 30 30   GLUCOSE 148* 107* 107* 97 112*  BUN 26* 26* 22 28* 35*  CREATININE 0.98 0.77 0.83 0.91 0.93  CALCIUM 7.9* 7.8* 8.1* 8.5* 8.6*  MG  --  1.9 1.9 2.1 2.3  PHOS  --  2.4* 2.9 4.1 2.8   Liver Function Tests: Recent Labs  Lab 09/10/22 0435 09/11/22 0607 09/12/22 0530 09/13/22 0438 09/14/22 0556  AST 25 26 23 24 28   ALT 20 15 14 15 17   ALKPHOS 68 71 75 78 80  BILITOT 0.6 0.7 1.1 0.9 1.1  PROT 5.1* 4.9* 5.2* 5.4* 5.6*  ALBUMIN 2.5* 2.5* 2.5* 2.6* 3.3*   No results for input(s):  "LIPASE", "AMYLASE" in the last 168 hours. No results for input(s): "AMMONIA" in the last 168 hours. CBC: Recent Labs  Lab 09/09/22 1104 09/10/22 0435 09/11/22 0607 09/12/22 0530 09/13/22 0438 09/14/22 0556  WBC 8.8 10.9* 10.7* 10.9* 8.1 8.0  NEUTROABS 5.9  --  8.5* 8.3* 5.7 5.8  HGB 10.3* 8.9* 8.5* 8.7* 9.0* 8.1*  HCT 32.6* 27.7* 26.4* 27.3* 28.5* 25.3*  MCV 94.5 93.3 94.3 93.5 94.7 94.8  PLT 350 316 337 367 413* 391   Cardiac Enzymes: No results for input(s): "CKTOTAL", "CKMB", "CKMBINDEX", "TROPONINI" in the last 168 hours. BNP: BNP (last 3 results) No results for input(s): "BNP" in the last 8760 hours.  ProBNP (last 3 results) No results for input(s): "PROBNP" in the last 8760 hours.  CBG: Recent Labs  Lab 09/13/22 2338 09/14/22 0414 09/14/22 0759 09/14/22 1148 09/14/22 1247  GLUCAP 98 107* 111* 130* 104*       Signed:  Carolyne Littles, MD [facility]

## 2022-09-14 NOTE — TOC Progression Note (Signed)
Transition of Care (TOC) - Progression Note    Patient Details  Name: Chad Drey Degeorge Sr. MRN: [id number] Date of Birth: 11-25-1933  Transition of Care [facility]) CM/SW Contact  Marlowe Sax, RN Phone Number: 09/14/2022, 2:54 PM  Clinical Narrative:    Going to [facility], I called his son Anuel and notified of the dc and room number, EMS called, he is the 4th one on the west side list   Expected Discharge Plan: Skilled Nursing Facility Barriers to Discharge: Continued Medical Work up  Expected Discharge Plan and Services       Living arrangements for the past 2 months: Assisted Living Facility Expected Discharge Date: 09/14/22                                     Social Determinants of Health (SDOH) Interventions SDOH Screenings   Food Insecurity: No Food Insecurity (09/09/2022)  Housing: Low Risk  (09/09/2022)  Transportation Needs: No Transportation Needs (09/09/2022)  Utilities: Not At Risk (09/09/2022)  Tobacco Use: Medium Risk (09/10/2022)    Readmission Risk Interventions     No data to display

## 2022-09-14 NOTE — Progress Notes (Addendum)
   Subjective: 5 Days Post-Op Procedure(s) (LRB): INTRAMEDULLARY (IM) NAIL INTERTROCHANTERIC (Left) Patient reports pain as mild.   Patient is well, and has had no acute complaints or problems We will continue with physical therapy today.  Plan is to go Skilled nursing facility after hospital stay.  Objective: Vital signs in last 24 hours: Temp:  [98 F (36.7 C)-98.2 F (36.8 C)] 98 F (36.7 C) (05/14 0831) Pulse Rate:  [81-93] 81 (05/13 2338) Resp:  [16-20] 16 (05/14 0831) BP: (95-103)/(48-56) 97/54 (05/14 0831) SpO2:  [94 %-100 %] 96 % (05/14 0831)  Intake/Output from previous day: 05/13 0701 - 05/14 0700 In: 261.1 [I.V.:261.1] Out: 500 [Urine:500] Intake/Output this shift: No intake/output data recorded.  Recent Labs    09/12/22 0530 09/13/22 0438 09/14/22 0556  HGB 8.7* 9.0* 8.1*   Recent Labs    09/13/22 0438 09/14/22 0556  WBC 8.1 8.0  RBC 3.01*  2.99* 2.67*  HCT 28.5* 25.3*  PLT 413* 391   Recent Labs    09/13/22 0438 09/14/22 0556  NA 137 138  K 4.5 4.2  CL 103 103  CO2 30 30  BUN 28* 35*  CREATININE 0.91 0.93  GLUCOSE 97 112*  CALCIUM 8.5* 8.6*   No results for input(s): "LABPT", "INR" in the last 72 hours.  EXAM General - Patient is Alert, Appropriate, and Confused Extremity - Neurovascular intact Sensation intact distally Intact pulses distally Dorsiflexion/Plantar flexion intact No cellulitis present Compartment soft Mild ecchymosis, imoproving Dressing - dressing C/D/I and no drainage Motor Function - intact, moving foot and toes well on exam.   Past Medical History:  Diagnosis Date   Coronary artery disease    Depression    Diabetes mellitus without complication (HCC)    Seizures (HCC)    Stroke (HCC)    Vascular dementia (HCC)     Assessment/Plan:   5 Days Post-Op Procedure(s) (LRB): INTRAMEDULLARY (IM) NAIL INTERTROCHANTERIC (Left) Principal Problem:   Hip fracture (HCC) Active Problems:   Diabetes mellitus type 2,  uncomplicated (HCC)   Vascular dementia (HCC)   CAD S/P percutaneous coronary angioplasty   BPH (benign prostatic hyperplasia)   Closed displaced intertrochanteric fracture of left femur (HCC)   Malnutrition of moderate degree  Estimated body mass index is 18.23 kg/m as calculated from the following:   Height as of this encounter: 5\' 9"  (1.753 m).   Weight as of this encounter: 56 kg. Advance diet Up with therapy  Vital signs are stable, BP soft.    Pain well-controlled  Acute on chronic postop blood loss anemia -hemoglobin 8.1, stable.  Care management to assist with discharge to skilled nursing facility.   Patient will need 2-week follow-up with [facility] orthopedics for staple removal and x-rays. Lovenox 40 mg subcu daily x 14 days at discharge Weightbearing as tolerated left lower extremity Patient can remove honeycomb dressing 1 week postop and continue showering/bathing with no dressing   DVT Prophylaxis - Lovenox, TED hose, and SCDs Weight-Bearing as tolerated to left leg   T. Cranston Neighbor, PA-C [facility] 09/14/2022, 12:53 PM  Patient seen and examined, agree with above plan.  The patient is doing well status post left intertrochanteric femur fracture, no concerns at this time.  Pain is controlled.  Reinaldo Berber MD

## 2022-11-14 ENCOUNTER — Other Ambulatory Visit: Payer: Self-pay

## 2022-11-14 ENCOUNTER — Emergency Department: Payer: Medicare Other

## 2022-11-14 ENCOUNTER — Emergency Department
Admission: EM | Admit: 2022-11-14 | Discharge: 2022-11-15 | Disposition: A | Discharge status: Skilled Nursing Facility | Payer: Medicare Other | Source: Home / Self Care

## 2022-11-14 DIAGNOSIS — S0990XA Unspecified injury of head, initial encounter: Secondary | ICD-10-CM | POA: Insufficient documentation

## 2022-11-14 DIAGNOSIS — Z043 Encounter for examination and observation following other accident: Secondary | ICD-10-CM | POA: Diagnosis not present

## 2022-11-14 DIAGNOSIS — W19XXXA Unspecified fall, initial encounter: Secondary | ICD-10-CM

## 2022-11-14 DIAGNOSIS — W0110XA Fall on same level from slipping, tripping and stumbling with subsequent striking against unspecified object, initial encounter: Secondary | ICD-10-CM | POA: Diagnosis not present

## 2022-11-14 DIAGNOSIS — I6782 Cerebral ischemia: Secondary | ICD-10-CM | POA: Diagnosis not present

## 2022-11-14 NOTE — ED Triage Notes (Signed)
Patient brought in by EMS from [facility] for fall; EMS reports that staff found patient half on his bed and half on the floor; Patient is not on blood thinners, denies pain at this time

## 2022-11-14 NOTE — ED Notes (Signed)
called to [facility] for pt transport to facility/[facility]/rep:allison .

## 2022-11-14 NOTE — ED Notes (Signed)
Family updated as to patient's status. Pt son notified of discharge

## 2022-11-14 NOTE — ED Provider Notes (Signed)
[facility] Provider Note  Patient Contact: 10:40 PM (approximate)   History   Fall (Patient brought in by EMS from [facility] for fall; EMS reports that staff found patient half on his bed and half on the floor; Patient is not on blood thinners, denies pain at this time)   HPI  Chad STATEN Sr. is a 89 y.o. male who presents the emergency department after falling out of bed.  Patient reports that he was try to get out of bed and he fell and hit his head.  Patient denies any pain complaints.  Patient states that his head does not hurt, he has no musculoskeletal pain.  Patient is moving all extremities appropriately at this time.  He does have a history of dementia and does have poor reasoning for falling.  Patient is unable to provide other details about his medical history but does report that he hit his head.  CT scan was obtained out of triage.  Patient is moving extremities at this time.  Denies any pain.  Patient brought in by EMS and reports that patient is at his baseline but facility wanted patient evaluated due to the fall.     Physical Exam   Triage Vital Signs: ED Triage Vitals  Encounter Vitals Group     BP 11/14/22 2036 (!) 116/52     Systolic BP Percentile --      Diastolic BP Percentile --      Pulse Rate 11/14/22 2036 60     Resp 11/14/22 2036 19     Temp 11/14/22 2036 98.5 F (36.9 C)     Temp Source 11/14/22 2036 Oral     SpO2 11/14/22 2036 94 %     Weight --      Height --      Head Circumference --      Peak Flow --      Pain Score 11/14/22 2037 0     Pain Loc --      Pain Education --      Exclude from Growth Chart --     Most recent vital signs: Vitals:   11/14/22 2036  BP: (!) 116/52  Pulse: 60  Resp: 19  Temp: 98.5 F (36.9 C)  SpO2: 94%     General: Alert and in no acute distress. Eyes:  PERRL. EOMI. Head: No acute traumatic findings  Neck: No stridor. No cervical spine tenderness to palpation.   Cardiovascular:  Good peripheral perfusion Respiratory: Normal respiratory effort without tachypnea or retractions. Lungs CTAB. Good air entry to the bases with no decreased or absent breath sounds. Musculoskeletal: Full range of motion to all extremities.  Patient is nontender palpation over the osseous structures of the upper or lower extremities.  Moving bilateral lower extremities actively at this time with no tenderness.  No visible signs of trauma.  No shortening or rotation of any extremity. Neurologic:  No gross focal neurologic deficits are appreciated.  Skin:   No rash noted Other:   ED Results / Procedures / Treatments   Labs (all labs ordered are listed, but only abnormal results are displayed) Labs Reviewed - No data to display   EKG     RADIOLOGY  I personally viewed, evaluated, and interpreted these images as part of my medical decision making, as well as reviewing the written report by the radiologist.  ED Provider Interpretation:   CT Head Wo Contrast  Result Date: 11/14/2022 CLINICAL DATA:  Head trauma  EXAM: CT HEAD WITHOUT CONTRAST TECHNIQUE: Contiguous axial images were obtained from the base of the skull through the vertex without intravenous contrast. RADIATION DOSE REDUCTION: This exam was performed according to the departmental dose-optimization program which includes automated exposure control, adjustment of the mA and/or kV according to patient size and/or use of iterative reconstruction technique. COMPARISON:  CT brain 09/09/2022 FINDINGS: Brain: No acute territorial infarction, hemorrhage or intracranial mass. Chronic right MCA infarct with encephalomalacia as before. Mild ex vacuo dilatation of right lateral ventricle. Mild chronic small vessel ischemic changes of the white matter. Atrophy. Vascular: No hyperdense vessels. Vertebral and carotid vascular calcification Skull: Normal. Negative for fracture or focal lesion. Sinuses/Orbits: Patchy mucosal  thickening in the sinuses Other: None IMPRESSION: 1. No CT evidence for acute intracranial abnormality. 2. Chronic right MCA infarct. Atrophy and chronic small vessel ischemic changes of the white matter. Electronically Signed   By: Jasmine Pang M.D.   On: 11/14/2022 21:21    PROCEDURES:  Critical Care performed: No  Procedures   MEDICATIONS ORDERED IN ED: Medications - No data to display   IMPRESSION / MDM / ASSESSMENT AND PLAN / ED COURSE  I reviewed the triage vital signs and the nursing notes.                                 Differential diagnosis includes, but is not limited to, fall, head injury, musculoskeletal injury   Patient's presentation is most consistent with acute presentation with potential threat to life or bodily function.   Patient's diagnosis is consistent with fall.  Patient presents to the emergency department after falling out of his bed.  Patient was found halfway out of the bed and halfway on the floor.  Patient states that he hit his head, denies any pain complaints.  Moving extremities at this time.  No tenderness with palpation.  No palpable abnormality.  No shortening or rotation of either lower extremity.  CT scan was ordered from triage given the report that he did hit his head.  Patient was at baseline and is demented.  At this time as patient is moving all extremities appropriately, has no pain complaints, no concerning findings on exam and CT is reassuring patient will be discharged back to facility..  Patient is given ED precautions to return to the ED for any worsening or new symptoms.     FINAL CLINICAL IMPRESSION(S) / ED DIAGNOSES   Final diagnoses:  Fall, initial encounter     Rx / DC Orders   ED Discharge Orders     None        Note:  This document was prepared using Dragon voice recognition software and may include unintentional dictation errors.   Lanette Hampshire 11/14/22 2304    Dionne Bucy,  MD 11/15/22 [phone]

## 2022-12-15 ENCOUNTER — Other Ambulatory Visit: Payer: Self-pay

## 2022-12-15 ENCOUNTER — Emergency Department: Payer: Medicare Other

## 2022-12-15 ENCOUNTER — Encounter: Payer: Self-pay | Admitting: *Deleted

## 2022-12-15 DIAGNOSIS — S0081XA Abrasion of other part of head, initial encounter: Secondary | ICD-10-CM | POA: Diagnosis not present

## 2022-12-15 DIAGNOSIS — W06XXXA Fall from bed, initial encounter: Secondary | ICD-10-CM | POA: Insufficient documentation

## 2022-12-15 DIAGNOSIS — F0390 Unspecified dementia without behavioral disturbance: Secondary | ICD-10-CM | POA: Insufficient documentation

## 2022-12-15 DIAGNOSIS — S0990XA Unspecified injury of head, initial encounter: Secondary | ICD-10-CM | POA: Diagnosis present

## 2022-12-15 LAB — BASIC METABOLIC PANEL
Anion gap: 8 (ref 5–15)
BUN: 25 mg/dL — ABNORMAL HIGH (ref 8–23)
CO2: 26 mmol/L (ref 22–32)
Calcium: 8.5 mg/dL — ABNORMAL LOW (ref 8.9–10.3)
Chloride: 104 mmol/L (ref 98–111)
Creatinine, Ser: 0.87 mg/dL (ref 0.61–1.24)
GFR, Estimated: >60 mL/min (ref >60)
Glucose, Bld: 101 mg/dL — ABNORMAL HIGH (ref 70–99)
Potassium: 4 mmol/L (ref 3.5–5.1)
Sodium: 138 mmol/L (ref 135–145)

## 2022-12-15 LAB — CBC
HCT: 36.2 % — ABNORMAL LOW (ref 39.0–52.0)
Hemoglobin: 11.4 g/dL — ABNORMAL LOW (ref 13.0–17.0)
MCH: 29.2 pg (ref 26.0–34.0)
MCHC: 31.5 g/dL (ref 30.0–36.0)
MCV: 92.8 fL (ref 80.0–100.0)
Platelets: 192 10*3/uL (ref 150–400)
RBC: 3.9 MIL/uL — ABNORMAL LOW (ref 4.22–5.81)
RDW: 14.5 % (ref 11.5–15.5)
WBC: 6.5 10*3/uL (ref 4.0–10.5)
nRBC: 0 % (ref 0.0–0.2)

## 2022-12-15 LAB — TROPONIN I (HIGH SENSITIVITY)
Troponin I (High Sensitivity): 9 ng/L (ref <18)
Troponin I (High Sensitivity): 13 ng/L (ref <18)

## 2022-12-15 NOTE — ED Triage Notes (Signed)
Pt was brought in by [facility] due to fall, pt reports that he was standing next to bed when he fell forward and struck his forehead, no LOC.  Pt denies LOC or neck pain.  Pt reports generalized weakness and ems reports dementia but pt "is at baseline"

## 2022-12-16 ENCOUNTER — Emergency Department
Admission: EM | Admit: 2022-12-16 | Discharge: 2022-12-16 | Disposition: A | Discharge status: Home / Self Care | Payer: Medicare Other | Attending: Emergency Medicine | Admitting: Emergency Medicine

## 2022-12-16 DIAGNOSIS — W19XXXA Unspecified fall, initial encounter: Secondary | ICD-10-CM

## 2022-12-16 DIAGNOSIS — S0081XA Abrasion of other part of head, initial encounter: Secondary | ICD-10-CM

## 2022-12-16 DIAGNOSIS — S0990XA Unspecified injury of head, initial encounter: Secondary | ICD-10-CM

## 2022-12-16 MED ORDER — BACITRACIN ZINC 500 UNIT/GM EX OINT
TOPICAL_OINTMENT | Freq: Once | CUTANEOUS | Status: AC
  Administered 2022-12-16: 1 via TOPICAL
  Filled 2022-12-16: qty 1.8

## 2022-12-16 NOTE — ED Provider Notes (Signed)
[name] Note    Event Date/Time   First MD Initiated Contact with Patient 12/16/22 0045     (approximate)   History   Fall   HPI  Chad [name] Sr. is a 89 y.o. male who presents to the ED for evaluation of Fall   Patient with dementia presents from his SNF after falling and striking his forehead.  He is pleasantly disoriented.  He reports he was constipated a week or 2 ago.  Reports he feels fine right now.   Physical Exam   Triage Vital Signs: ED Triage Vitals  Encounter Vitals Group     BP 12/15/22 1824 (!) 175/66     Systolic BP Percentile --      Diastolic BP Percentile --      Pulse Rate 12/15/22 1824 (!) 50     Resp 12/15/22 1824 14     Temp 12/15/22 2327 (!) 97.5 F (36.4 C)     Temp Source 12/15/22 2327 Axillary     SpO2 12/15/22 1824 100 %     Weight --      Height --      Head Circumference --      Peak Flow --      Pain Score 12/15/22 1826 4     Pain Loc --      Pain Education --      Exclude from Growth Chart --     Most recent vital signs: Vitals:   12/16/22 0200 12/16/22 0230  BP: (!) 145/59 (!) 150/63  Pulse: (!) 58 (!) 103  Resp:    Temp:    SpO2: 96% 92%    General: Awake, no distress.  CV:  Good peripheral perfusion.  Resp:  Normal effort.  Abd:  No distention.  MSK:  No deformity noted.  Palpation of all 4 extremities without evidence of deformity, tenderness or acute trauma.  No signs of trauma to the back.  Seems to have an old skin tear with Xeroform over it over his left lateral elbow without acute superimposed infectious features. Neuro:  No focal deficits appreciated. Other:  Superficial abrasion to the forehead superiorly at the hairline.  No discrete laceration, bony step-offs or signs of EOM entrapment.   ED Results / Procedures / Treatments   Labs (all labs ordered are listed, but only abnormal results are displayed) Labs Reviewed  CBC - Abnormal; Notable for the following  components:      Result Value   RBC 3.90 (*)    Hemoglobin 11.4 (*)    HCT 36.2 (*)    All other components within normal limits  BASIC METABOLIC PANEL - Abnormal; Notable for the following components:   Glucose, Bld 101 (*)    BUN 25 (*)    Calcium 8.5 (*)    All other components within normal limits  URINALYSIS, ROUTINE W REFLEX MICROSCOPIC  TROPONIN I (HIGH SENSITIVITY)  TROPONIN I (HIGH SENSITIVITY)    EKG Sinus rhythm with a rate of 50 bpm.  Normal axis.  1 PAC.  No STEMI.  RADIOLOGY CT head interpreted by me without evidence of acute intracranial pathology CT cervical spine interpreted by me without evidence of fracture or dislocation  Official radiology report(s): CT Head Wo Contrast  Result Date: 12/15/2022 CLINICAL DATA:  Fall, struck forehead blunt facial trauma EXAM: CT HEAD WITHOUT CONTRAST CT CERVICAL SPINE WITHOUT CONTRAST TECHNIQUE: Multidetector CT imaging of the head and cervical spine was performed following the  standard protocol without intravenous contrast. Multiplanar CT image reconstructions of the cervical spine were also generated. RADIATION DOSE REDUCTION: This exam was performed according to the departmental dose-optimization program which includes automated exposure control, adjustment of the mA and/or kV according to patient size and/or use of iterative reconstruction technique. COMPARISON:  CT brain and cervical spine 08/31/2022, CT brain 11/14/2022 FINDINGS: CT HEAD FINDINGS Brain: No acute territorial infarction, hemorrhage or intracranial mass. Remote right MCA infarcts with encephalomalacia, similar in distribution compared to prior. Mild ex vacuo dilatation of the right lateral ventricle. Moderate atrophy. Stable ventricular size. Moderate chronic small vessel ischemic change of the white matter Vascular: No hyperdense vessels. Vertebral and carotid vascular calcification Skull: Normal. Negative for fracture or focal lesion. Sinuses/Orbits: Moderate mucosal  thickening in the sinuses Other: None CT CERVICAL SPINE FINDINGS Alignment: Mild reversal of cervical lordosis. No subluxation. Facet alignment is maintained. Skull base and vertebrae: No acute fracture. No primary bone lesion or focal pathologic process. Soft tissues and spinal canal: No prevertebral fluid or swelling. No visible canal hematoma. Disc levels: Multilevel degenerative change. Advanced disc space narrowing C4 through C7. Facet degenerative changes at multiple levels with foraminal narrowing. Advanced C1-C2 degenerative change Upper chest: Negative. Other: None IMPRESSION: 1. No CT evidence for acute intracranial abnormality. Atrophy and chronic small vessel ischemic changes of the white matter. Remote right MCA infarcts. 2. Reversal of cervical lordosis with multilevel degenerative change. No acute osseous abnormality. Electronically Signed   By: Jasmine Pang M.D.   On: 12/15/2022 20:13   CT Cervical Spine Wo Contrast  Result Date: 12/15/2022 CLINICAL DATA:  Fall, struck forehead blunt facial trauma EXAM: CT HEAD WITHOUT CONTRAST CT CERVICAL SPINE WITHOUT CONTRAST TECHNIQUE: Multidetector CT imaging of the head and cervical spine was performed following the standard protocol without intravenous contrast. Multiplanar CT image reconstructions of the cervical spine were also generated. RADIATION DOSE REDUCTION: This exam was performed according to the departmental dose-optimization program which includes automated exposure control, adjustment of the mA and/or kV according to patient size and/or use of iterative reconstruction technique. COMPARISON:  CT brain and cervical spine 08/31/2022, CT brain 11/14/2022 FINDINGS: CT HEAD FINDINGS Brain: No acute territorial infarction, hemorrhage or intracranial mass. Remote right MCA infarcts with encephalomalacia, similar in distribution compared to prior. Mild ex vacuo dilatation of the right lateral ventricle. Moderate atrophy. Stable ventricular size.  Moderate chronic small vessel ischemic change of the white matter Vascular: No hyperdense vessels. Vertebral and carotid vascular calcification Skull: Normal. Negative for fracture or focal lesion. Sinuses/Orbits: Moderate mucosal thickening in the sinuses Other: None CT CERVICAL SPINE FINDINGS Alignment: Mild reversal of cervical lordosis. No subluxation. Facet alignment is maintained. Skull base and vertebrae: No acute fracture. No primary bone lesion or focal pathologic process. Soft tissues and spinal canal: No prevertebral fluid or swelling. No visible canal hematoma. Disc levels: Multilevel degenerative change. Advanced disc space narrowing C4 through C7. Facet degenerative changes at multiple levels with foraminal narrowing. Advanced C1-C2 degenerative change Upper chest: Negative. Other: None IMPRESSION: 1. No CT evidence for acute intracranial abnormality. Atrophy and chronic small vessel ischemic changes of the white matter. Remote right MCA infarcts. 2. Reversal of cervical lordosis with multilevel degenerative change. No acute osseous abnormality. Electronically Signed   By: Jasmine Pang M.D.   On: 12/15/2022 20:13    PROCEDURES and INTERVENTIONS:  Procedures  Medications  bacitracin ointment (1 Application Topical Given 12/16/22 0201)     IMPRESSION / MDM / ASSESSMENT AND PLAN /  ED COURSE  I reviewed the triage vital signs and the nursing notes.  Differential diagnosis includes, but is not limited to, skull fracture, ICH, stroke or seizure, metabolic encephalopathy, sepsis, symptomatic anemia  {Patient presents with symptoms of an acute illness or injury that is potentially life-threatening.  Pleasant and disoriented patient presents from his SNF after a fall with head abrasion.  He look systemically well and has no acute complaints.  No signs of trauma acutely beyond his forehead where he has a superficial abrasion.  CT imaging, as above is reassuring.  Serum workup is benign with a  normal metabolic panel, troponin.  Mild normocytic anemia is noted without acute bleeding signs or symptoms.  No dysrhythmias to suggest cardiogenic syncope.  We cleaned up his abrasion, apply bacitracin and he is suitable for outpatient management.      FINAL CLINICAL IMPRESSION(S) / ED DIAGNOSES   Final diagnoses:  Fall, initial encounter  Injury of head, initial encounter  Forehead abrasion, initial encounter     Rx / DC Orders   ED Discharge Orders     None        Note:  This document was prepared using Dragon voice recognition software and may include unintentional dictation errors.   Delton [place], MD 12/16/22 [phone]
